# Patient Record
Sex: Male | Born: 1935 | Race: Black or African American | Hispanic: No | Smoking: Never smoker
Health system: Southern US, Community
[De-identification: ages and names within clinical notes are randomized; demographics above are authoritative.]

## PROBLEM LIST (undated history)

## (undated) DIAGNOSIS — N189 Chronic kidney disease, unspecified: Secondary | ICD-10-CM

## (undated) DIAGNOSIS — R809 Proteinuria, unspecified: Secondary | ICD-10-CM

## (undated) DIAGNOSIS — Z973 Presence of spectacles and contact lenses: Secondary | ICD-10-CM

## (undated) DIAGNOSIS — F0280 Dementia in other diseases classified elsewhere without behavioral disturbance: Secondary | ICD-10-CM

## (undated) DIAGNOSIS — Z87448 Personal history of other diseases of urinary system: Secondary | ICD-10-CM

## (undated) DIAGNOSIS — N19 Unspecified kidney failure: Secondary | ICD-10-CM

## (undated) DIAGNOSIS — R319 Hematuria, unspecified: Secondary | ICD-10-CM

## (undated) DIAGNOSIS — E785 Hyperlipidemia, unspecified: Secondary | ICD-10-CM

## (undated) DIAGNOSIS — I4891 Unspecified atrial fibrillation: Secondary | ICD-10-CM

## (undated) DIAGNOSIS — M1990 Unspecified osteoarthritis, unspecified site: Secondary | ICD-10-CM

## (undated) DIAGNOSIS — Z77090 Contact with and (suspected) exposure to asbestos: Secondary | ICD-10-CM

## (undated) DIAGNOSIS — Z8673 Personal history of transient ischemic attack (TIA), and cerebral infarction without residual deficits: Secondary | ICD-10-CM

## (undated) DIAGNOSIS — E7800 Pure hypercholesterolemia, unspecified: Secondary | ICD-10-CM

## (undated) DIAGNOSIS — R300 Dysuria: Secondary | ICD-10-CM

## (undated) DIAGNOSIS — Z86010 Personal history of colonic polyps: Secondary | ICD-10-CM

## (undated) DIAGNOSIS — Z860101 Personal history of adenomatous and serrated colon polyps: Secondary | ICD-10-CM

## (undated) DIAGNOSIS — D631 Anemia in chronic kidney disease: Secondary | ICD-10-CM

## (undated) DIAGNOSIS — Z8719 Personal history of other diseases of the digestive system: Secondary | ICD-10-CM

## (undated) DIAGNOSIS — N182 Chronic kidney disease, stage 2 (mild): Secondary | ICD-10-CM

## (undated) DIAGNOSIS — I10 Essential (primary) hypertension: Secondary | ICD-10-CM

## (undated) DIAGNOSIS — N2581 Secondary hyperparathyroidism of renal origin: Secondary | ICD-10-CM

## (undated) DIAGNOSIS — Z940 Kidney transplant status: Secondary | ICD-10-CM

## (undated) DIAGNOSIS — K5790 Diverticulosis of intestine, part unspecified, without perforation or abscess without bleeding: Secondary | ICD-10-CM

## (undated) DIAGNOSIS — Z85528 Personal history of other malignant neoplasm of kidney: Secondary | ICD-10-CM

## (undated) DIAGNOSIS — Z8619 Personal history of other infectious and parasitic diseases: Secondary | ICD-10-CM

## (undated) DIAGNOSIS — N433 Hydrocele, unspecified: Secondary | ICD-10-CM

## (undated) DIAGNOSIS — G309 Alzheimer's disease, unspecified: Secondary | ICD-10-CM

## (undated) HISTORY — DX: Pure hypercholesterolemia, unspecified: E78.00

## (undated) HISTORY — DX: Alzheimer's disease, unspecified: G30.9

## (undated) HISTORY — DX: Dementia in other diseases classified elsewhere without behavioral disturbance: F02.80

## (undated) HISTORY — PX: KIDNEY TRANSPLANT: SHX239

## (undated) HISTORY — DX: Unspecified kidney failure: N19

## (undated) HISTORY — DX: Unspecified atrial fibrillation: I48.91

---

## 1993-09-11 HISTORY — PX: HEMICOLECTOMY: SHX854

## 1998-01-19 ENCOUNTER — Other Ambulatory Visit: Admission: RE | Admit: 1998-01-19 | Discharge: 1998-01-19 | Payer: Self-pay | Admitting: *Deleted

## 1998-04-05 ENCOUNTER — Other Ambulatory Visit: Admission: RE | Admit: 1998-04-05 | Discharge: 1998-04-05 | Payer: Self-pay | Admitting: *Deleted

## 1998-06-21 ENCOUNTER — Ambulatory Visit (HOSPITAL_COMMUNITY): Admission: RE | Admit: 1998-06-21 | Discharge: 1998-06-21 | Payer: Self-pay | Admitting: Gastroenterology

## 1998-11-12 ENCOUNTER — Emergency Department (HOSPITAL_COMMUNITY): Admission: EM | Admit: 1998-11-12 | Discharge: 1998-11-12 | Payer: Self-pay | Admitting: Internal Medicine

## 1998-11-12 ENCOUNTER — Encounter: Payer: Self-pay | Admitting: Internal Medicine

## 1998-11-18 ENCOUNTER — Ambulatory Visit (HOSPITAL_BASED_OUTPATIENT_CLINIC_OR_DEPARTMENT_OTHER): Admission: RE | Admit: 1998-11-18 | Discharge: 1998-11-18 | Payer: Self-pay | Admitting: Orthopedic Surgery

## 1999-06-21 ENCOUNTER — Encounter: Payer: Self-pay | Admitting: Vascular Surgery

## 1999-06-21 ENCOUNTER — Ambulatory Visit (HOSPITAL_COMMUNITY): Admission: RE | Admit: 1999-06-21 | Discharge: 1999-06-21 | Payer: Self-pay | Admitting: Vascular Surgery

## 1999-07-18 ENCOUNTER — Encounter: Payer: Self-pay | Admitting: Emergency Medicine

## 1999-07-19 ENCOUNTER — Inpatient Hospital Stay (HOSPITAL_COMMUNITY): Admission: EM | Admit: 1999-07-19 | Discharge: 1999-07-20 | Payer: Self-pay | Admitting: Emergency Medicine

## 1999-07-19 ENCOUNTER — Encounter: Payer: Self-pay | Admitting: Emergency Medicine

## 1999-07-22 ENCOUNTER — Encounter (HOSPITAL_COMMUNITY): Admission: RE | Admit: 1999-07-22 | Discharge: 1999-10-20 | Payer: Self-pay | Admitting: Dentistry

## 2000-03-29 ENCOUNTER — Emergency Department (HOSPITAL_COMMUNITY): Admission: EM | Admit: 2000-03-29 | Discharge: 2000-03-29 | Payer: Self-pay | Admitting: *Deleted

## 2001-07-25 ENCOUNTER — Emergency Department (HOSPITAL_COMMUNITY): Admission: EM | Admit: 2001-07-25 | Discharge: 2001-07-25 | Payer: Self-pay | Admitting: Emergency Medicine

## 2001-07-25 ENCOUNTER — Encounter: Payer: Self-pay | Admitting: *Deleted

## 2001-10-13 ENCOUNTER — Encounter: Payer: Self-pay | Admitting: Emergency Medicine

## 2001-10-13 ENCOUNTER — Emergency Department (HOSPITAL_COMMUNITY): Admission: EM | Admit: 2001-10-13 | Discharge: 2001-10-13 | Payer: Self-pay | Admitting: Emergency Medicine

## 2001-10-18 ENCOUNTER — Ambulatory Visit (HOSPITAL_COMMUNITY): Admission: RE | Admit: 2001-10-18 | Discharge: 2001-10-18 | Payer: Self-pay | Admitting: Gastroenterology

## 2001-10-25 ENCOUNTER — Encounter (HOSPITAL_COMMUNITY): Admission: RE | Admit: 2001-10-25 | Discharge: 2002-01-23 | Payer: Self-pay | Admitting: Dentistry

## 2002-01-28 ENCOUNTER — Encounter (HOSPITAL_COMMUNITY): Admission: RE | Admit: 2002-01-28 | Discharge: 2002-04-28 | Payer: Self-pay | Admitting: Dentistry

## 2002-09-09 ENCOUNTER — Emergency Department (HOSPITAL_COMMUNITY): Admission: EM | Admit: 2002-09-09 | Discharge: 2002-09-09 | Payer: Self-pay | Admitting: Emergency Medicine

## 2003-05-23 ENCOUNTER — Encounter: Payer: Self-pay | Admitting: Emergency Medicine

## 2003-05-23 ENCOUNTER — Emergency Department (HOSPITAL_COMMUNITY): Admission: EM | Admit: 2003-05-23 | Discharge: 2003-05-23 | Payer: Self-pay | Admitting: Emergency Medicine

## 2005-08-30 ENCOUNTER — Inpatient Hospital Stay (HOSPITAL_COMMUNITY): Admission: AD | Admit: 2005-08-30 | Discharge: 2005-09-03 | Payer: Self-pay | Admitting: General Surgery

## 2005-08-30 ENCOUNTER — Encounter: Payer: Self-pay | Admitting: *Deleted

## 2006-06-12 ENCOUNTER — Emergency Department: Payer: Self-pay | Admitting: Emergency Medicine

## 2006-06-18 ENCOUNTER — Emergency Department: Payer: Self-pay | Admitting: Emergency Medicine

## 2008-04-26 ENCOUNTER — Emergency Department (HOSPITAL_COMMUNITY): Admission: EM | Admit: 2008-04-26 | Discharge: 2008-04-26 | Payer: Self-pay | Admitting: *Deleted

## 2011-01-27 NOTE — H&P (Signed)
[name] [name], [name] [name]                ACCOUNT NO.:  [id number]   MEDICAL RECORD NO.:  [id number]          PATIENT TYPE:  INP   LOCATION:  5508                         FACILITY:  [facility]   PHYSICIAN:  Edsel Petrin. Dalbert Batman, M.D.DATE OF BIRTH:  09-Jul-1928   DATE OF ADMISSION:  08/30/2005  DATE OF DISCHARGE:                                HISTORY & PHYSICAL   CHIEF COMPLAINT:  Abdominal pain and vomiting.   HISTORY OF PRESENT ILLNESS:  This is a 77 year old African-American man who  presents to the [facility] ED with a 1-day history of vomiting and mid and  lower abdominal pain. The pain started last night at 11 p.m. He was feeling  well before then. He has had a couple of bowel movements over the past 12  hours with no blood in it but he has mild diffuse pain.   He was evaluated by Dr. Mylinda Latina. Plain abdominal films were not  diagnostic but a CT scan suggested a distal small-bowel obstruction with a  little bit of interloop fluid. The radiologist suggested that the  obstruction was just proximal to the anastomosis between the ileum and the  tranverse colon in a patient who has had a right colectomy in the past.  There was no sign of adenopathy or malignancy. There is no sign of any  hernia detected. We were asked to see the patient after the CT scan was  done. I am evaluating the patient along with Dr. Justin Mend from the nephrology  service.   The patient is going to be admitted to [facility] because there are no  beds at [facility]. This has been arranged with Dr. Excell Seltzer who is on call for our surgery group at [facility].   PAST MEDICAL HISTORY:  1.  Status post renal transplant x3 in 1974 and 1980 with a functioning      transplant in the left iliac fossa.  2.  Status post bilateral nephrectomy for renal cell carcinoma.  3.  Hypertension.  4.  Status post right colectomy for villous adenoma in 1995.  5.  Status post colonoscopy in 2003 by Dr. Teena Irani with no  significant      findings.  6.  Hypertension.  7.  Hyperlipidemia.  8.  Chronic renal insufficiency; baseline creatinine 1.2.  9.  History of urinary tract infection with bladder stones, followed by Dr.      Desma Maxim at [facility].  10. Status post posterior circulation CVA followed by Dr. Rosiland Oz.  11. History of condylomata of the genital area with surgery by Dr. Lennie Hummer.   CURRENT MEDICATIONS:  1.  Neoral 125/100 b.i.d.  2.  Prednisone 5 mg daily.  3.  Imuran 150 mg daily.  4.  Aspirin 81 mg daily.  5.  Lasix 40 mg daily.  6.  Cozaar 50 mg daily.  7.  Caduet 5/20 mg daily.   ALLERGIES:  1.  CIPRO.  2.  SEPTRA.  3.  LEVAQUIN.  4.  CLEOCIN.   REVIEW OF SYSTEMS:  Systems reviewed. They are  noncontributory except as  described above.   SOCIAL HISTORY:  He is a widower. No tobacco or alcohol use. Three children.   FAMILY HISTORY:  No history of renal disease in other members of the family.   PHYSICAL EXAMINATION:  GENERAL:  Elderly, thin, pleasant, cooperative black  man in mild distress. He does not appear toxic.  VITAL SIGNS:  Blood pressure 134/86, respirations 20, pulse 70, temperature  98.3.  HEENT:  Eyes:  Sclerae clear, extraocular movements intact. Ear, nose,  mouth, and throat:  Dry mucous membranes, no acute lesions.  HEART:  Regular rate and rhythm, no murmurs.  LUNGS:  Clear to auscultation, no chest wall tenderness.  ABDOMEN:  He has multiple scars including a midline scar and bilateral  oblique scars in the right lower quadrant and left lower quadrant. He does  not appear visibly distended. I do not feel a hernia in the abdomen or the  groin, although he does have a large right hydrocele. Bowel sounds are  present but are diminished. He has mild diffuse tenderness. No peritoneal  signs.  EXTREMITIES:  He does move all four extremities. No edema.  NEUROLOGIC:  No gross motor or sensory deficits.   ADMISSION DATA:  CT scan as described above. White  blood cell count 10,000.  Differential 79 neutrophils, 14 lymphocytes. Hemoglobin 14.9. Liver function  tests normal. BUN 21, creatinine 1.3, sodium 133, carbon dioxide 28.  Urinalysis unremarkable.   ASSESSMENT:  1.  Small-bowel obstruction, presumably secondary to adhesions from his      multiple prior surgeries.  2.  End-stage renal disease, status post bilateral renal transplants with      functioning transplant left iliac fossa and stable renal function.  3.  Status post right colectomy for villous adenoma.  4.  Status post bilateral nephrectomies.  5.  Hypertension.  6.  Status post cerebrovascular accident.   PLAN:  1.  The patient will be admitted to [facility] under the general surgery      service with the renal service following.  2.  Insert NG tube for decompression.  3.  We will repeat labs and x-ray in the morning. If he does not improve, he      may require laparotomy within the next 24 hours.      Edsel Petrin. Dalbert Batman, M.D.  Electronically Signed     HMI/MEDQ  D:  08/30/2005  T:  08/31/2005  Job:  [id number]   cc:   Maudie Flakes. Hassell Done, M.D.  Fax: [phone]   Elyse Jarvis. Amedeo Plenty, M.D.  Fax: [phone]   Sherril Croon, M.D.  Fax: [phone]

## 2011-01-27 NOTE — Discharge Summary (Signed)
[name] [name]                ACCOUNT NO.:  [id number]   MEDICAL RECORD NO.:  [id number]          PATIENT TYPE:  INP   LOCATION:  5508                         FACILITY:  [facility]   PHYSICIAN:  Haywood Lasso, M.D.DATE OF BIRTH:  June 08, 1928   DATE OF ADMISSION:  08/30/2005  DATE OF DISCHARGE:  09/03/2005                                 DISCHARGE SUMMARY   DISCHARGE DIAGNOSES:  1.  Small-bowel obstruction, resolved.  2.  History of villous adenoma.  3.  History of right colectomy in 1995.  4.  History of bilateral native nephrectomies secondary to renal cell      carcinoma.  5.  History of renal transplants 1974, 1980.  6.  Hypertension.  7.  Hyperlipidemia.  8.  Multiple allergies to CIPRO, SEPTRA, [place] and CLEOMYCIN.   HOSPITAL COURSE:  [name] [name] is a 77 year old male who was initially seen  over at [facility] for 24-hour history of abdominal pain and  vomiting.  CT scan showed a distal small-bowel obstruction with intraloop  fluid findings.  The patient had an NG placed and then was transferred to  [facility].  Gradually over the next several days the  small-bowel obstruction improved.  While the patient was here, patient was  followed closely by the renal service who noted his significant history of  nephrectomies and transplants.  After approximately four days in the  hospital patient was feeling better and he was able to eat without problem.  His NG tube had been pulled and he was ready for discharge to home.  The  surgery team and the renal team both agreed the patient was ready for  discharge home and he was discharged in stable condition.   DISCHARGE MEDICATIONS:  1.  Neoral 100 mg two tablets once a day.  2.  Prednisone 5 mg daily.  3.  Imuran 150 mg  a day.  4.  Baby aspirin 81 mg a day.  5.  Cozaar 50 mg a day.  6.  Caduet 5/20 daily.  7.  Lasix 40 mg p.r.n.   He is to follow up with Dr. Hassell Done next month at his regular  appointment.  He  is to limit salt to 4 g a day and he is to advance his diet gradually over  the next several days.      Joesphine Bare, P.A.      Haywood Lasso, M.D.  Electronically Signed    LB/MEDQ  D:  10/25/2005  T:  10/25/2005  Job:  [id number]

## 2011-01-27 NOTE — Consult Note (Signed)
NAMECHANDARA, RORRER NO.:  [id number]   MEDICAL RECORD NO.:  [id number]          PATIENT TYPE:  EMS   LOCATION:  ED                           FACILITY:  [facility]   PHYSICIAN:  Sherril Croon, M.D.   DATE OF BIRTH:  08-23-1928   DATE OF CONSULTATION:  08/30/2005  DATE OF DISCHARGE:                                   CONSULTATION   REFERRING PHYSICIAN:  Dr. Vern Claude. Prudence Davidson.   REASON FOR CONSULTATION:  This 77 year old African American male, status  post renal transplantation x2 in 1974 and 1980, presented with a 1-day  history of vomiting x1 last night and 2 this morning with bilious emesis,  non-blood-stained, and diffuse right crampy lower abdominal pain.  Last  bowel movement was approximately 8 o'clock yesterday evening, constipated,  hard stood, denied any blood per rectum.  He has a history of decreased  appetite with weight loss over the past 6 months, history of villous  adenoma, right-sided hemicolectomy in '95 with screening colonoscopy  performed by Dr. Amedeo Plenty, scheduled for further colonoscopy in 2008, history  of renal cell cancer, status post bilateral native nephrectomies, history of  renal transplantation in 1974 and 1980.   PAST MEDICAL HISTORY:  1.  Status post renal transplant x3 in 1974 and 1980.  2.  History of bilateral nephrectomies for renal cell cancer.  3.  History of hypertension.  4.  History of hyperlipidemia.  5.  History of chronic renal failure, baseline serum creatinine 1.2.  6.  History of UTI and bladder stones, followed by Dr. Desma Maxim.  7.  History of posterior circulation strokes, followed by Dr. Rosiland Oz.  8.  History of anal condylomata, followed by Dr. Lennie Hummer.   MEDICATIONS:  1.  Neoral 125/100 mg twice daily.  2.  Prednisone 5 mg daily.  3.  Imuran 150 mg daily.  4.  Aspirin 81 mg daily.  5.  Lasix 40 mg daily.  6.  Cozaar 50 mg daily.  7.  Caduet 5/20 mg daily.   ALLERGIES:  CIPRO, SEPTRA, LEVAQUIN, BLEOMYCIN.   REVIEW OF SYSTEMS:  GENERAL:  Denies fatigue, fevers, sweats or chills.  EYES:  No visual complaints.  EARS, NOSE, MOUTH AND THROAT:  Denies hearing  loss, sore throat or runny nose.  CARDIOVASCULAR:  No anginal chest pain.  No history of myocardial infarction or congestive heart failure.  No  orthopnea.  No ankle or leg swelling, syncope or spells of palpitations.  RESPIRATORY:  No cough, wheezing or hemoptysis.  ABDOMINAL SYSTEM:  As per  HPI.  UROGENITAL:  No urgency, frequency or dysuria.  History of renal  calculi.  NEUROLOGIC:  History of posterior circulation stroke.  No history  of seizures.  No history of numbness, tingling or paresthesias in the upper  and lower extremities.  No focal complaints.  MUSCULOSKELETAL:  No [place] of  nonsteroidal anti-inflammatory drugs.  Has DJD in the upper extremities.  DERMATOLOGIC:  Denies any skin rashes or itching.   SOCIAL HISTORY:  Widowed.  No tobacco.  No alcohol.  Three children.   FAMILY  HISTORY:  No end-stage renal disease.   PHYSICAL EXAMINATION:  GENERAL:  Alert, nontoxic-appearing.  VITAL SIGNS:  Blood pressure 120/80, pulse 70, temperature 98.3, saturations  were 100%.  HEENT:  Head and eyes pale.  Extraocular movements intact.  Pupils round,  equal, reactive.  Ears, nose, mouth and throat:  General appearance was  normal.  Nasal mucosa clear.  Oropharynx was clear.  NECK:  Supple.  No JVD, no thyromegaly, no adenopathy.  CARDIOVASCULAR:  No heaves, thrills, rubs or gallops, regular rate and  rhythm.  RESPIRATORY:  Clear to auscultation, no wheeze or rales.  ABDOMEN:  Diffuse tenderness.  No rebound or guarding.  Bowel sounds high-  pitched.  GENITAL:  Scrotal evaluation reveals some swelling which was consistent with  hydrocele.  Spermatic cord easily palpable.  RECTAL:  Heme-negative.  EXTREMITIES:  No cyanosis, clubbing or edema.  Peripheral pulses 2+ and  symmetric, right and left dorsalis pedis.   LABORATORY DATA:   Urinalysis negative.  WBC 10,000, hemoglobin 14.9,  platelets 212,000.  Sodium 133, potassium 3.7, chloride 101, CO2 28, BUN 21,  creatinine 1.3, glucose 138, calcium 8.8.   X-RAYS:  COPD on chest x-ray, bibasilar atelectasis, nonspecific bowel  loops, no perforation.   CT scan revealed obstruction.   ASSESSMENT AND PLAN:  1.  Status post renal transplantation __________ allograft function.  2.  History of depression.  Continue prednisone 50 mg and hydrocortisone q.8      h., hold off on Imuran and Neoral.  Continue to be n.p.o.  Use suction.      If still nothing-by-mouth in 24 hours, give dose of Xopenex in the      morning, 1.5 mg/kg, maximum dose of 150 mg intravenously if still      nothing-by-mouth in 1-2 days.  3.  Hypertension/volume:  Continue intravenous fluids of D-5 1/2 normal      saline at 100 mL/hr.  I will follow renal panel and electrolytes.  4.  Small-bowel obstruction:  I appreciate help from Dr. Fanny Skates.      Sherril Croon, M.D.  Electronically Signed     MWW/MEDQ  D:  08/30/2005  T:  09/01/2005  Job:  [id number]

## 2011-11-07 DIAGNOSIS — Z940 Kidney transplant status: Secondary | ICD-10-CM | POA: Diagnosis not present

## 2011-11-07 DIAGNOSIS — Z79899 Other long term (current) drug therapy: Secondary | ICD-10-CM | POA: Diagnosis not present

## 2011-11-07 DIAGNOSIS — E785 Hyperlipidemia, unspecified: Secondary | ICD-10-CM | POA: Diagnosis not present

## 2011-11-16 DIAGNOSIS — Z940 Kidney transplant status: Secondary | ICD-10-CM | POA: Diagnosis not present

## 2011-11-16 DIAGNOSIS — I10 Essential (primary) hypertension: Secondary | ICD-10-CM | POA: Diagnosis not present

## 2012-05-15 DIAGNOSIS — Z79899 Other long term (current) drug therapy: Secondary | ICD-10-CM | POA: Diagnosis not present

## 2012-05-15 DIAGNOSIS — Z940 Kidney transplant status: Secondary | ICD-10-CM | POA: Diagnosis not present

## 2012-05-15 DIAGNOSIS — Z125 Encounter for screening for malignant neoplasm of prostate: Secondary | ICD-10-CM | POA: Diagnosis not present

## 2012-05-15 DIAGNOSIS — E785 Hyperlipidemia, unspecified: Secondary | ICD-10-CM | POA: Diagnosis not present

## 2012-05-23 DIAGNOSIS — I120 Hypertensive chronic kidney disease with stage 5 chronic kidney disease or end stage renal disease: Secondary | ICD-10-CM | POA: Diagnosis not present

## 2012-06-26 DIAGNOSIS — Z940 Kidney transplant status: Secondary | ICD-10-CM | POA: Diagnosis not present

## 2012-06-26 DIAGNOSIS — Z23 Encounter for immunization: Secondary | ICD-10-CM | POA: Diagnosis not present

## 2012-06-26 DIAGNOSIS — I129 Hypertensive chronic kidney disease with stage 1 through stage 4 chronic kidney disease, or unspecified chronic kidney disease: Secondary | ICD-10-CM | POA: Diagnosis not present

## 2012-06-26 DIAGNOSIS — R609 Edema, unspecified: Secondary | ICD-10-CM | POA: Diagnosis not present

## 2012-08-02 DIAGNOSIS — Z940 Kidney transplant status: Secondary | ICD-10-CM | POA: Diagnosis not present

## 2012-08-02 DIAGNOSIS — H3530 Unspecified macular degeneration: Secondary | ICD-10-CM | POA: Diagnosis not present

## 2012-08-02 DIAGNOSIS — H2510 Age-related nuclear cataract, unspecified eye: Secondary | ICD-10-CM | POA: Diagnosis not present

## 2012-08-12 DIAGNOSIS — Z940 Kidney transplant status: Secondary | ICD-10-CM | POA: Diagnosis not present

## 2012-08-12 DIAGNOSIS — Z79899 Other long term (current) drug therapy: Secondary | ICD-10-CM | POA: Diagnosis not present

## 2012-08-12 DIAGNOSIS — I10 Essential (primary) hypertension: Secondary | ICD-10-CM | POA: Diagnosis not present

## 2012-08-12 DIAGNOSIS — D126 Benign neoplasm of colon, unspecified: Secondary | ICD-10-CM | POA: Diagnosis not present

## 2012-08-14 DIAGNOSIS — Z940 Kidney transplant status: Secondary | ICD-10-CM | POA: Diagnosis not present

## 2012-08-14 DIAGNOSIS — Z7901 Long term (current) use of anticoagulants: Secondary | ICD-10-CM | POA: Diagnosis not present

## 2012-08-23 DIAGNOSIS — Z79899 Other long term (current) drug therapy: Secondary | ICD-10-CM | POA: Diagnosis not present

## 2012-08-26 DIAGNOSIS — R809 Proteinuria, unspecified: Secondary | ICD-10-CM | POA: Diagnosis not present

## 2012-08-26 DIAGNOSIS — Z86010 Personal history of colonic polyps: Secondary | ICD-10-CM | POA: Diagnosis not present

## 2012-09-17 DIAGNOSIS — D126 Benign neoplasm of colon, unspecified: Secondary | ICD-10-CM | POA: Diagnosis not present

## 2012-09-17 DIAGNOSIS — K921 Melena: Secondary | ICD-10-CM | POA: Diagnosis not present

## 2012-09-26 DIAGNOSIS — Z79899 Other long term (current) drug therapy: Secondary | ICD-10-CM | POA: Diagnosis not present

## 2012-09-26 DIAGNOSIS — R7989 Other specified abnormal findings of blood chemistry: Secondary | ICD-10-CM | POA: Diagnosis not present

## 2012-09-26 DIAGNOSIS — R791 Abnormal coagulation profile: Secondary | ICD-10-CM | POA: Diagnosis not present

## 2012-09-26 DIAGNOSIS — Z940 Kidney transplant status: Secondary | ICD-10-CM | POA: Diagnosis not present

## 2012-09-26 DIAGNOSIS — N269 Renal sclerosis, unspecified: Secondary | ICD-10-CM | POA: Diagnosis not present

## 2012-09-26 DIAGNOSIS — Z48298 Encounter for aftercare following other organ transplant: Secondary | ICD-10-CM | POA: Diagnosis not present

## 2012-10-16 ENCOUNTER — Other Ambulatory Visit: Payer: Self-pay | Admitting: Gastroenterology

## 2012-10-17 ENCOUNTER — Encounter (HOSPITAL_COMMUNITY): Payer: Self-pay | Admitting: *Deleted

## 2012-10-17 ENCOUNTER — Ambulatory Visit (HOSPITAL_COMMUNITY)
Admission: RE | Admit: 2012-10-17 | Discharge: 2012-10-17 | Disposition: A | Discharge status: Home / Self Care | Payer: Medicare Other | Source: Ambulatory Visit | Attending: Gastroenterology | Admitting: Gastroenterology

## 2012-10-17 ENCOUNTER — Encounter (HOSPITAL_COMMUNITY)
Admission: RE | Disposition: A | Discharge status: Home / Self Care | Payer: Self-pay | Source: Ambulatory Visit | Attending: Gastroenterology

## 2012-10-17 DIAGNOSIS — D126 Benign neoplasm of colon, unspecified: Secondary | ICD-10-CM | POA: Diagnosis not present

## 2012-10-17 DIAGNOSIS — K648 Other hemorrhoids: Secondary | ICD-10-CM | POA: Diagnosis not present

## 2012-10-17 DIAGNOSIS — Z940 Kidney transplant status: Secondary | ICD-10-CM | POA: Diagnosis not present

## 2012-10-17 DIAGNOSIS — Z980 Intestinal bypass and anastomosis status: Secondary | ICD-10-CM | POA: Insufficient documentation

## 2012-10-17 DIAGNOSIS — K5730 Diverticulosis of large intestine without perforation or abscess without bleeding: Secondary | ICD-10-CM | POA: Diagnosis not present

## 2012-10-17 DIAGNOSIS — K644 Residual hemorrhoidal skin tags: Secondary | ICD-10-CM | POA: Diagnosis not present

## 2012-10-17 HISTORY — DX: Unspecified osteoarthritis, unspecified site: M19.90

## 2012-10-17 HISTORY — PX: COLONOSCOPY: SHX5424

## 2012-10-17 HISTORY — DX: Diverticulosis of intestine, part unspecified, without perforation or abscess without bleeding: K57.90

## 2012-10-17 SURGERY — COLONOSCOPY
Anesthesia: Moderate Sedation

## 2012-10-17 MED ORDER — DIPHENHYDRAMINE HCL 50 MG/ML IJ SOLN
INTRAMUSCULAR | Status: AC
  Filled 2012-10-17: qty 1

## 2012-10-17 MED ORDER — SPOT INK MARKER SYRINGE KIT
PACK | SUBMUCOSAL | Status: AC
  Filled 2012-10-17: qty 5

## 2012-10-17 MED ORDER — FENTANYL CITRATE 0.05 MG/ML IJ SOLN
INTRAMUSCULAR | Status: DC | PRN
  Administered 2012-10-17 (×4): 25 ug via INTRAVENOUS

## 2012-10-17 MED ORDER — MIDAZOLAM HCL 5 MG/5ML IJ SOLN
INTRAMUSCULAR | Status: DC | PRN
  Administered 2012-10-17 (×2): 2 mg via INTRAVENOUS
  Administered 2012-10-17: 1 mg via INTRAVENOUS

## 2012-10-17 MED ORDER — SODIUM CHLORIDE 0.9 % IV SOLN
INTRAVENOUS | Status: DC
  Administered 2012-10-17: 10:00:00 via INTRAVENOUS

## 2012-10-17 MED ORDER — MIDAZOLAM HCL 10 MG/2ML IJ SOLN
INTRAMUSCULAR | Status: AC
  Filled 2012-10-17: qty 4

## 2012-10-17 MED ORDER — FENTANYL CITRATE 0.05 MG/ML IJ SOLN
INTRAMUSCULAR | Status: AC
  Filled 2012-10-17: qty 4

## 2012-10-17 NOTE — Progress Notes (Signed)
12 ENDO CLIPS APPLIED TO POLYPECTOMY SITES,   LOT # [id number] C3  X9 applied    LOT # [id number] C3 X2A applied  LOT # [id number] C3  X 1 applied

## 2012-10-17 NOTE — Op Note (Signed)
[facility] [place], [address]   OPERATIVE PROCEDURE REPORT  PATIENT: Vaden, Matlick  MR#: [id number] BIRTHDATE: 06-27-1928  GENDER: Male ENDOSCOPIST: Carol Ada, MD ASSISTANT:   Luanne Bras, RN [place], technician PROCEDURE DATE: 10/17/2012 PROCEDURE:   Colonoscopy with snare polypectomy ASA CLASS:   Class III INDICATIONS:History of polyps. MEDICATIONS: Versed 5 mg IV and Fentanyl 100 mcg IV  DESCRIPTION OF PROCEDURE:   After the risks benefits and alternatives of the procedure were thoroughly explained, informed consent was obtained.  A digital rectal exam revealed no abnormalities of the rectum.    The Pentax Colonoscope [id number] endoscope was introduced through the anus  and advanced to the surgical anastomosis , No adverse events experienced.    The quality of the prep was excellent. .  The instrument was then slowly withdrawn as the colon was fully examined.     COLON FINDINGS: The ileocecal anastamosis was again identified. Around the hepatic flexure region of the transverse colon two large flat polyps were identified.  A couple of other smaller 3 mm sessile polyps were removed with a cold snare.  Both of the large polyps measured approximately 1.5-2 cm.  Submucosal injection with normal saline was used to lift the lesions.  The first polyp was removed in a piecemeal fashion in two passes.  The second polyp, which straddled a fold was removed in one pass, however, the resection was deep.  Both of the sites were closed with 5 and 6 hemoclips, respectively.  In the larger proximal polyp a total of 6 hemoclips were deployed, but one failed to hold.  Both polyps were injected with SPOT during and after the resection.  Scattered left sided diverticula were identified..   Retroflexed views revealed internal/external hemorrhoids.     The scope was then withdrawn from the patient and the procedure terminated.  COMPLICATIONS:  There were no complications.  IMPRESSION: 1) Polyps s/p hemoclipping. 2) Left sided diverticula.  RECOMMENDATIONS:1) Await biopsy results. 2) Repeat colonoscopy in one year.   _______________________________ eSigned:  Carol Ada, MD 10/17/2012 11:07 AM   PATIENT NAME:  [name] [name], [name] [name] MR#: [id number]

## 2012-10-17 NOTE — H&P (Signed)
Reason for Consult: Retained colonic polyp Referring Physician: Salem Senate, M.D.  [name] [name] HPI: This is an 79 year old gentleman who is s/p renal transplant who was recently identified to have colonic polyps.  A larger transverse colon polyp was identified and it was suspicious in its appearance.  Cold biopsies were negative for malignancy, but it was an adenoma.  He is here today to have the polyps removed.  No past medical history on file.  No past surgical history on file.  No family history on file.  Social History:  does not have a smoking history on file. He does not have any smokeless tobacco history on file. His alcohol and drug histories not on file.  Allergies:  Allergies  Allergen Reactions  . Ciprofloxacin Rash  . Clindamycin/Lincomycin   . Levaquin (Levofloxacin)   . Septra (Sulfamethoxazole W-Trimethoprim)     Medications: Scheduled:   Continuous:    No results found for this or any previous visit (from the past 24 hour(s)).   No results found.  ROS:  As stated above in the HPI otherwise negative.  There were no vitals taken for this visit.    PE: Gen: NAD, Alert and Oriented HEENT:  [place]/AT, EOMI Neck: Supple, no LAD Lungs: CTA Bilaterally CV: RRR without M/G/R ABM: Soft, NTND, +BS Ext: No C/C/E  Assessment/Plan: 1) Colonic polyp.  Plan: 1) Colonoscopy for polypectomy.  Mirtie Bastyr D 10/17/2012, 9:27 AM

## 2012-10-18 ENCOUNTER — Encounter (HOSPITAL_COMMUNITY): Payer: Self-pay | Admitting: Gastroenterology

## 2013-01-03 DIAGNOSIS — Z79899 Other long term (current) drug therapy: Secondary | ICD-10-CM | POA: Diagnosis not present

## 2013-01-09 DIAGNOSIS — I10 Essential (primary) hypertension: Secondary | ICD-10-CM | POA: Diagnosis not present

## 2013-01-09 DIAGNOSIS — Z940 Kidney transplant status: Secondary | ICD-10-CM | POA: Diagnosis not present

## 2013-01-09 DIAGNOSIS — N032 Chronic nephritic syndrome with diffuse membranous glomerulonephritis: Secondary | ICD-10-CM | POA: Diagnosis not present

## 2013-01-09 DIAGNOSIS — R809 Proteinuria, unspecified: Secondary | ICD-10-CM | POA: Diagnosis not present

## 2013-02-07 DIAGNOSIS — Z940 Kidney transplant status: Secondary | ICD-10-CM | POA: Diagnosis not present

## 2013-02-07 DIAGNOSIS — Z79899 Other long term (current) drug therapy: Secondary | ICD-10-CM | POA: Diagnosis not present

## 2013-02-13 DIAGNOSIS — Z940 Kidney transplant status: Secondary | ICD-10-CM | POA: Diagnosis not present

## 2013-02-21 DIAGNOSIS — I10 Essential (primary) hypertension: Secondary | ICD-10-CM | POA: Diagnosis not present

## 2013-02-21 DIAGNOSIS — Z940 Kidney transplant status: Secondary | ICD-10-CM | POA: Diagnosis not present

## 2013-02-21 DIAGNOSIS — R809 Proteinuria, unspecified: Secondary | ICD-10-CM | POA: Diagnosis not present

## 2013-03-19 DIAGNOSIS — Z79899 Other long term (current) drug therapy: Secondary | ICD-10-CM | POA: Diagnosis not present

## 2013-03-19 DIAGNOSIS — Z940 Kidney transplant status: Secondary | ICD-10-CM | POA: Diagnosis not present

## 2013-05-07 DIAGNOSIS — H2510 Age-related nuclear cataract, unspecified eye: Secondary | ICD-10-CM | POA: Diagnosis not present

## 2013-05-22 DIAGNOSIS — Z79899 Other long term (current) drug therapy: Secondary | ICD-10-CM | POA: Diagnosis not present

## 2013-05-22 DIAGNOSIS — Z940 Kidney transplant status: Secondary | ICD-10-CM | POA: Diagnosis not present

## 2013-06-06 DIAGNOSIS — R809 Proteinuria, unspecified: Secondary | ICD-10-CM | POA: Diagnosis not present

## 2013-06-06 DIAGNOSIS — N390 Urinary tract infection, site not specified: Secondary | ICD-10-CM | POA: Diagnosis not present

## 2013-06-06 DIAGNOSIS — Z23 Encounter for immunization: Secondary | ICD-10-CM | POA: Diagnosis not present

## 2013-06-06 DIAGNOSIS — I129 Hypertensive chronic kidney disease with stage 1 through stage 4 chronic kidney disease, or unspecified chronic kidney disease: Secondary | ICD-10-CM | POA: Diagnosis not present

## 2013-06-06 DIAGNOSIS — Z940 Kidney transplant status: Secondary | ICD-10-CM | POA: Diagnosis not present

## 2013-07-02 DIAGNOSIS — Z79899 Other long term (current) drug therapy: Secondary | ICD-10-CM | POA: Diagnosis not present

## 2013-07-02 DIAGNOSIS — E785 Hyperlipidemia, unspecified: Secondary | ICD-10-CM | POA: Diagnosis not present

## 2013-07-02 DIAGNOSIS — E213 Hyperparathyroidism, unspecified: Secondary | ICD-10-CM | POA: Diagnosis not present

## 2013-07-02 DIAGNOSIS — M109 Gout, unspecified: Secondary | ICD-10-CM | POA: Diagnosis not present

## 2013-07-02 DIAGNOSIS — Z940 Kidney transplant status: Secondary | ICD-10-CM | POA: Diagnosis not present

## 2013-09-15 DIAGNOSIS — I129 Hypertensive chronic kidney disease with stage 1 through stage 4 chronic kidney disease, or unspecified chronic kidney disease: Secondary | ICD-10-CM | POA: Diagnosis not present

## 2013-09-15 DIAGNOSIS — Z940 Kidney transplant status: Secondary | ICD-10-CM | POA: Diagnosis not present

## 2013-09-15 DIAGNOSIS — N390 Urinary tract infection, site not specified: Secondary | ICD-10-CM | POA: Diagnosis not present

## 2013-09-15 DIAGNOSIS — N182 Chronic kidney disease, stage 2 (mild): Secondary | ICD-10-CM | POA: Diagnosis not present

## 2013-09-15 DIAGNOSIS — R809 Proteinuria, unspecified: Secondary | ICD-10-CM | POA: Diagnosis not present

## 2013-10-10 DIAGNOSIS — R809 Proteinuria, unspecified: Secondary | ICD-10-CM | POA: Diagnosis not present

## 2013-10-10 DIAGNOSIS — Z940 Kidney transplant status: Secondary | ICD-10-CM | POA: Diagnosis not present

## 2013-10-10 DIAGNOSIS — N182 Chronic kidney disease, stage 2 (mild): Secondary | ICD-10-CM | POA: Diagnosis not present

## 2013-11-10 DIAGNOSIS — Z940 Kidney transplant status: Secondary | ICD-10-CM | POA: Diagnosis not present

## 2013-11-20 DIAGNOSIS — Z940 Kidney transplant status: Secondary | ICD-10-CM | POA: Diagnosis not present

## 2014-02-09 DIAGNOSIS — R809 Proteinuria, unspecified: Secondary | ICD-10-CM | POA: Diagnosis not present

## 2014-02-09 DIAGNOSIS — E785 Hyperlipidemia, unspecified: Secondary | ICD-10-CM | POA: Diagnosis not present

## 2014-02-09 DIAGNOSIS — Z940 Kidney transplant status: Secondary | ICD-10-CM | POA: Diagnosis not present

## 2014-02-09 DIAGNOSIS — R7989 Other specified abnormal findings of blood chemistry: Secondary | ICD-10-CM | POA: Diagnosis not present

## 2014-02-09 DIAGNOSIS — N182 Chronic kidney disease, stage 2 (mild): Secondary | ICD-10-CM | POA: Diagnosis not present

## 2014-02-09 DIAGNOSIS — N390 Urinary tract infection, site not specified: Secondary | ICD-10-CM | POA: Diagnosis not present

## 2014-02-09 DIAGNOSIS — I129 Hypertensive chronic kidney disease with stage 1 through stage 4 chronic kidney disease, or unspecified chronic kidney disease: Secondary | ICD-10-CM | POA: Diagnosis not present

## 2014-02-17 DIAGNOSIS — Z940 Kidney transplant status: Secondary | ICD-10-CM | POA: Diagnosis not present

## 2014-05-19 DIAGNOSIS — E785 Hyperlipidemia, unspecified: Secondary | ICD-10-CM | POA: Diagnosis not present

## 2014-05-19 DIAGNOSIS — I129 Hypertensive chronic kidney disease with stage 1 through stage 4 chronic kidney disease, or unspecified chronic kidney disease: Secondary | ICD-10-CM | POA: Diagnosis not present

## 2014-05-19 DIAGNOSIS — N390 Urinary tract infection, site not specified: Secondary | ICD-10-CM | POA: Diagnosis not present

## 2014-05-19 DIAGNOSIS — Z23 Encounter for immunization: Secondary | ICD-10-CM | POA: Diagnosis not present

## 2014-05-19 DIAGNOSIS — Z940 Kidney transplant status: Secondary | ICD-10-CM | POA: Diagnosis not present

## 2014-05-19 DIAGNOSIS — R809 Proteinuria, unspecified: Secondary | ICD-10-CM | POA: Diagnosis not present

## 2014-05-19 DIAGNOSIS — N182 Chronic kidney disease, stage 2 (mild): Secondary | ICD-10-CM | POA: Diagnosis not present

## 2014-08-10 DIAGNOSIS — E782 Mixed hyperlipidemia: Secondary | ICD-10-CM | POA: Diagnosis not present

## 2014-08-10 DIAGNOSIS — Z940 Kidney transplant status: Secondary | ICD-10-CM | POA: Diagnosis not present

## 2014-08-10 DIAGNOSIS — N182 Chronic kidney disease, stage 2 (mild): Secondary | ICD-10-CM | POA: Diagnosis not present

## 2014-08-10 DIAGNOSIS — R809 Proteinuria, unspecified: Secondary | ICD-10-CM | POA: Diagnosis not present

## 2014-08-10 DIAGNOSIS — I129 Hypertensive chronic kidney disease with stage 1 through stage 4 chronic kidney disease, or unspecified chronic kidney disease: Secondary | ICD-10-CM | POA: Diagnosis not present

## 2014-12-15 DIAGNOSIS — N182 Chronic kidney disease, stage 2 (mild): Secondary | ICD-10-CM | POA: Diagnosis not present

## 2014-12-15 DIAGNOSIS — Z940 Kidney transplant status: Secondary | ICD-10-CM | POA: Diagnosis not present

## 2014-12-15 DIAGNOSIS — I129 Hypertensive chronic kidney disease with stage 1 through stage 4 chronic kidney disease, or unspecified chronic kidney disease: Secondary | ICD-10-CM | POA: Diagnosis not present

## 2014-12-15 DIAGNOSIS — R809 Proteinuria, unspecified: Secondary | ICD-10-CM | POA: Diagnosis not present

## 2015-02-17 DIAGNOSIS — N390 Urinary tract infection, site not specified: Secondary | ICD-10-CM | POA: Diagnosis not present

## 2015-02-17 DIAGNOSIS — Z940 Kidney transplant status: Secondary | ICD-10-CM | POA: Diagnosis not present

## 2015-04-05 DIAGNOSIS — I129 Hypertensive chronic kidney disease with stage 1 through stage 4 chronic kidney disease, or unspecified chronic kidney disease: Secondary | ICD-10-CM | POA: Diagnosis not present

## 2015-04-05 DIAGNOSIS — R809 Proteinuria, unspecified: Secondary | ICD-10-CM | POA: Diagnosis not present

## 2015-04-05 DIAGNOSIS — E782 Mixed hyperlipidemia: Secondary | ICD-10-CM | POA: Diagnosis not present

## 2015-04-05 DIAGNOSIS — Z940 Kidney transplant status: Secondary | ICD-10-CM | POA: Diagnosis not present

## 2015-04-05 DIAGNOSIS — N182 Chronic kidney disease, stage 2 (mild): Secondary | ICD-10-CM | POA: Diagnosis not present

## 2015-04-26 DIAGNOSIS — R319 Hematuria, unspecified: Secondary | ICD-10-CM | POA: Diagnosis not present

## 2015-04-26 DIAGNOSIS — N182 Chronic kidney disease, stage 2 (mild): Secondary | ICD-10-CM | POA: Diagnosis not present

## 2015-06-02 DIAGNOSIS — R809 Proteinuria, unspecified: Secondary | ICD-10-CM | POA: Diagnosis not present

## 2015-06-02 DIAGNOSIS — N390 Urinary tract infection, site not specified: Secondary | ICD-10-CM | POA: Diagnosis not present

## 2015-06-02 DIAGNOSIS — Z23 Encounter for immunization: Secondary | ICD-10-CM | POA: Diagnosis not present

## 2015-06-02 DIAGNOSIS — I129 Hypertensive chronic kidney disease with stage 1 through stage 4 chronic kidney disease, or unspecified chronic kidney disease: Secondary | ICD-10-CM | POA: Diagnosis not present

## 2015-06-02 DIAGNOSIS — Z940 Kidney transplant status: Secondary | ICD-10-CM | POA: Diagnosis not present

## 2015-06-02 DIAGNOSIS — N182 Chronic kidney disease, stage 2 (mild): Secondary | ICD-10-CM | POA: Diagnosis not present

## 2015-06-29 DIAGNOSIS — Z940 Kidney transplant status: Secondary | ICD-10-CM | POA: Diagnosis not present

## 2015-06-29 DIAGNOSIS — N182 Chronic kidney disease, stage 2 (mild): Secondary | ICD-10-CM | POA: Diagnosis not present

## 2015-06-29 DIAGNOSIS — R809 Proteinuria, unspecified: Secondary | ICD-10-CM | POA: Diagnosis not present

## 2015-06-29 DIAGNOSIS — I129 Hypertensive chronic kidney disease with stage 1 through stage 4 chronic kidney disease, or unspecified chronic kidney disease: Secondary | ICD-10-CM | POA: Diagnosis not present

## 2015-06-30 DIAGNOSIS — N433 Hydrocele, unspecified: Secondary | ICD-10-CM | POA: Diagnosis not present

## 2015-06-30 DIAGNOSIS — R351 Nocturia: Secondary | ICD-10-CM | POA: Diagnosis not present

## 2015-06-30 DIAGNOSIS — R3121 Asymptomatic microscopic hematuria: Secondary | ICD-10-CM | POA: Diagnosis not present

## 2015-06-30 DIAGNOSIS — N3941 Urge incontinence: Secondary | ICD-10-CM | POA: Diagnosis not present

## 2015-07-05 ENCOUNTER — Other Ambulatory Visit: Payer: Self-pay | Admitting: Urology

## 2015-07-06 DIAGNOSIS — Z940 Kidney transplant status: Secondary | ICD-10-CM | POA: Diagnosis not present

## 2015-07-08 ENCOUNTER — Encounter (HOSPITAL_BASED_OUTPATIENT_CLINIC_OR_DEPARTMENT_OTHER): Payer: Self-pay | Admitting: *Deleted

## 2015-07-08 ENCOUNTER — Other Ambulatory Visit: Payer: Self-pay | Admitting: Urology

## 2015-07-09 ENCOUNTER — Encounter (HOSPITAL_BASED_OUTPATIENT_CLINIC_OR_DEPARTMENT_OTHER): Payer: Self-pay | Admitting: *Deleted

## 2015-07-09 NOTE — Progress Notes (Signed)
SPOKE W/ DAUGHTER, SARITA. PT POOR HISTORIAN.  NPO AFTER MN.  WILL TAKE PREDNISONE AND CYCLOSPORIN AT 0800 W/ SIPS OF WATER AM DOS AS PER INSTRUCTIONS FROM DR DETERDING.  ARRIVE AT 0830.  CURRENT LAB RESULTS TO BE FAXED FROM [facility] AND LOV NOTE AND LAST EKG.

## 2015-07-12 ENCOUNTER — Encounter (HOSPITAL_BASED_OUTPATIENT_CLINIC_OR_DEPARTMENT_OTHER): Payer: Self-pay | Admitting: *Deleted

## 2015-07-12 NOTE — H&P (Signed)
Active Problems Problems  1. Asymptomatic microscopic hematuria (R31.21) 2. 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 Nocturia (R35.1) 4. Urge incontinence of urine (N39.41)  History of Present Illness [name] is an 81 yo BM who is sent in consultation by Dr. Jimmy Footman for bilateral hydroceles that has been present for several months.  He has nocturia x 8-9. He has urgency with some UUI. He has a variable stream and he doesn't feel like he empties his bladder.  He has had no gross hematuria or dysuria but has 10-20 RBC's on UA today. He has a history of ESRD with 2 prior renal transplants with the last in 1994 in the left iliac. He has had renal cancer with bilateral native nephrectomies. It is still functional. He has UTI's in the past.   Past Medical History Problems  1. History of Anxiety (F41.9) 2. History of arthritis (Z87.39) 3. History of chronic kidney disease (Z87.448) 4. History of depression (Z86.59) 5. History of hypercholesterolemia (Z86.39) 6. History of hypertension (Z86.79) 7. History of malignant neoplasm (Z85.9) 8. History of renal failure (Z87.448) 9. History of transient cerebral ischemia (Z86.73) 10. History of Renal cell carcinoma (C64.9)  Surgical History Problems  1. History of Bilateral Nephrectomy 2. History of Partial Colectomy 3. History of Renal Transplant  Current Meds 1. Aspirin 81 MG TABS;  Therapy: (Recorded:19Oct2016) to Recorded 2. Atorvastatin Calcium 40 MG Oral Tablet;  Therapy: (Recorded:19Oct2016) to Recorded 3. Furosemide 40 MG Oral Tablet;  Therapy: (Recorded:19Oct2016) to Recorded 4. Losartan Potassium 100 MG Oral Tablet;  Therapy: (Recorded:19Oct2016) to Recorded 5. Myfortic 180 MG Oral Tablet Delayed Release;  Therapy: (Recorded:19Oct2016) to Recorded 6. Neoral 100 MG Oral Capsule;  Therapy: (Recorded:19Oct2016) to Recorded 7. PredniSONE 5 MG Oral Tablet;  Therapy: (Recorded:19Oct2016) to Recorded  Allergies Medication  1. Cipro  TABS 2. clindamycin 3. Levaquin TABS 4. Sulfa Drugs 5. Zestril TABS  Family History Problems  1. Family history of hypertension (Z82.49) : Mother  Social History Problems    Father deceased   Mother deceased   No alcohol use   No caffeine use   Retired   Two children   Widowed  Review of Systems Genitourinary, constitutional, skin, eye, otolaryngeal, hematologic/lymphatic, cardiovascular, pulmonary, endocrine, musculoskeletal, gastrointestinal, neurological and psychiatric system(s) were reviewed and pertinent findings if present are noted and are otherwise negative.  Genitourinary: urinary frequency, feelings of urinary urgency, dysuria, nocturia and incontinence.  Constitutional: night sweats, feeling tired (fatigue) and recent weight loss.  Integumentary: pruritus.  Eyes: blurred vision and diplopia.  ENT: sore throat.  Cardiovascular: leg swelling.  Respiratory: shortness of breath and cough.  Endocrine: polydipsia.  Musculoskeletal: back pain and joint pain.  Neurological: headache and dizziness.  Psychiatric: anxiety and depression.    Vitals Vital Signs [Data Includes: Last 1 Day]  Recorded: 19Oct2016 02:32PM  Height: 6 ft 2 in Blood Pressure: 121 / 85 Temperature: 96 F Heart Rate: 78  Physical Exam Constitutional: well developed . No acute distress. Very thin.  ENT:. The ears and nose are normal in appearance.  Neck: The appearance of the neck is normal and no neck mass is present.  Pulmonary: No respiratory distress and normal respiratory rhythm and effort.  Cardiovascular: Heart rate and rhythm are normal . No obvious murmurs are appreciated. mild ankle edema  Abdomen: Incision site(s) well healed. The abdomen is flat. The abdomen is soft and nontender. No masses are palpated. No CVA tenderness. No hernias are palpable. No hepatosplenomegaly noted.  Rectal: Rectal exam demonstrates normal sphincter  tone, no tenderness and no masses. The prostate is  smooth and flat. The prostate has no nodularity and is not tender. The left seminal vesicle is nonpalpable. The right seminal vesicle is nonpalpable. The perineum is normal on inspection.  Genitourinary: He has an uncircumcised phallus with a massive right hydrocele that is the size of a large cantelope. The right testicle is non-palpable. He may have a small left hydrocele but the testicle is readily palpable and normal with no epididymal lesions.  Lymphatics: The supraclavicular, femoral and inguinal nodes are not enlarged or tender.  Skin: Normal skin turgor, no visible rash and no visible skin lesions.  Neuro/Psych:. Mood and affect are appropriate. Normal sensation of the perineum/perianal region (S3,4,5).    Results/Data Urine [Data Includes: Last 1 Day]   [id number]  COLOR YELLOW   APPEARANCE CLEAR   SPECIFIC GRAVITY 1.020   pH 6.0   GLUCOSE NEGATIVE   BILIRUBIN NEGATIVE   KETONE NEGATIVE   BLOOD 2+   PROTEIN 2+   NITRITE NEGATIVE   LEUKOCYTE ESTERASE NEGATIVE   SQUAMOUS EPITHELIAL/HPF 0-5 HPF  WBC 0-5 WBC/HPF  RBC 10-20 RBC/HPF  BACTERIA NONE SEEN HPF  CRYSTALS NONE SEEN HPF  CASTS NONE SEEN LPF  Yeast NONE SEEN HPF   Old records or history reviewed: REcords from Dr. Jimmy Footman reviewed.  The following images/tracing/specimen were independently visualized:  Scrotal US was done but was complicated by the massive size of the right cystic structure. The right testicle is 4.7 x 2.4 x 4.5cm with a 19mm cyst in the lower pole. There is a massive complex hydrocele with internal septation. The left testicle is difficult to visualize but appears to be 4.8 x 2.1 x 4.3cm. Internal blood flow is not clearly identified. There is fluid around the testicle but it is elongated and difficult to measure. The elongation appears to be from compression from the right side.  The following clinical lab reports were reviewed:  UA reviewed.    Assessment Assessed  1. Asymptomatic microscopic hematuria  (R31.21) 2. 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 Nocturia (R35.1) 4. Urge incontinence of urine (N39.41)  He has a massive complex right hydrocele and a smaller left hydrocele.  He has microhematuria and urgency with nocturia.   Plan Health Maintenance  1. UA With REFLEX; [Do Not Release]; Status:Resulted - Requires Verification;   [id number] 02:11PM Hydrocele of testis  2. Follow-up Schedule Surgery Office  Follow-up  Status: Complete  Done: [id number]. SCROTAL U/S; Status:Resulted - Requires Verification;   [id number] 03:57PM  I am going to get him set up for bilateral hydrocelectomy and cystoscopy.  I reviewed the risks of bleeding, infection, recurrence, testicular injury or loss, persistent swelling, thrombotic events and anesthetic risks.   Discussion/Summary CC: Dr. Clair Gulling Deterding.

## 2015-07-13 ENCOUNTER — Ambulatory Visit (HOSPITAL_BASED_OUTPATIENT_CLINIC_OR_DEPARTMENT_OTHER): Payer: Medicare Other | Admitting: Anesthesiology

## 2015-07-13 ENCOUNTER — Encounter (HOSPITAL_BASED_OUTPATIENT_CLINIC_OR_DEPARTMENT_OTHER)
Admission: RE | Disposition: A | Discharge status: Home / Self Care | Payer: Self-pay | Source: Ambulatory Visit | Attending: Urology

## 2015-07-13 ENCOUNTER — Ambulatory Visit (HOSPITAL_BASED_OUTPATIENT_CLINIC_OR_DEPARTMENT_OTHER)
Admission: RE | Admit: 2015-07-13 | Discharge: 2015-07-13 | Disposition: A | Discharge status: Home / Self Care | Payer: Medicare Other | Source: Ambulatory Visit | Attending: Urology | Admitting: Urology

## 2015-07-13 ENCOUNTER — Encounter (HOSPITAL_BASED_OUTPATIENT_CLINIC_OR_DEPARTMENT_OTHER): Payer: Self-pay | Admitting: *Deleted

## 2015-07-13 DIAGNOSIS — R351 Nocturia: Secondary | ICD-10-CM | POA: Diagnosis not present

## 2015-07-13 DIAGNOSIS — Z881 Allergy status to other antibiotic agents status: Secondary | ICD-10-CM | POA: Diagnosis not present

## 2015-07-13 DIAGNOSIS — Z7982 Long term (current) use of aspirin: Secondary | ICD-10-CM | POA: Diagnosis not present

## 2015-07-13 DIAGNOSIS — N210 Calculus in bladder: Secondary | ICD-10-CM | POA: Diagnosis not present

## 2015-07-13 DIAGNOSIS — E7800 Pure hypercholesterolemia, unspecified: Secondary | ICD-10-CM | POA: Diagnosis not present

## 2015-07-13 DIAGNOSIS — N323 Diverticulum of bladder: Secondary | ICD-10-CM | POA: Diagnosis not present

## 2015-07-13 DIAGNOSIS — R3129 Other microscopic hematuria: Secondary | ICD-10-CM | POA: Insufficient documentation

## 2015-07-13 DIAGNOSIS — M1990 Unspecified osteoarthritis, unspecified site: Secondary | ICD-10-CM | POA: Insufficient documentation

## 2015-07-13 DIAGNOSIS — N3941 Urge incontinence: Secondary | ICD-10-CM | POA: Diagnosis not present

## 2015-07-13 DIAGNOSIS — F329 Major depressive disorder, single episode, unspecified: Secondary | ICD-10-CM | POA: Insufficient documentation

## 2015-07-13 DIAGNOSIS — Z79899 Other long term (current) drug therapy: Secondary | ICD-10-CM | POA: Insufficient documentation

## 2015-07-13 DIAGNOSIS — F419 Anxiety disorder, unspecified: Secondary | ICD-10-CM | POA: Insufficient documentation

## 2015-07-13 DIAGNOSIS — Z940 Kidney transplant status: Secondary | ICD-10-CM | POA: Diagnosis not present

## 2015-07-13 DIAGNOSIS — I129 Hypertensive chronic kidney disease with stage 1 through stage 4 chronic kidney disease, or unspecified chronic kidney disease: Secondary | ICD-10-CM | POA: Diagnosis not present

## 2015-07-13 DIAGNOSIS — N186 End stage renal disease: Secondary | ICD-10-CM | POA: Insufficient documentation

## 2015-07-13 DIAGNOSIS — N3289 Other specified disorders of bladder: Secondary | ICD-10-CM | POA: Diagnosis not present

## 2015-07-13 DIAGNOSIS — Z888 Allergy status to other drugs, medicaments and biological substances status: Secondary | ICD-10-CM | POA: Diagnosis not present

## 2015-07-13 DIAGNOSIS — I120 Hypertensive chronic kidney disease with stage 5 chronic kidney disease or end stage renal disease: Secondary | ICD-10-CM | POA: Insufficient documentation

## 2015-07-13 DIAGNOSIS — Z8553 Personal history of malignant neoplasm of renal pelvis: Secondary | ICD-10-CM | POA: Insufficient documentation

## 2015-07-13 DIAGNOSIS — N433 Hydrocele, unspecified: Secondary | ICD-10-CM | POA: Diagnosis not present

## 2015-07-13 DIAGNOSIS — Z8673 Personal history of transient ischemic attack (TIA), and cerebral infarction without residual deficits: Secondary | ICD-10-CM | POA: Diagnosis not present

## 2015-07-13 DIAGNOSIS — D2921 Benign neoplasm of right testis: Secondary | ICD-10-CM | POA: Diagnosis not present

## 2015-07-13 DIAGNOSIS — N182 Chronic kidney disease, stage 2 (mild): Secondary | ICD-10-CM | POA: Diagnosis not present

## 2015-07-13 DIAGNOSIS — R319 Hematuria, unspecified: Secondary | ICD-10-CM | POA: Diagnosis not present

## 2015-07-13 HISTORY — DX: Proteinuria, unspecified: R80.9

## 2015-07-13 HISTORY — DX: Personal history of transient ischemic attack (TIA), and cerebral infarction without residual deficits: Z86.73

## 2015-07-13 HISTORY — DX: Chronic kidney disease, unspecified: N18.9

## 2015-07-13 HISTORY — DX: Personal history of colonic polyps: Z86.010

## 2015-07-13 HISTORY — PX: CYSTOSCOPY: SHX5120

## 2015-07-13 HISTORY — DX: Personal history of other diseases of urinary system: Z87.448

## 2015-07-13 HISTORY — DX: Kidney transplant status: Z94.0

## 2015-07-13 HISTORY — DX: Secondary hyperparathyroidism of renal origin: N25.81

## 2015-07-13 HISTORY — DX: Presence of spectacles and contact lenses: Z97.3

## 2015-07-13 HISTORY — DX: Contact with and (suspected) exposure to asbestos: Z77.090

## 2015-07-13 HISTORY — DX: Personal history of other infectious and parasitic diseases: Z86.19

## 2015-07-13 HISTORY — DX: Hematuria, unspecified: R31.9

## 2015-07-13 HISTORY — DX: Personal history of other malignant neoplasm of kidney: Z85.528

## 2015-07-13 HISTORY — DX: Hyperlipidemia, unspecified: E78.5

## 2015-07-13 HISTORY — DX: Personal history of other diseases of the digestive system: Z87.19

## 2015-07-13 HISTORY — DX: Hydrocele, unspecified: N43.3

## 2015-07-13 HISTORY — DX: Essential (primary) hypertension: I10

## 2015-07-13 HISTORY — DX: Anemia in chronic kidney disease: D63.1

## 2015-07-13 HISTORY — PX: HYDROCELE EXCISION: SHX482

## 2015-07-13 HISTORY — DX: Personal history of adenomatous and serrated colon polyps: Z86.0101

## 2015-07-13 HISTORY — DX: Dysuria: R30.0

## 2015-07-13 HISTORY — DX: Chronic kidney disease, stage 2 (mild): N18.2

## 2015-07-13 SURGERY — HYDROCELECTOMY
Anesthesia: Monitor Anesthesia Care | Site: Scrotum

## 2015-07-13 MED ORDER — CEFAZOLIN SODIUM-DEXTROSE 2-3 GM-% IV SOLR
INTRAVENOUS | Status: AC
  Filled 2015-07-13: qty 50

## 2015-07-13 MED ORDER — FENTANYL CITRATE (PF) 100 MCG/2ML IJ SOLN
INTRAMUSCULAR | Status: DC | PRN
  Administered 2015-07-13 (×4): 25 ug via INTRAVENOUS

## 2015-07-13 MED ORDER — CEPHALEXIN 500 MG PO CAPS: 500.0000 mg | ORAL_CAPSULE | Freq: Four times a day (QID) | ORAL | Status: DC

## 2015-07-13 MED ORDER — FENTANYL CITRATE (PF) 100 MCG/2ML IJ SOLN
25.0000 ug | INTRAMUSCULAR | Status: DC | PRN
  Filled 2015-07-13: qty 1

## 2015-07-13 MED ORDER — ONDANSETRON HCL 4 MG/2ML IJ SOLN
INTRAMUSCULAR | Status: DC | PRN
  Administered 2015-07-13: 4 mg via INTRAVENOUS

## 2015-07-13 MED ORDER — PHENYLEPHRINE HCL 10 MG/ML IJ SOLN
INTRAMUSCULAR | Status: DC | PRN
  Administered 2015-07-13: 80 ug via INTRAVENOUS
  Administered 2015-07-13: 120 ug via INTRAVENOUS
  Administered 2015-07-13: 80 ug via INTRAVENOUS
  Administered 2015-07-13: 120 ug via INTRAVENOUS

## 2015-07-13 MED ORDER — SODIUM CHLORIDE 0.9 % IR SOLN
Status: DC | PRN
  Administered 2015-07-13: 3000 mL

## 2015-07-13 MED ORDER — BUPIVACAINE HCL (PF) 0.25 % IJ SOLN
INTRAMUSCULAR | Status: DC | PRN
  Administered 2015-07-13: 15 mL

## 2015-07-13 MED ORDER — ACETAMINOPHEN 325 MG PO TABS
650.0000 mg | ORAL_TABLET | ORAL | Status: DC | PRN
  Filled 2015-07-13: qty 2

## 2015-07-13 MED ORDER — LACTATED RINGERS IV SOLN
INTRAVENOUS | Status: DC
  Filled 2015-07-13: qty 1000

## 2015-07-13 MED ORDER — ACETAMINOPHEN 650 MG RE SUPP
650.0000 mg | RECTAL | Status: DC | PRN
  Filled 2015-07-13: qty 1

## 2015-07-13 MED ORDER — OXYCODONE HCL 5 MG PO TABS
5.0000 mg | ORAL_TABLET | ORAL | Status: DC | PRN
  Filled 2015-07-13: qty 2

## 2015-07-13 MED ORDER — LIDOCAINE HCL (CARDIAC) 20 MG/ML IV SOLN
INTRAVENOUS | Status: DC | PRN
  Administered 2015-07-13: 70 mg via INTRAVENOUS

## 2015-07-13 MED ORDER — SODIUM CHLORIDE 0.9 % IV SOLN
250.0000 mL | INTRAVENOUS | Status: DC | PRN
  Filled 2015-07-13: qty 250

## 2015-07-13 MED ORDER — HYDROCODONE-ACETAMINOPHEN 5-325 MG PO TABS: 1.0000 | ORAL_TABLET | Freq: Four times a day (QID) | ORAL | Status: DC | PRN

## 2015-07-13 MED ORDER — 0.9 % SODIUM CHLORIDE (POUR BTL) OPTIME
TOPICAL | Status: DC | PRN
  Administered 2015-07-13: 1000 mL

## 2015-07-13 MED ORDER — ACETAMINOPHEN 10 MG/ML IV SOLN
INTRAVENOUS | Status: DC | PRN
Start: 2015-07-13 — End: 2015-07-13
  Administered 2015-07-13: 1000 mg via INTRAVENOUS

## 2015-07-13 MED ORDER — CEFAZOLIN SODIUM-DEXTROSE 2-3 GM-% IV SOLR
2.0000 g | INTRAVENOUS | Status: AC
  Administered 2015-07-13: 2 g via INTRAVENOUS
  Filled 2015-07-13: qty 50

## 2015-07-13 MED ORDER — PROPOFOL 10 MG/ML IV BOLUS
INTRAVENOUS | Status: DC | PRN
  Administered 2015-07-13: 100 mg via INTRAVENOUS

## 2015-07-13 MED ORDER — EPHEDRINE SULFATE 50 MG/ML IJ SOLN
INTRAMUSCULAR | Status: DC | PRN
  Administered 2015-07-13 (×2): 15 mg via INTRAVENOUS
  Administered 2015-07-13 (×2): 10 mg via INTRAVENOUS

## 2015-07-13 MED ORDER — FENTANYL CITRATE (PF) 100 MCG/2ML IJ SOLN
INTRAMUSCULAR | Status: AC
  Filled 2015-07-13: qty 4

## 2015-07-13 MED ORDER — SODIUM CHLORIDE 0.9 % IJ SOLN
3.0000 mL | INTRAMUSCULAR | Status: DC | PRN
  Filled 2015-07-13: qty 3

## 2015-07-13 MED ORDER — SODIUM CHLORIDE 0.9 % IJ SOLN
3.0000 mL | Freq: Two times a day (BID) | INTRAMUSCULAR | Status: DC
  Filled 2015-07-13: qty 3

## 2015-07-13 MED ORDER — PHENYLEPHRINE HCL 10 MG/ML IJ SOLN
10.0000 mg | INTRAVENOUS | Status: DC | PRN
  Administered 2015-07-13: 50 ug/min via INTRAVENOUS

## 2015-07-13 MED ORDER — SODIUM CHLORIDE 0.9 % IV SOLN
INTRAVENOUS | Status: DC
Start: 2015-07-13 — End: 2015-07-13
  Administered 2015-07-13 (×2): via INTRAVENOUS
  Filled 2015-07-13: qty 1000

## 2015-07-13 SURGICAL SUPPLY — 54 items
BAG DRAIN CYSTO-URO STER (DRAIN) ×2 IMPLANT
BAG DRAIN URO-CYSTO SKYTR STRL (DRAIN) ×4 IMPLANT
BAG DRN UROCATH (DRAIN) ×2
BLADE CLIPPER SURG (BLADE) ×4 IMPLANT
BLADE SURG 15 STRL LF DISP TIS (BLADE) ×2 IMPLANT
BLADE SURG 15 STRL SS (BLADE) ×4
BNDG GAUZE ELAST 4 BULKY (GAUZE/BANDAGES/DRESSINGS) ×4 IMPLANT
CANISTER SUCT LVC 12 LTR MEDI- (MISCELLANEOUS) ×4 IMPLANT
CANISTER SUCTION 2500CC (MISCELLANEOUS) ×4 IMPLANT
CATH FOLEY 2WAY SLVR  5CC 16FR (CATHETERS)
CATH FOLEY 2WAY SLVR 5CC 16FR (CATHETERS) IMPLANT
CATH URET 5FR 28IN OPEN ENDED (CATHETERS) IMPLANT
CLEANER CAUTERY TIP 5X5 PAD (MISCELLANEOUS) IMPLANT
CLOTH BEACON ORANGE TIMEOUT ST (SAFETY) ×4 IMPLANT
COVER BACK TABLE 60X90IN (DRAPES) ×2 IMPLANT
COVER MAYO STAND STRL (DRAPES) ×4 IMPLANT
DISSECTOR ROUND CHERRY 3/8 STR (MISCELLANEOUS) IMPLANT
DRAIN PENROSE 18X1/2 LTX STRL (DRAIN) ×2 IMPLANT
DRAPE PED LAPAROTOMY (DRAPES) ×4 IMPLANT
ELECT REM PT RETURN 9FT ADLT (ELECTROSURGICAL) ×4
ELECTRODE REM PT RTRN 9FT ADLT (ELECTROSURGICAL) ×2 IMPLANT
GAUZE SPONGE 4X4 16PLY XRAY LF (GAUZE/BANDAGES/DRESSINGS) ×4 IMPLANT
GLOVE SURG SS PI 8.0 STRL IVOR (GLOVE) ×6 IMPLANT
GOWN STRL REUS W/ TWL LRG LVL3 (GOWN DISPOSABLE) ×2 IMPLANT
GOWN STRL REUS W/ TWL XL LVL3 (GOWN DISPOSABLE) ×2 IMPLANT
GOWN STRL REUS W/TWL LRG LVL3 (GOWN DISPOSABLE) ×4
GOWN STRL REUS W/TWL XL LVL3 (GOWN DISPOSABLE) ×4
GUIDEWIRE STR DUAL SENSOR (WIRE) IMPLANT
KIT ROOM TURNOVER WOR (KITS) ×4 IMPLANT
LIQUID BAND (GAUZE/BANDAGES/DRESSINGS) ×2 IMPLANT
NDL HYPO 25X1 1.5 SAFETY (NEEDLE) ×2 IMPLANT
NDL SAFETY ECLIPSE 18X1.5 (NEEDLE) IMPLANT
NEEDLE HYPO 18GX1.5 SHARP (NEEDLE) ×4
NEEDLE HYPO 22GX1.5 SAFETY (NEEDLE) ×2 IMPLANT
NEEDLE HYPO 25X1 1.5 SAFETY (NEEDLE) ×4 IMPLANT
NS IRRIG 500ML POUR BTL (IV SOLUTION) ×6 IMPLANT
PACK BASIN DAY SURGERY FS (CUSTOM PROCEDURE TRAY) ×4 IMPLANT
PACK CYSTO (CUSTOM PROCEDURE TRAY) ×4 IMPLANT
PAD CLEANER CAUTERY TIP 5X5 (MISCELLANEOUS) ×2
PENCIL BUTTON HOLSTER BLD 10FT (ELECTRODE) ×4 IMPLANT
SUPPORT SCROTAL MED ADLT STRP (MISCELLANEOUS) ×1 IMPLANT
SUPPORTER ATHLETIC MED (MISCELLANEOUS) ×1
SUT CHROMIC 3 0 SH 27 (SUTURE) ×10 IMPLANT
SUT VICRYL 0 TIES 12 18 (SUTURE) ×2 IMPLANT
SYR 20CC LL (SYRINGE) IMPLANT
SYR BULB IRRIGATION 50ML (SYRINGE) ×4 IMPLANT
SYRINGE CONTROL L 12CC (SYRINGE) ×4 IMPLANT
SYRINGE CONTROL LL 12CC (SYRINGE) ×2 IMPLANT
TOWEL OR 17X24 6PK STRL BLUE (TOWEL DISPOSABLE) ×10 IMPLANT
TRAY DSU PREP LF (CUSTOM PROCEDURE TRAY) ×4 IMPLANT
TUBE CONNECTING 12'X1/4 (SUCTIONS) ×3
TUBE CONNECTING 12X1/4 (SUCTIONS) ×5 IMPLANT
WATER STERILE IRR 3000ML UROMA (IV SOLUTION) ×2 IMPLANT
YANKAUER SUCT BULB TIP NO VENT (SUCTIONS) ×4 IMPLANT

## 2015-07-13 NOTE — Interval H&P Note (Signed)
History and Physical Interval Note:  07/13/2015 9:57 AM  [name] [name]  has presented today for surgery, with the diagnosis of MASSIVE RIGHT HYDROCELE, POSSIBLE LEFT HYDROCELE, HEMATURIA  The various methods of treatment have been discussed with the patient and family. After consideration of risks, benefits and other options for treatment, the patient has consented to  Procedure(s) with comments: HYDROCELECTOMY ADULT (Bilateral) - **POSSIBLE LEFT HYDROCELECTOMY**     CYSTOSCOPY (N/A) as a surgical intervention .  The patient's history has been reviewed, patient examined, no change in status, stable for surgery.  I have reviewed the patient's chart and labs.  Questions were answered to the patient's satisfaction.     Dermot Gremillion J

## 2015-07-13 NOTE — Discharge Instructions (Signed)
Hydrocelectomy, Care After Refer to this sheet in the next few weeks. These instructions provide you with information about caring for yourself after your procedure. Your health care provider may also give you more specific instructions. Your treatment has been planned according to current medical practices, but problems sometimes occur. Call your health care provider if you have any problems or questions after your procedure. WHAT TO EXPECT AFTER THE PROCEDURE After your procedure, it is common for the pouch that holds your testicles (scrotum) to be painful, swollen, and bruised. HOME CARE INSTRUCTIONS Bathing  Ask your health care provider when you can shower, take baths, or go swimming.  If you were told to wear an athletic support strap, take it off when you shower or take a bath. Incision Care  Follow instructions from your health care provider about how to take care of your incision. Make sure you:  Wash your hands with soap and water before you change your bandage (dressing). If soap and water are not available, use hand sanitizer.  Change your dressing as told by your health care provider.  Leave stitches (sutures) in place.  Check your incision and scrotum every day for signs of infection. Check for:  More redness, swelling, or pain.  Blood or fluid.  Warmth.  Pus or a bad smell. Managing Pain, Stiffness, and Swelling  If directed, apply ice to the injured area:  Put ice in a plastic bag.  Place a towel between your skin and the bag.  Leave the ice on for 20 minutes, 2-3 times per day. Driving  Do not drive for 24 hours if you received a sedative.  Do not drive or operate heavy machinery while taking prescription pain medicine.  Ask your health care provider when it is safe to drive. Activity  Do not do any activities that require great strength and energy (are vigorous) for as long as told by your health care provider.  Return to your normal activities as  told by your health care provider. Ask your health care provider what activities are safe for you.  Do not lift anything that is heavier than 10 lb (4.5 kg) until your health care provider says that it is safe. General Instructions  Take over-the-counter and prescription medicines only as told by your health care provider.  Keep all follow-up visits as told by your health care provider. This is important.  If you were given an athletic support strap, wear it as told by your health care provider.  If you had a drain put in during the procedure, you will need to return to have it removed. SEEK MEDICAL CARE IF:  Your pain gets worse.  You have more redness, swelling, or pain around your scrotum.  You have blood or fluid coming from your scrotum.  Your incision feels warm to the touch.  You have pus or a bad smell coming from your scrotum.  You have a fever.   This information is not intended to replace advice given to you by your health care provider. Make sure you discuss any questions you have with your health care provider.   Document Released: 05/19/2015 Document Reviewed: 02/24/2015 Elsevier Interactive Patient Education 2016 [place] PROCEDURES  Wound Care & Hygiene: You may apply an ice bag to the scrotum for the first 24 hours.  This may help decrease swelling and soreness.  You may have a dressing held in place by an athletic supporter.  You may remove the dressing in 24 hours and shower in 48 hours.  Continue to use the athletic supporter or tight briefs for at least a week. Activity: Rest today - not necessarily flat bed rest.  Just take it easy.  You should not do strenuous activities until your follow-up visit with your doctor.  You may resume light activity in 48 hours.  Return to Work:  Your doctor will advise you of this depending on the type of work you do  Diet: Drink liquids or eat a light diet this evening.   You may resume a regular diet tomorrow.  General Expectations: You may have a small amount of bleeding.  The scrotum may be swollen or bruised for about a week.  Call your Doctor if these occur:  -persistent or heavy bleeding  -temperature of 101 degrees or more  -severe pain, not relieved by your pain medication  Return to [employer]:  Call to set up and appointment.     Post Anesthesia Home Care Instructions  Activity: Get plenty of rest for the remainder of the day. A responsible adult should stay with you for 24 hours following the procedure.  For the next 24 hours, DO NOT: -Drive a car -Paediatric nurse -Drink alcoholic beverages -Take any medication unless instructed by your physician -Make any legal decisions or sign important papers.  Meals: Start with liquid foods such as gelatin or soup. Progress to regular foods as tolerated. Avoid greasy, spicy, heavy foods. If nausea and/or vomiting occur, drink only clear liquids until the nausea and/or vomiting subsides. Call your physician if vomiting continues.  Special Instructions/Symptoms: Your throat may feel dry or sore from the anesthesia or the breathing tube placed in your throat during surgery. If this causes discomfort, gargle with warm salt water. The discomfort should disappear within 24 hours.  If you had a scopolamine patch placed behind your ear for the management of post- operative nausea and/or vomiting:  1. The medication in the patch is effective for 72 hours, after which it should be removed.  Wrap patch in a tissue and discard in the trash. Wash hands thoroughly with soap and water. 2. You may remove the patch earlier than 72 hours if you experience unpleasant side effects which may include dry mouth, dizziness or visual disturbances. 3. Avoid touching the patch. Wash your hands with soap and water after contact with the patch.

## 2015-07-13 NOTE — Anesthesia Procedure Notes (Signed)
Procedure Name: LMA Insertion Date/Time: 07/13/2015 10:14 AM Performed by: Wanita Chamberlain Pre-anesthesia Checklist: Patient identified, Timeout performed, Emergency Drugs available, Suction available and Patient being monitored Patient Re-evaluated:Patient Re-evaluated prior to inductionOxygen Delivery Method: Circle system utilized Preoxygenation: Pre-oxygenation with 100% oxygen Intubation Type: IV induction Ventilation: Mask ventilation without difficulty LMA: LMA inserted LMA Size: 5.0 Number of attempts: 1 Placement Confirmation: breath sounds checked- equal and bilateral and positive ETCO2 Tube secured with: Tape Dental Injury: Teeth and Oropharynx as per pre-operative assessment

## 2015-07-13 NOTE — Transfer of Care (Signed)
Immediate Anesthesia Transfer of Care Note  Patient: [name] [name]  Procedure(s) Performed: Procedure(s): RIGHT HYDROCELECTOMY, ADULT DRAINAGE OF LEFT HYDROCELE (Bilateral) CYSTOSCOPY (N/A)  Patient Location: PACU  Anesthesia Type:General  Level of Consciousness: sedated and patient cooperative  Airway & Oxygen Therapy: Patient Spontanous Breathing and Patient connected to nasal cannula oxygen  Post-op Assessment: Report given to RN and Post -op Vital signs reviewed and stable  Post vital signs: Reviewed and stable  Last Vitals:  Filed Vitals:   07/13/15 0850  BP: 148/92  Pulse: 39  Temp: 36.6 C  Resp: 16    Complications: No apparent anesthesia complications

## 2015-07-13 NOTE — Anesthesia Preprocedure Evaluation (Addendum)
Anesthesia Evaluation  Patient identified by MRN, date of birth, ID band Patient awake    Reviewed: Allergy & Precautions, NPO status   History of Anesthesia Complications Negative for: history of anesthetic complications  Airway Mallampati: I  TM Distance: >3 FB Neck ROM: Full    Dental  (+) Missing, Dental Advisory Given, Edentulous Upper   Pulmonary neg pulmonary ROS,    breath sounds clear to auscultation       Cardiovascular hypertension,  Rhythm:Regular Rate:Normal     Neuro/Psych TIA   GI/Hepatic negative GI ROS, Neg liver ROS,   Endo/Other    Renal/GU Renal diseaseRenal transplant, chronic  Renal disease     Musculoskeletal  (+) Arthritis ,   Abdominal   Peds  Hematology  (+) anemia ,   Anesthesia Other Findings   Reproductive/Obstetrics                         Anesthesia Physical Anesthesia Plan  ASA: III  Anesthesia Plan: General and MAC   Post-op Pain Management:    Induction: Intravenous  Airway Management Planned: LMA  Additional Equipment:   Intra-op Plan:   Post-operative Plan: Extubation in OR  Informed Consent: I have reviewed the patients History and Physical, chart, labs and discussed the procedure including the risks, benefits and alternatives for the proposed anesthesia with the patient or authorized representative who has indicated his/her understanding and acceptance.   Dental advisory given  Plan Discussed with:   Anesthesia Plan Comments:        Anesthesia Quick Evaluation

## 2015-07-13 NOTE — Brief Op Note (Signed)
07/13/2015  11:52 AM  PATIENT:  [name] [name]  81 y.o. male  PRE-OPERATIVE DIAGNOSIS:  MASSIVE RIGHT HYDROCELE, POSSIBLE LEFT HYDROCELE, HEMATURIA  POST-OPERATIVE DIAGNOSIS:  BILATERAL HYDROCELE, HEMATURIA, BLADDER STONE  PROCEDURE:  Procedure(s): RIGHT HYDROCELECTOMY with RIGHT ORCHIECTOMY, ADULT DRAINAGE OF LEFT HYDROCELE (Bilateral) CYSTOSCOPY WITH REMOVAL OF BLADDER STONE(N/A)   SURGEON:  Surgeon(s) and Role:    * Irine Seal, MD - Primary  PHYSICIAN ASSISTANT:   ASSISTANTS: none   ANESTHESIA:   local and general  EBL:  Total I/O In: 400 [I.V.:400] Out: 1635 [Other:1585; Blood:50]  BLOOD ADMINISTERED:none  DRAINS: Penrose drain in the right scrotum   LOCAL MEDICATIONS USED:  MARCAINE    and Amount: 15 ml  SPECIMEN:  Source of Specimen:  right hydrocele sac and testicle  DISPOSITION OF SPECIMEN:  PATHOLOGY  COUNTS:  YES  TOURNIQUET:  * No tourniquets in log *  DICTATION: .Other Dictation: Dictation Number [id number]  PLAN OF CARE: Discharge to home after PACU  PATIENT DISPOSITION:  PACU - hemodynamically stable.   Delay start of Pharmacological VTE agent (>24hrs) due to surgical blood loss or risk of bleeding: not applicable

## 2015-07-13 NOTE — Anesthesia Postprocedure Evaluation (Signed)
  Anesthesia Post-op Note  Patient: [name] [name]  Procedure(s) Performed: Procedure(s): RIGHT HYDROCELECTOMY, ADULT DRAINAGE OF LEFT HYDROCELE (Bilateral) CYSTOSCOPY (N/A)  Patient Location: PACU  Anesthesia Type:General  Level of Consciousness: awake, sedated and responds to stimulation  Airway and Oxygen Therapy: Patient Spontanous Breathing  Post-op Pain: mild  Post-op Assessment: Post-op Vital signs reviewed and Patient's Cardiovascular Status Stable              Post-op Vital Signs: stable  Last Vitals:  Filed Vitals:   07/13/15 1245  BP: 128/92  Pulse: 74  Temp:   Resp: 11    Complications: No apparent anesthesia complications

## 2015-07-14 ENCOUNTER — Encounter (HOSPITAL_BASED_OUTPATIENT_CLINIC_OR_DEPARTMENT_OTHER): Payer: Self-pay | Admitting: Urology

## 2015-07-14 NOTE — Op Note (Signed)
NAMEJARL, SAGAL NO.:  [id number]  MEDICAL RECORD NO.:  [id number]  LOCATION:                                FACILITY:  [facility]  PHYSICIAN:  Marshall Cork. Jeffie Pollock, M.D.    DATE OF BIRTH:  11/14/1927  DATE OF PROCEDURE:  07/13/2015 DATE OF DISCHARGE:  07/13/2015                              OPERATIVE REPORT   PROCEDURE: 1. Complicated right hydrocelectomy with right orchectomy. 2. Aspiration of left hydrocele. 3. Cystoscopy with removal of bladder stone.  PREOPERATIVE DIAGNOSIS:  Microhematuria with massive right hydrocele and possible left hydrocele.  POSTOPERATIVE DIAGNOSES: 1. Small bladder stone on the anastomotic stitch. 2. Massive right hydrocele with markedly inflamed right testicle. 3. Small left hydrocele.  SURGEON:  Marshall Cork. Jeffie Pollock, M.D.  ANESTHESIA:  General with local using 15 mL of 0.25% Marcaine.  BLOOD LOSS:  50 mL.  DRAINS:  Half-inch Penrose drain.  COMPLICATIONS:  None.  INDICATIONS:  Mr. Whorton is an 81 year old African American male, who was referred by Dr. Jeneen Rinks Deterding for a massive right hydrocele on ultrasound.  It was a complex hydrocele with septations.  The testicle was difficult to see and there was a question of a small left hydrocele. He also had microhematuria and it was felt that cystoscopy was indicated.  FINDINGS AND PROCEDURE:  He was given 2 g of Ancef.  He was taken to the operating room where general anesthetic was induced.  He had received his Rapamune and prednisone for his history of renal transplant prior to the procedure.  He was initially placed in the lithotomy position and fitted with PAS hose.  His perineum and genitalia were prepped with Betadine solution.  He was draped in usual sterile fashion.  Cystoscopy was performed using a 23-French scope and the 30 and 70 degree lenses.  Examination revealed a normal urethra.  The external sphincter was intact.  The prostatic urethra was approximately 4 cm  in length with patent TUR defect without obstruction.  Examination of the bladder revealed the native ureteral orifices in the normal anatomic position.  There was moderate trabeculation with some cellules and small diverticula on the dome of the bladder.  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  I did not see the other neomeatus. No papillary tumors were identified.  Once thorough inspection was performed, the 70-degree lens with an Varney Baas was used to place a flexible biopsy forceps in position to remove the stone, this was grasped at its base and pulled away and on further inspection of the stone, there was a small stitch coming from the stone confirming that this was a stone that had formed on exposed anastomotic suture.  The site of removal had slight a bit of bleeding that was managed using a Bugbee electrode.  Once the cystoscopic procedure had been completed, the bladder was drained and the scope was removed.  The patient was then taken down from lithotomy position and moved into the supine position.  His anterior scrotum was clipped.  He was prepped again with Betadine solution and draped in usual sterile fashion.  A midline anterior scrotal incision was made with a knife.  The underlying hydrocele sac was densely adherent to the overlying dartos so I could not deliver the sac intact.  The sac was opened and drained. 1500 mL of dark brownish hydrocele fluid was aspirated.  Once the sac was drained, I was able to gradually peel it away from the wall.  It was most adherent in the dependent portion of the scrotum and I had to cut it away from the dartos.  Great care was taken not to excise additional skin.  Once the sac had been encircled, I opened the sac further and inspected the testicle, it was markedly  inflamed with significant surrounding induration and inflammation, and it was felt to allow most complete healing, the patient will be best served by orchiectomy.  We had discussed this preoperatively.  The sac was then dissected out to the spermatic cord and the cord was divided into 2 packets and divided.  Each packet was doubly ligated with 2-0 Vicryl ties.  Once this had been completed, the specimen was removed to the back table and the scrotum was inspected for bleeding.  Some small bleeders were fulgurated.  The scrotum was irrigated and then a half-inch Penrose drain was placed through a separate stab wound in the dependent position of the scrotum.  During this portion of the procedure, I palpated the left hemiscrotum. There did appear to be some fluid around the testicle, but it was not significant and the testicle was fairly soft.  I did end up aspirating the fluid from the left hydrocele using an 18-gauge needle and 10 mL syringe, there was only 35 mL of light yellow fluid, and I did not feel it was worth the additional trauma in an effort to perform a formal hydrocelectomy.  At this point, the right scrotum was inspected once again and there was no active bleeding.  The drain was positioned into the cavity remaining from the hydrocelectomy.  The dartos was then closed using a running 3-0 chromic suture with great care being taken to avoid entrapping the drain.  The skin was closed with a running vertical mattress 3-0 chromic and then reinforced with the liquid plastic dressing.  The drain was secured to the scrotum with a 3-0 chromic and trimmed to an appropriate length.  The scrotal area was cleansed.  Dressing of 4 x 4s, fluff, Kerlix, and a scrotal support was placed.  During the procedure, he received a local block with 5 mL of 0.25% Marcaine in the spermatic cord and 10 mL along the incision.  After the dressing was placed, his anesthetic was reversed.  He was moved  to recovery room in stable condition.  There were no complications.     Marshall Cork. Jeffie Pollock, M.D.     JJW/MEDQ  D:  07/13/2015  T:  07/14/2015  Job:  [id number]

## 2015-10-05 DIAGNOSIS — Z940 Kidney transplant status: Secondary | ICD-10-CM | POA: Diagnosis not present

## 2015-10-05 DIAGNOSIS — R319 Hematuria, unspecified: Secondary | ICD-10-CM | POA: Diagnosis not present

## 2015-10-05 DIAGNOSIS — I129 Hypertensive chronic kidney disease with stage 1 through stage 4 chronic kidney disease, or unspecified chronic kidney disease: Secondary | ICD-10-CM | POA: Diagnosis not present

## 2015-10-05 DIAGNOSIS — E790 Hyperuricemia without signs of inflammatory arthritis and tophaceous disease: Secondary | ICD-10-CM | POA: Diagnosis not present

## 2015-10-05 DIAGNOSIS — E46 Unspecified protein-calorie malnutrition: Secondary | ICD-10-CM | POA: Diagnosis not present

## 2015-10-05 DIAGNOSIS — N433 Hydrocele, unspecified: Secondary | ICD-10-CM | POA: Diagnosis not present

## 2015-10-05 DIAGNOSIS — R809 Proteinuria, unspecified: Secondary | ICD-10-CM | POA: Diagnosis not present

## 2015-10-05 DIAGNOSIS — N182 Chronic kidney disease, stage 2 (mild): Secondary | ICD-10-CM | POA: Diagnosis not present

## 2015-10-05 DIAGNOSIS — Z8673 Personal history of transient ischemic attack (TIA), and cerebral infarction without residual deficits: Secondary | ICD-10-CM | POA: Diagnosis not present

## 2015-10-05 DIAGNOSIS — F329 Major depressive disorder, single episode, unspecified: Secondary | ICD-10-CM | POA: Diagnosis not present

## 2015-10-05 DIAGNOSIS — E782 Mixed hyperlipidemia: Secondary | ICD-10-CM | POA: Diagnosis not present

## 2015-10-05 DIAGNOSIS — Z85528 Personal history of other malignant neoplasm of kidney: Secondary | ICD-10-CM | POA: Diagnosis not present

## 2015-12-15 DIAGNOSIS — H3563 Retinal hemorrhage, bilateral: Secondary | ICD-10-CM | POA: Diagnosis not present

## 2015-12-16 DIAGNOSIS — H25813 Combined forms of age-related cataract, bilateral: Secondary | ICD-10-CM | POA: Diagnosis not present

## 2015-12-16 DIAGNOSIS — H11001 Unspecified pterygium of right eye: Secondary | ICD-10-CM | POA: Diagnosis not present

## 2015-12-16 DIAGNOSIS — H4312 Vitreous hemorrhage, left eye: Secondary | ICD-10-CM | POA: Diagnosis not present

## 2015-12-16 DIAGNOSIS — H34812 Central retinal vein occlusion, left eye, with macular edema: Secondary | ICD-10-CM | POA: Diagnosis not present

## 2016-01-03 DIAGNOSIS — I129 Hypertensive chronic kidney disease with stage 1 through stage 4 chronic kidney disease, or unspecified chronic kidney disease: Secondary | ICD-10-CM | POA: Diagnosis not present

## 2016-01-03 DIAGNOSIS — E790 Hyperuricemia without signs of inflammatory arthritis and tophaceous disease: Secondary | ICD-10-CM | POA: Diagnosis not present

## 2016-01-03 DIAGNOSIS — Z940 Kidney transplant status: Secondary | ICD-10-CM | POA: Diagnosis not present

## 2016-01-03 DIAGNOSIS — Z8673 Personal history of transient ischemic attack (TIA), and cerebral infarction without residual deficits: Secondary | ICD-10-CM | POA: Diagnosis not present

## 2016-01-03 DIAGNOSIS — R319 Hematuria, unspecified: Secondary | ICD-10-CM | POA: Diagnosis not present

## 2016-01-03 DIAGNOSIS — E46 Unspecified protein-calorie malnutrition: Secondary | ICD-10-CM | POA: Diagnosis not present

## 2016-01-03 DIAGNOSIS — N433 Hydrocele, unspecified: Secondary | ICD-10-CM | POA: Diagnosis not present

## 2016-01-03 DIAGNOSIS — Z85528 Personal history of other malignant neoplasm of kidney: Secondary | ICD-10-CM | POA: Diagnosis not present

## 2016-01-03 DIAGNOSIS — F329 Major depressive disorder, single episode, unspecified: Secondary | ICD-10-CM | POA: Diagnosis not present

## 2016-01-03 DIAGNOSIS — R809 Proteinuria, unspecified: Secondary | ICD-10-CM | POA: Diagnosis not present

## 2016-01-03 DIAGNOSIS — E782 Mixed hyperlipidemia: Secondary | ICD-10-CM | POA: Diagnosis not present

## 2016-01-03 DIAGNOSIS — N182 Chronic kidney disease, stage 2 (mild): Secondary | ICD-10-CM | POA: Diagnosis not present

## 2016-01-13 DIAGNOSIS — Z940 Kidney transplant status: Secondary | ICD-10-CM | POA: Diagnosis not present

## 2016-01-21 DIAGNOSIS — H34812 Central retinal vein occlusion, left eye, with macular edema: Secondary | ICD-10-CM | POA: Diagnosis not present

## 2016-02-22 DIAGNOSIS — H34812 Central retinal vein occlusion, left eye, with macular edema: Secondary | ICD-10-CM | POA: Diagnosis not present

## 2016-03-17 DIAGNOSIS — N182 Chronic kidney disease, stage 2 (mild): Secondary | ICD-10-CM | POA: Diagnosis not present

## 2016-03-24 DIAGNOSIS — H34812 Central retinal vein occlusion, left eye, with macular edema: Secondary | ICD-10-CM | POA: Diagnosis not present

## 2016-03-28 DIAGNOSIS — N182 Chronic kidney disease, stage 2 (mild): Secondary | ICD-10-CM | POA: Diagnosis not present

## 2016-03-28 DIAGNOSIS — I129 Hypertensive chronic kidney disease with stage 1 through stage 4 chronic kidney disease, or unspecified chronic kidney disease: Secondary | ICD-10-CM | POA: Diagnosis not present

## 2016-03-31 DIAGNOSIS — R809 Proteinuria, unspecified: Secondary | ICD-10-CM | POA: Diagnosis not present

## 2016-03-31 DIAGNOSIS — Z85528 Personal history of other malignant neoplasm of kidney: Secondary | ICD-10-CM | POA: Diagnosis not present

## 2016-03-31 DIAGNOSIS — R319 Hematuria, unspecified: Secondary | ICD-10-CM | POA: Diagnosis not present

## 2016-03-31 DIAGNOSIS — F329 Major depressive disorder, single episode, unspecified: Secondary | ICD-10-CM | POA: Diagnosis not present

## 2016-03-31 DIAGNOSIS — Z86010 Personal history of colonic polyps: Secondary | ICD-10-CM | POA: Diagnosis not present

## 2016-03-31 DIAGNOSIS — Z940 Kidney transplant status: Secondary | ICD-10-CM | POA: Diagnosis not present

## 2016-03-31 DIAGNOSIS — N182 Chronic kidney disease, stage 2 (mild): Secondary | ICD-10-CM | POA: Diagnosis not present

## 2016-03-31 DIAGNOSIS — E782 Mixed hyperlipidemia: Secondary | ICD-10-CM | POA: Diagnosis not present

## 2016-03-31 DIAGNOSIS — Z8673 Personal history of transient ischemic attack (TIA), and cerebral infarction without residual deficits: Secondary | ICD-10-CM | POA: Diagnosis not present

## 2016-03-31 DIAGNOSIS — I129 Hypertensive chronic kidney disease with stage 1 through stage 4 chronic kidney disease, or unspecified chronic kidney disease: Secondary | ICD-10-CM | POA: Diagnosis not present

## 2016-03-31 DIAGNOSIS — E790 Hyperuricemia without signs of inflammatory arthritis and tophaceous disease: Secondary | ICD-10-CM | POA: Diagnosis not present

## 2016-03-31 DIAGNOSIS — E46 Unspecified protein-calorie malnutrition: Secondary | ICD-10-CM | POA: Diagnosis not present

## 2016-04-26 DIAGNOSIS — H4312 Vitreous hemorrhage, left eye: Secondary | ICD-10-CM | POA: Diagnosis not present

## 2016-04-26 DIAGNOSIS — H35372 Puckering of macula, left eye: Secondary | ICD-10-CM | POA: Diagnosis not present

## 2016-04-26 DIAGNOSIS — H34812 Central retinal vein occlusion, left eye, with macular edema: Secondary | ICD-10-CM | POA: Diagnosis not present

## 2016-04-26 DIAGNOSIS — H25813 Combined forms of age-related cataract, bilateral: Secondary | ICD-10-CM | POA: Diagnosis not present

## 2016-04-26 DIAGNOSIS — H11001 Unspecified pterygium of right eye: Secondary | ICD-10-CM | POA: Diagnosis not present

## 2016-05-22 DIAGNOSIS — H11011 Amyloid pterygium of right eye: Secondary | ICD-10-CM | POA: Diagnosis not present

## 2016-05-22 DIAGNOSIS — H25812 Combined forms of age-related cataract, left eye: Secondary | ICD-10-CM | POA: Diagnosis not present

## 2016-05-22 DIAGNOSIS — H35372 Puckering of macula, left eye: Secondary | ICD-10-CM | POA: Diagnosis not present

## 2016-05-26 DIAGNOSIS — Z940 Kidney transplant status: Secondary | ICD-10-CM | POA: Diagnosis not present

## 2016-05-26 DIAGNOSIS — N390 Urinary tract infection, site not specified: Secondary | ICD-10-CM | POA: Diagnosis not present

## 2016-05-26 DIAGNOSIS — N182 Chronic kidney disease, stage 2 (mild): Secondary | ICD-10-CM | POA: Diagnosis not present

## 2016-05-30 DIAGNOSIS — E790 Hyperuricemia without signs of inflammatory arthritis and tophaceous disease: Secondary | ICD-10-CM | POA: Diagnosis not present

## 2016-05-30 DIAGNOSIS — E46 Unspecified protein-calorie malnutrition: Secondary | ICD-10-CM | POA: Diagnosis not present

## 2016-05-30 DIAGNOSIS — R809 Proteinuria, unspecified: Secondary | ICD-10-CM | POA: Diagnosis not present

## 2016-05-30 DIAGNOSIS — Z940 Kidney transplant status: Secondary | ICD-10-CM | POA: Diagnosis not present

## 2016-05-30 DIAGNOSIS — R319 Hematuria, unspecified: Secondary | ICD-10-CM | POA: Diagnosis not present

## 2016-05-30 DIAGNOSIS — Z85528 Personal history of other malignant neoplasm of kidney: Secondary | ICD-10-CM | POA: Diagnosis not present

## 2016-05-30 DIAGNOSIS — Z8673 Personal history of transient ischemic attack (TIA), and cerebral infarction without residual deficits: Secondary | ICD-10-CM | POA: Diagnosis not present

## 2016-05-30 DIAGNOSIS — N182 Chronic kidney disease, stage 2 (mild): Secondary | ICD-10-CM | POA: Diagnosis not present

## 2016-05-30 DIAGNOSIS — E782 Mixed hyperlipidemia: Secondary | ICD-10-CM | POA: Diagnosis not present

## 2016-05-30 DIAGNOSIS — I129 Hypertensive chronic kidney disease with stage 1 through stage 4 chronic kidney disease, or unspecified chronic kidney disease: Secondary | ICD-10-CM | POA: Diagnosis not present

## 2016-05-30 DIAGNOSIS — Z86010 Personal history of colonic polyps: Secondary | ICD-10-CM | POA: Diagnosis not present

## 2016-06-07 DIAGNOSIS — Z23 Encounter for immunization: Secondary | ICD-10-CM | POA: Diagnosis not present

## 2016-06-14 DIAGNOSIS — H25812 Combined forms of age-related cataract, left eye: Secondary | ICD-10-CM | POA: Diagnosis not present

## 2016-06-14 DIAGNOSIS — H2512 Age-related nuclear cataract, left eye: Secondary | ICD-10-CM | POA: Diagnosis not present

## 2016-06-22 DIAGNOSIS — H35372 Puckering of macula, left eye: Secondary | ICD-10-CM | POA: Diagnosis not present

## 2016-06-22 DIAGNOSIS — H3581 Retinal edema: Secondary | ICD-10-CM | POA: Diagnosis not present

## 2016-07-13 DIAGNOSIS — H3581 Retinal edema: Secondary | ICD-10-CM | POA: Diagnosis not present

## 2016-09-05 DIAGNOSIS — E782 Mixed hyperlipidemia: Secondary | ICD-10-CM | POA: Diagnosis not present

## 2016-09-05 DIAGNOSIS — N182 Chronic kidney disease, stage 2 (mild): Secondary | ICD-10-CM | POA: Diagnosis not present

## 2016-09-05 DIAGNOSIS — Z85528 Personal history of other malignant neoplasm of kidney: Secondary | ICD-10-CM | POA: Diagnosis not present

## 2016-09-05 DIAGNOSIS — I129 Hypertensive chronic kidney disease with stage 1 through stage 4 chronic kidney disease, or unspecified chronic kidney disease: Secondary | ICD-10-CM | POA: Diagnosis not present

## 2016-09-05 DIAGNOSIS — R809 Proteinuria, unspecified: Secondary | ICD-10-CM | POA: Diagnosis not present

## 2016-09-05 DIAGNOSIS — R319 Hematuria, unspecified: Secondary | ICD-10-CM | POA: Diagnosis not present

## 2016-09-05 DIAGNOSIS — Z8673 Personal history of transient ischemic attack (TIA), and cerebral infarction without residual deficits: Secondary | ICD-10-CM | POA: Diagnosis not present

## 2016-09-05 DIAGNOSIS — E46 Unspecified protein-calorie malnutrition: Secondary | ICD-10-CM | POA: Diagnosis not present

## 2016-09-05 DIAGNOSIS — E790 Hyperuricemia without signs of inflammatory arthritis and tophaceous disease: Secondary | ICD-10-CM | POA: Diagnosis not present

## 2016-09-05 DIAGNOSIS — Z940 Kidney transplant status: Secondary | ICD-10-CM | POA: Diagnosis not present

## 2016-09-05 DIAGNOSIS — Z86010 Personal history of colonic polyps: Secondary | ICD-10-CM | POA: Diagnosis not present

## 2016-12-04 DIAGNOSIS — R809 Proteinuria, unspecified: Secondary | ICD-10-CM | POA: Diagnosis not present

## 2016-12-04 DIAGNOSIS — E782 Mixed hyperlipidemia: Secondary | ICD-10-CM | POA: Diagnosis not present

## 2016-12-04 DIAGNOSIS — E790 Hyperuricemia without signs of inflammatory arthritis and tophaceous disease: Secondary | ICD-10-CM | POA: Diagnosis not present

## 2016-12-04 DIAGNOSIS — N182 Chronic kidney disease, stage 2 (mild): Secondary | ICD-10-CM | POA: Diagnosis not present

## 2016-12-04 DIAGNOSIS — Z940 Kidney transplant status: Secondary | ICD-10-CM | POA: Diagnosis not present

## 2016-12-04 DIAGNOSIS — I129 Hypertensive chronic kidney disease with stage 1 through stage 4 chronic kidney disease, or unspecified chronic kidney disease: Secondary | ICD-10-CM | POA: Diagnosis not present

## 2016-12-04 DIAGNOSIS — E46 Unspecified protein-calorie malnutrition: Secondary | ICD-10-CM | POA: Diagnosis not present

## 2016-12-04 DIAGNOSIS — Z85528 Personal history of other malignant neoplasm of kidney: Secondary | ICD-10-CM | POA: Diagnosis not present

## 2016-12-04 DIAGNOSIS — R319 Hematuria, unspecified: Secondary | ICD-10-CM | POA: Diagnosis not present

## 2016-12-04 DIAGNOSIS — Z8673 Personal history of transient ischemic attack (TIA), and cerebral infarction without residual deficits: Secondary | ICD-10-CM | POA: Diagnosis not present

## 2016-12-04 DIAGNOSIS — Z86010 Personal history of colonic polyps: Secondary | ICD-10-CM | POA: Diagnosis not present

## 2017-01-05 DIAGNOSIS — Z940 Kidney transplant status: Secondary | ICD-10-CM | POA: Diagnosis not present

## 2017-03-06 DIAGNOSIS — Z8673 Personal history of transient ischemic attack (TIA), and cerebral infarction without residual deficits: Secondary | ICD-10-CM | POA: Diagnosis not present

## 2017-03-06 DIAGNOSIS — Z86010 Personal history of colonic polyps: Secondary | ICD-10-CM | POA: Diagnosis not present

## 2017-03-06 DIAGNOSIS — Z85528 Personal history of other malignant neoplasm of kidney: Secondary | ICD-10-CM | POA: Diagnosis not present

## 2017-03-06 DIAGNOSIS — E790 Hyperuricemia without signs of inflammatory arthritis and tophaceous disease: Secondary | ICD-10-CM | POA: Diagnosis not present

## 2017-03-06 DIAGNOSIS — Z940 Kidney transplant status: Secondary | ICD-10-CM | POA: Diagnosis not present

## 2017-03-06 DIAGNOSIS — N182 Chronic kidney disease, stage 2 (mild): Secondary | ICD-10-CM | POA: Diagnosis not present

## 2017-03-06 DIAGNOSIS — R809 Proteinuria, unspecified: Secondary | ICD-10-CM | POA: Diagnosis not present

## 2017-03-06 DIAGNOSIS — I129 Hypertensive chronic kidney disease with stage 1 through stage 4 chronic kidney disease, or unspecified chronic kidney disease: Secondary | ICD-10-CM | POA: Diagnosis not present

## 2017-03-06 DIAGNOSIS — E782 Mixed hyperlipidemia: Secondary | ICD-10-CM | POA: Diagnosis not present

## 2017-03-06 DIAGNOSIS — E46 Unspecified protein-calorie malnutrition: Secondary | ICD-10-CM | POA: Diagnosis not present

## 2017-03-06 DIAGNOSIS — R319 Hematuria, unspecified: Secondary | ICD-10-CM | POA: Diagnosis not present

## 2017-05-17 ENCOUNTER — Ambulatory Visit (INDEPENDENT_AMBULATORY_CARE_PROVIDER_SITE_OTHER): Payer: Medicare Other | Admitting: Physician Assistant

## 2017-06-08 DIAGNOSIS — E790 Hyperuricemia without signs of inflammatory arthritis and tophaceous disease: Secondary | ICD-10-CM | POA: Diagnosis not present

## 2017-06-08 DIAGNOSIS — R809 Proteinuria, unspecified: Secondary | ICD-10-CM | POA: Diagnosis not present

## 2017-06-08 DIAGNOSIS — Z85528 Personal history of other malignant neoplasm of kidney: Secondary | ICD-10-CM | POA: Diagnosis not present

## 2017-06-08 DIAGNOSIS — I129 Hypertensive chronic kidney disease with stage 1 through stage 4 chronic kidney disease, or unspecified chronic kidney disease: Secondary | ICD-10-CM | POA: Diagnosis not present

## 2017-06-08 DIAGNOSIS — E46 Unspecified protein-calorie malnutrition: Secondary | ICD-10-CM | POA: Diagnosis not present

## 2017-06-08 DIAGNOSIS — Z940 Kidney transplant status: Secondary | ICD-10-CM | POA: Diagnosis not present

## 2017-06-08 DIAGNOSIS — E782 Mixed hyperlipidemia: Secondary | ICD-10-CM | POA: Diagnosis not present

## 2017-06-08 DIAGNOSIS — Z23 Encounter for immunization: Secondary | ICD-10-CM | POA: Diagnosis not present

## 2017-06-08 DIAGNOSIS — Z86010 Personal history of colonic polyps: Secondary | ICD-10-CM | POA: Diagnosis not present

## 2017-06-08 DIAGNOSIS — N182 Chronic kidney disease, stage 2 (mild): Secondary | ICD-10-CM | POA: Diagnosis not present

## 2017-06-08 DIAGNOSIS — R319 Hematuria, unspecified: Secondary | ICD-10-CM | POA: Diagnosis not present

## 2017-06-08 DIAGNOSIS — Z8673 Personal history of transient ischemic attack (TIA), and cerebral infarction without residual deficits: Secondary | ICD-10-CM | POA: Diagnosis not present

## 2018-03-26 ENCOUNTER — Other Ambulatory Visit: Payer: Self-pay

## 2018-03-26 ENCOUNTER — Encounter (HOSPITAL_COMMUNITY): Payer: Self-pay

## 2018-03-26 ENCOUNTER — Inpatient Hospital Stay (HOSPITAL_COMMUNITY)
Admission: EM | Admit: 2018-03-26 | Discharge: 2018-03-29 | DRG: 308 | Disposition: A | Discharge status: Home / Self Care | Payer: Medicare Other | Attending: Internal Medicine | Admitting: Internal Medicine

## 2018-03-26 ENCOUNTER — Ambulatory Visit (INDEPENDENT_AMBULATORY_CARE_PROVIDER_SITE_OTHER)
Admission: EM | Admit: 2018-03-26 | Discharge: 2018-03-26 | Disposition: A | Discharge status: Home / Self Care | Payer: Medicare Other | Source: Home / Self Care | Attending: Family Medicine | Admitting: Family Medicine

## 2018-03-26 ENCOUNTER — Emergency Department (HOSPITAL_COMMUNITY): Payer: Medicare Other

## 2018-03-26 ENCOUNTER — Inpatient Hospital Stay (HOSPITAL_COMMUNITY): Payer: Medicare Other

## 2018-03-26 DIAGNOSIS — Z7982 Long term (current) use of aspirin: Secondary | ICD-10-CM

## 2018-03-26 DIAGNOSIS — N183 Chronic kidney disease, stage 3 (moderate): Secondary | ICD-10-CM | POA: Diagnosis present

## 2018-03-26 DIAGNOSIS — N179 Acute kidney failure, unspecified: Secondary | ICD-10-CM | POA: Diagnosis present

## 2018-03-26 DIAGNOSIS — I714 Abdominal aortic aneurysm, without rupture, unspecified: Secondary | ICD-10-CM | POA: Diagnosis present

## 2018-03-26 DIAGNOSIS — R000 Tachycardia, unspecified: Secondary | ICD-10-CM

## 2018-03-26 DIAGNOSIS — A419 Sepsis, unspecified organism: Secondary | ICD-10-CM

## 2018-03-26 DIAGNOSIS — R700 Elevated erythrocyte sedimentation rate: Secondary | ICD-10-CM | POA: Diagnosis present

## 2018-03-26 DIAGNOSIS — N400 Enlarged prostate without lower urinary tract symptoms: Secondary | ICD-10-CM | POA: Diagnosis present

## 2018-03-26 DIAGNOSIS — M899 Disorder of bone, unspecified: Secondary | ICD-10-CM | POA: Diagnosis not present

## 2018-03-26 DIAGNOSIS — I361 Nonrheumatic tricuspid (valve) insufficiency: Secondary | ICD-10-CM | POA: Diagnosis not present

## 2018-03-26 DIAGNOSIS — I4891 Unspecified atrial fibrillation: Secondary | ICD-10-CM | POA: Diagnosis not present

## 2018-03-26 DIAGNOSIS — Z881 Allergy status to other antibiotic agents status: Secondary | ICD-10-CM | POA: Diagnosis not present

## 2018-03-26 DIAGNOSIS — R0602 Shortness of breath: Secondary | ICD-10-CM | POA: Diagnosis not present

## 2018-03-26 DIAGNOSIS — Z85528 Personal history of other malignant neoplasm of kidney: Secondary | ICD-10-CM | POA: Diagnosis not present

## 2018-03-26 DIAGNOSIS — Z8673 Personal history of transient ischemic attack (TIA), and cerebral infarction without residual deficits: Secondary | ICD-10-CM | POA: Diagnosis not present

## 2018-03-26 DIAGNOSIS — R7982 Elevated C-reactive protein (CRP): Secondary | ICD-10-CM | POA: Diagnosis present

## 2018-03-26 DIAGNOSIS — Z681 Body mass index (BMI) 19 or less, adult: Secondary | ICD-10-CM | POA: Diagnosis not present

## 2018-03-26 DIAGNOSIS — R634 Abnormal weight loss: Secondary | ICD-10-CM | POA: Diagnosis present

## 2018-03-26 DIAGNOSIS — E43 Unspecified severe protein-calorie malnutrition: Secondary | ICD-10-CM | POA: Diagnosis present

## 2018-03-26 DIAGNOSIS — I129 Hypertensive chronic kidney disease with stage 1 through stage 4 chronic kidney disease, or unspecified chronic kidney disease: Secondary | ICD-10-CM | POA: Diagnosis present

## 2018-03-26 DIAGNOSIS — Z77090 Contact with and (suspected) exposure to asbestos: Secondary | ICD-10-CM | POA: Diagnosis present

## 2018-03-26 DIAGNOSIS — R6510 Systemic inflammatory response syndrome (SIRS) of non-infectious origin without acute organ dysfunction: Secondary | ICD-10-CM | POA: Diagnosis present

## 2018-03-26 DIAGNOSIS — I351 Nonrheumatic aortic (valve) insufficiency: Secondary | ICD-10-CM | POA: Diagnosis not present

## 2018-03-26 DIAGNOSIS — I959 Hypotension, unspecified: Secondary | ICD-10-CM

## 2018-03-26 DIAGNOSIS — K449 Diaphragmatic hernia without obstruction or gangrene: Secondary | ICD-10-CM | POA: Diagnosis not present

## 2018-03-26 DIAGNOSIS — Z7952 Long term (current) use of systemic steroids: Secondary | ICD-10-CM

## 2018-03-26 DIAGNOSIS — M898X9 Other specified disorders of bone, unspecified site: Secondary | ICD-10-CM | POA: Diagnosis present

## 2018-03-26 DIAGNOSIS — K224 Dyskinesia of esophagus: Secondary | ICD-10-CM | POA: Diagnosis present

## 2018-03-26 DIAGNOSIS — Z940 Kidney transplant status: Secondary | ICD-10-CM

## 2018-03-26 DIAGNOSIS — I481 Persistent atrial fibrillation: Secondary | ICD-10-CM | POA: Diagnosis not present

## 2018-03-26 DIAGNOSIS — R680 Hypothermia, not associated with low environmental temperature: Secondary | ICD-10-CM | POA: Diagnosis present

## 2018-03-26 DIAGNOSIS — R350 Frequency of micturition: Secondary | ICD-10-CM | POA: Diagnosis not present

## 2018-03-26 DIAGNOSIS — N401 Enlarged prostate with lower urinary tract symptoms: Secondary | ICD-10-CM | POA: Diagnosis present

## 2018-03-26 DIAGNOSIS — I10 Essential (primary) hypertension: Secondary | ICD-10-CM | POA: Diagnosis not present

## 2018-03-26 DIAGNOSIS — R627 Adult failure to thrive: Secondary | ICD-10-CM | POA: Diagnosis not present

## 2018-03-26 DIAGNOSIS — R1310 Dysphagia, unspecified: Secondary | ICD-10-CM

## 2018-03-26 LAB — CBC WITH DIFFERENTIAL/PLATELET
ABS IMMATURE GRANULOCYTES: 0 10*3/uL (ref 0.0–0.1)
BASOS ABS: 0 10*3/uL (ref 0.0–0.1)
Basophils Relative: 1 %
Eosinophils Absolute: 0 10*3/uL (ref 0.0–0.7)
Eosinophils Relative: 1 %
HCT: 42.4 % (ref 39.0–52.0)
Hemoglobin: 13.3 g/dL (ref 13.0–17.0)
Immature Granulocytes: 0 %
Lymphocytes Relative: 32 %
Lymphs Abs: 1.4 10*3/uL (ref 0.7–4.0)
MCH: 31.4 pg (ref 26.0–34.0)
MCHC: 31.4 g/dL (ref 30.0–36.0)
MCV: 100.2 fL — AB (ref 78.0–100.0)
MONO ABS: 0.3 10*3/uL (ref 0.1–1.0)
MONOS PCT: 7 %
NEUTROS ABS: 2.6 10*3/uL (ref 1.7–7.7)
Neutrophils Relative %: 59 %
Platelets: 195 10*3/uL (ref 150–400)
RBC: 4.23 MIL/uL (ref 4.22–5.81)
RDW: 12.5 % (ref 11.5–15.5)
WBC: 4.3 10*3/uL (ref 4.0–10.5)

## 2018-03-26 LAB — COMPREHENSIVE METABOLIC PANEL
ALT: 11 U/L (ref 0–44)
AST: 24 U/L (ref 15–41)
Albumin: 2.9 g/dL — ABNORMAL LOW (ref 3.5–5.0)
Alkaline Phosphatase: 71 U/L (ref 38–126)
Anion gap: 9 (ref 5–15)
BUN: 19 mg/dL (ref 8–23)
CHLORIDE: 96 mmol/L — AB (ref 98–111)
CO2: 27 mmol/L (ref 22–32)
CREATININE: 1.52 mg/dL — AB (ref 0.61–1.24)
Calcium: 8.8 mg/dL — ABNORMAL LOW (ref 8.9–10.3)
GFR calc Af Amer: 46 mL/min — ABNORMAL LOW (ref >60)
GFR calc non Af Amer: 39 mL/min — ABNORMAL LOW (ref >60)
Glucose, Bld: 90 mg/dL (ref 70–99)
Potassium: 4.1 mmol/L (ref 3.5–5.1)
SODIUM: 132 mmol/L — AB (ref 135–145)
Total Bilirubin: 0.7 mg/dL (ref 0.3–1.2)
Total Protein: 7.6 g/dL (ref 6.5–8.1)

## 2018-03-26 LAB — URINALYSIS, ROUTINE W REFLEX MICROSCOPIC
BACTERIA UA: NONE SEEN
BILIRUBIN URINE: NEGATIVE
GLUCOSE, UA: NEGATIVE mg/dL
Ketones, ur: NEGATIVE mg/dL
LEUKOCYTES UA: NEGATIVE
NITRITE: NEGATIVE
Protein, ur: 100 mg/dL — AB
SPECIFIC GRAVITY, URINE: 1.01 (ref 1.005–1.030)
pH: 5 (ref 5.0–8.0)

## 2018-03-26 LAB — PROTIME-INR
INR: 1.16
PROTHROMBIN TIME: 14.7 s (ref 11.4–15.2)

## 2018-03-26 LAB — I-STAT TROPONIN, ED
Troponin i, poc: 0.08 ng/mL (ref 0.00–0.08)
Troponin i, poc: 0.06 ng/mL (ref 0.00–0.08)

## 2018-03-26 LAB — I-STAT CG4 LACTIC ACID, ED
Lactic Acid, Venous: 2.49 mmol/L (ref 0.5–1.9)
Lactic Acid, Venous: 2.72 mmol/L (ref 0.5–1.9)

## 2018-03-26 LAB — LIPASE, BLOOD: Lipase: 35 U/L (ref 11–51)

## 2018-03-26 MED ORDER — HYDROCODONE-ACETAMINOPHEN 5-325 MG PO TABS
1.0000 | ORAL_TABLET | Freq: Four times a day (QID) | ORAL | Status: DC | PRN
  Administered 2018-03-27: 1 via ORAL
  Filled 2018-03-26: qty 1

## 2018-03-26 MED ORDER — DILTIAZEM HCL-DEXTROSE 100-5 MG/100ML-% IV SOLN (PREMIX)
5.0000 mg/h | Freq: Once | INTRAVENOUS | Status: AC
Start: 2018-03-26 — End: 2018-03-26
  Administered 2018-03-26: 5 mg/h via INTRAVENOUS
  Filled 2018-03-26: qty 100

## 2018-03-26 MED ORDER — ONDANSETRON HCL 4 MG/2ML IJ SOLN: 4.0000 mg | Freq: Four times a day (QID) | INTRAMUSCULAR | Status: DC | PRN

## 2018-03-26 MED ORDER — PIPERACILLIN-TAZOBACTAM 3.375 G IVPB 30 MIN
3.3750 g | Freq: Once | INTRAVENOUS | Status: AC
  Administered 2018-03-27: 3.375 g via INTRAVENOUS
  Filled 2018-03-26: qty 50

## 2018-03-26 MED ORDER — IOHEXOL 300 MG/ML  SOLN
100.0000 mL | Freq: Once | INTRAMUSCULAR | Status: AC | PRN
  Administered 2018-03-26: 100 mL via INTRAVENOUS

## 2018-03-26 MED ORDER — ACETAMINOPHEN 650 MG RE SUPP: 650.0000 mg | Freq: Four times a day (QID) | RECTAL | Status: DC | PRN

## 2018-03-26 MED ORDER — DILTIAZEM HCL 25 MG/5ML IV SOLN
15.0000 mg | Freq: Once | INTRAVENOUS | Status: AC
  Administered 2018-03-26: 15 mg via INTRAVENOUS
  Filled 2018-03-26: qty 5

## 2018-03-26 MED ORDER — SODIUM CHLORIDE 0.9 % IV BOLUS
500.0000 mL | Freq: Once | INTRAVENOUS | Status: AC
  Administered 2018-03-26: 500 mL via INTRAVENOUS

## 2018-03-26 MED ORDER — SODIUM CHLORIDE 0.9% FLUSH
3.0000 mL | Freq: Two times a day (BID) | INTRAVENOUS | Status: DC
Start: 2018-03-26 — End: 2018-03-29
  Administered 2018-03-27 – 2018-03-28 (×4): 3 mL via INTRAVENOUS

## 2018-03-26 MED ORDER — ENOXAPARIN SODIUM 40 MG/0.4ML ~~LOC~~ SOLN
40.0000 mg | SUBCUTANEOUS | Status: DC
  Administered 2018-03-27: 40 mg via SUBCUTANEOUS
  Filled 2018-03-26: qty 0.4

## 2018-03-26 MED ORDER — MYCOPHENOLATE SODIUM 180 MG PO TBEC
180.0000 mg | DELAYED_RELEASE_TABLET | Freq: Two times a day (BID) | ORAL | Status: DC
  Administered 2018-03-27 – 2018-03-29 (×4): 180 mg via ORAL
  Filled 2018-03-26 (×4): qty 1

## 2018-03-26 MED ORDER — ACETAMINOPHEN 325 MG PO TABS: 650.0000 mg | ORAL_TABLET | Freq: Four times a day (QID) | ORAL | Status: DC | PRN

## 2018-03-26 MED ORDER — ONDANSETRON HCL 4 MG PO TABS: 4.0000 mg | ORAL_TABLET | Freq: Four times a day (QID) | ORAL | Status: DC | PRN

## 2018-03-26 MED ORDER — PREDNISONE 5 MG PO TABS
5.0000 mg | ORAL_TABLET | Freq: Every day | ORAL | Status: DC
  Administered 2018-03-27 – 2018-03-29 (×3): 5 mg via ORAL
  Filled 2018-03-26 (×3): qty 1

## 2018-03-26 MED ORDER — VANCOMYCIN HCL IN DEXTROSE 1-5 GM/200ML-% IV SOLN
1000.0000 mg | Freq: Once | INTRAVENOUS | Status: AC
Start: 2018-03-26 — End: 2018-03-27
  Administered 2018-03-27: 1000 mg via INTRAVENOUS
  Filled 2018-03-26: qty 200

## 2018-03-26 MED ORDER — SODIUM CHLORIDE 0.9 % IV BOLUS
1000.0000 mL | Freq: Once | INTRAVENOUS | Status: AC
  Administered 2018-03-26: 1000 mL via INTRAVENOUS

## 2018-03-26 MED ORDER — ASPIRIN 81 MG PO CHEW
81.0000 mg | CHEWABLE_TABLET | Freq: Every day | ORAL | Status: DC
  Administered 2018-03-27 – 2018-03-29 (×3): 81 mg via ORAL
  Filled 2018-03-26 (×3): qty 1

## 2018-03-26 MED ORDER — CYCLOSPORINE MODIFIED (NEORAL) 100 MG PO CAPS: 125.0000 mg | ORAL_CAPSULE | Freq: Two times a day (BID) | ORAL | Status: DC

## 2018-03-26 NOTE — ED Notes (Signed)
Patient heart dropped below 60. Medication stopped per protocol paprameters. Hospitalist paged.

## 2018-03-26 NOTE — ED Provider Notes (Signed)
[facility] EMERGENCY DEPARTMENT Provider Note   CSN: [id number] Arrival date & time: 03/26/18  1508     History   Chief Complaint No chief complaint on file.   HPI [name] [name] is a 84 y.o. male with history of anemia of chronic disease, CKD status post renal transplant, diverticulosis, hypertension, hyperlipidemia presents accompanied by son with complaint of acute onset, progressively worsening generalized malaise and decreased appetite for 1 week.  Patient notes that he has had significantly decreased energy levels.  He does feel short of breath and has had intermittent left-sided chest pains but it is difficult for him to describe the circumstances surrounding the chest pain.  He is unsure if it is exertional or pleuritic.  He is unsure if he has orthopnea or PND.  Denies leg swelling.  He endorses subjective fevers and chills.  Also endorses intermittent left-sided abdominal pain and states he feels constipated.  Went to urgent care where he was found to be tachycardic and hypotensive with a blood pressure of 80/59 and sent to the ED for further evaluation.  Of note, patient's son mentions that he has had a 40 pound weight loss in the last 6 months.  He does note that he has had decreased oral intake.  The history is provided by the patient and a relative.    Past Medical History:  Diagnosis Date  . Anemia in CKD (chronic kidney disease)   . Arthritis   . Bilateral hydrocele   . CKD (chronic kidney disease), stage II    nephrologist-  deterding  . Diverticulosis   . Dysuria   . H/O kidney transplant    right 1980/  left 1994 with right transplanted kidney removal  . Hematuria   . History of adenomatous polyp of colon    VILLOUS ADENOMA POLYP  . History of asbestos exposure   . History of condyloma acuminatum    genital  . 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 History of renal cell carcinoma    S/P  BILATERAL NEPHRECTOMY AND  RENAL TRANSPLANTS  . History of small bowel obstruction    12/ 2006  due to adhesions -- resolved without surgerical intervention  . History of TIA (transient ischemic attack)   . Hyperlipidemia   . Hypertension   . Proteinuria   . Secondary hyperparathyroidism, renal ([place])   . Wears glasses     There are no active problems to display for this patient.   Past Surgical History:  Procedure Laterality Date  . COLONOSCOPY  10/17/2012   Procedure: COLONOSCOPY;  Surgeon: Beryle Beams, MD;  Location: [facility];  Service: Endoscopy;  Laterality: N/A;  . CYSTOSCOPY N/A 07/13/2015   Procedure: CYSTOSCOPY;  Surgeon: Irine Seal, MD;  Location: [facility];  Service: Urology;  Laterality: N/A;  . HEMICOLECTOMY Right 1995   adenoma polyp  . HYDROCELE EXCISION Bilateral 07/13/2015   Procedure: RIGHT HYDROCELECTOMY, ADULT DRAINAGE OF LEFT HYDROCELE;  Surgeon: Irine Seal, MD;  Location: [facility];  Service: Urology;  Laterality: Bilateral;  . KIDNEY TRANSPLANT  right 1980; left 1994---  [facility]   right kidney removed 8768/  left kidney removed 1994        Home Medications    Prior to Admission medications   Medication Sig Start Date End Date Taking? Authorizing Provider  aspirin 81 MG tablet Take 81 mg by mouth daily.    [provider]  atorvastatin (LIPITOR) 40 MG tablet Take 40 mg by mouth at bedtime.    [provider]  cephALEXin (KEFLEX) 500 MG capsule Take 1 capsule (500 mg total) by mouth 4 (four) times daily. 07/13/15   Irine Seal, MD  cycloSPORINE modified (NEORAL) 100 MG capsule Take 100 mg by mouth 2 (two) times daily. 100 mg in am/  25 mg in pm    [provider]  furosemide (LASIX) 20 MG tablet Take 40 mg by mouth as needed. As needed    [provider]  HYDROcodone-acetaminophen (NORCO) 5-325 MG tablet Take 1 tablet by mouth every 6 (six) hours as needed for moderate pain. 07/13/15   Irine Seal, MD  losartan  (COZAAR) 100 MG tablet Take by mouth 2 (two) times daily. Take one tab in am and 1/2 tab at bedtime    [provider]  mycophenolate (MYFORTIC) 180 MG EC tablet Take 180 mg by mouth 2 (two) times daily.    [provider]  predniSONE (DELTASONE) 5 MG tablet Take 5 mg by mouth daily with breakfast.    [provider]    Family History History reviewed. No pertinent family history.  Social History Social History   Tobacco Use  . Smoking status: Never Smoker  . Smokeless tobacco: Never Used  Substance Use Topics  . Alcohol use: No  . Drug use: No     Allergies   Ciprofloxacin; Clindamycin/lincomycin; Levaquin [levofloxacin]; and Septra [sulfamethoxazole-trimethoprim]   Review of Systems Review of Systems  Constitutional: Positive for chills, fatigue and fever.  Respiratory: Positive for shortness of breath.   Cardiovascular: Positive for chest pain.  Gastrointestinal: Positive for abdominal pain and constipation. Negative for diarrhea, nausea and vomiting.  Neurological: Negative for syncope.  All other systems reviewed and are negative.    Physical Exam Updated Vital Signs BP (!) 141/91   Pulse 70   Temp 98.7 F (37.1 C) (Oral)   Resp 19   SpO2 96%   Physical Exam  Constitutional: He appears well-developed and well-nourished. No distress.  Cachectic in appearance, resting comfortably in bed  HENT:  Head: Normocephalic and atraumatic.  Eyes: Conjunctivae are normal. Right eye exhibits no discharge. Left eye exhibits no discharge.  Neck: No JVD present. No tracheal deviation present.  Cardiovascular:  Irregularly irregular rhythm, 1+ radial and DP/PT pulses bilaterally, no lower extremity edema, no palpable cords, Homans sign absent bilaterally.  Pulmonary/Chest: [place]ort normal. He has rales.  Equal rise and fall of chest, speaking in full sentences.  Crackles noted in the right posterior lung base.  Abdominal: Soft. Bowel sounds are  normal. He exhibits no distension. There is tenderness.  Mild left upper quadrant left lower quadrant tenderness.  Abdomen is soft although there is some firmness noted suggestive of constipation.  No peritoneal signs  Musculoskeletal: He exhibits no edema.  Neurological: He is alert.  Skin: Skin is warm and dry. No erythema.  Psychiatric: He has a normal mood and affect. His behavior is normal.  Nursing note and vitals reviewed.    ED Treatments / Results  Labs (all labs ordered are listed, but only abnormal results are displayed) Labs Reviewed  COMPREHENSIVE METABOLIC PANEL - Abnormal; Notable for the following components:      Result Value   Sodium 132 (*)    Chloride 96 (*)    Creatinine, Ser 1.52 (*)    Calcium 8.8 (*)    Albumin 2.9 (*)    GFR calc non Af Wyvonnia Lora  39 (*)    GFR calc Af Amer 46 (*)    All other components within normal limits  CBC WITH DIFFERENTIAL/PLATELET - Abnormal; Notable for the following components:   MCV 100.2 (*)    All other components within normal limits  URINALYSIS, ROUTINE W REFLEX MICROSCOPIC - Abnormal; Notable for the following components:   Hgb urine dipstick MODERATE (*)    Protein, ur 100 (*)    All other components within normal limits  I-STAT CG4 LACTIC ACID, ED - Abnormal; Notable for the following components:   Lactic Acid, Venous 2.49 (*)    All other components within normal limits  I-STAT CG4 LACTIC ACID, ED - Abnormal; Notable for the following components:   Lactic Acid, Venous 2.72 (*)    All other components within normal limits  CULTURE, BLOOD (ROUTINE X 2)  CULTURE, BLOOD (ROUTINE X 2)  PROTIME-INR  LIPASE, BLOOD  I-STAT TROPONIN, ED  I-STAT TROPONIN, ED    EKG EKG Interpretation  Date/Time:  Tuesday March 26 2018 17:41:54 EDT Ventricular Rate:  122 PR Interval:    QRS Duration: 80 QT Interval:  333 QTC Calculation: 475 R Axis:   -25 Text Interpretation:  Atrial fibrillation Left ventricular hypertrophy Anterior Q  waves, possibly due to LVH Baseline wander in lead(s) V3 afib new Confirmed by Merrily Pew [phone]) on 03/26/2018 7:33:07 PM   Radiology Dg Chest 2 View  Result Date: 03/26/2018 CLINICAL DATA:  Hypotension. EXAM: CHEST - 2 VIEW COMPARISON:  CT abdomen and pelvis dated August 30, 2005. FINDINGS: The heart size and mediastinal contours are within normal limits. Normal pulmonary vascularity. Stable cystic changes at the left greater than right lung base. No focal consolidation, pleural effusion, or pneumothorax. Scattered calcified pleural plaques. No acute osseous abnormality. IMPRESSION: 1.  No active cardiopulmonary disease. 2. Scattered calcified pleural plaques, consistent with prior space cyst exposure. Electronically Signed   By: Titus Dubin M.D.   On: 03/26/2018 17:00   Ct Chest W Contrast  Result Date: 03/26/2018 CLINICAL DATA:  Failure to thrive, decreased appetite, weight loss, fatigue. EXAM: CT CHEST, ABDOMEN, AND PELVIS WITH CONTRAST TECHNIQUE: Multidetector CT imaging of the chest, abdomen and pelvis was performed following the standard protocol during bolus administration of intravenous contrast. CONTRAST:  180mL OMNIPAQUE IOHEXOL 300 MG/ML  SOLN COMPARISON:  CT of the abdomen and pelvis without contrast on 09/01/2005 FINDINGS: CT CHEST FINDINGS Cardiovascular: The aortic root measures approximately 4.4 cm. There is mild dilatation of the ascending thoracic aorta measuring 4.3 cm. The aortic arch is also mildly dilated at 3.8 cm and the descending thoracic aorta measures 3 cm. No evidence of aortic dissection. Proximal great vessels are normally patent. Right-sided heart chambers are enlarged. There is prominent reflux of contrast into the intrahepatic IVC and diffusely throughout the hepatic veins. Findings are consistent with right heart failure and elevated right heart pressures. No pericardial effusion. Scattered calcified plaque is noted in the coronary arteries. Central pulmonary  arteries are normal in caliber. Mediastinum/Nodes: No enlarged mediastinal, hilar, or axillary lymph nodes. Thyroid gland, trachea, and esophagus demonstrate no significant findings. Lungs/Pleura: Multiple calcified pleural plaques are present bilaterally consistent with asbestos exposure. There is significant emphysematous lung disease and cystic disease at the bases. Upper lung zones bilaterally as well as the superior segment of the left lower lobe demonstrate mild patchy areas of ground-glass opacity. This is felt to most likely be on the basis of mild pulmonary edema. No focal pulmonary masses. No pneumothorax. No  pleural fluid identified. Musculoskeletal: There are some focal dense sclerotic areas in the ribs including the right anterior third, right fourth, left first, left third and left fifth ribs. No other bony lesions identified in the chest. Vertebral bodies show no evidence of fracture. CT ABDOMEN PELVIS FINDINGS Hepatobiliary: No focal liver abnormality is seen. No gallstones, gallbladder wall thickening, or biliary dilatation. Pancreas: There is diffuse dilatation of the pancreatic duct up to approximately 5 mm. The duct appears smooth and regular with no side branch dilatation. No evidence of visible mass, significant pancreatic atrophy or ductal calculi. No evidence of overt pancreatitis or surrounding fluid collections. Spleen: Normal in size without focal abnormality. Adrenals/Urinary Tract: Status post prior bilateral nephrectomy. A left pelvic transplant kidney is present. This demonstrates no hydronephrosis. Delayed imaging shows excretion of contrast by the transplant kidney and flow of contrast into the transplant ureter and bladder. No adrenal masses. Stomach/Bowel: Bowel shows no evidence of obstruction. There may be some mild wall thickening involving the distal stomach and/or proximal duodenum. No evidence of free air. Diffuse diverticulosis present of the sigmoid colon without evidence  of diverticulitis. Vascular/Lymphatic: Atherosclerosis of the distal aorta and iliac arteries without evidence of aneurysmal disease. Reproductive: Mild prostate enlargement. Other: No abdominal wall hernia or abnormality. No abdominopelvic ascites. Musculoskeletal: Area of sclerosis in the right superior pubic bone. Mild loss of height of the L3 and L4 vertebral bodies with superior endplate compression. No other bony lesions identified. IMPRESSION: 1. Mild aneurysmal disease of the ascending thoracic aorta measuring 4.3 cm in greatest diameter. No dissection. Recommend annual imaging followup by CTA or MRA. This recommendation follows 2010 ACCF/AHA/AATS/ACR/ASA/SCA/SCAI/SIR/STS/SVM Guidelines for the Diagnosis and Management of Patients with Thoracic Aortic Disease. Circulation. 2010; 121: E315-Q008 2. Evidence of significant right heart failure. Probable early alveolar edema in the upper lung zones bilaterally. 3. Sclerotic foci in several bilateral ribs as well as the right superior pubic bone. These are fairly well-circumscribed and very dense. These may all represent benign bone islands. Sclerotic bony metastatic disease cannot be entirely excluded and correlation with PSA is recommended. 4. Evidence of prior asbestos exposure with multiple bilateral calcified pleural plaques. Associated significant chronic lung disease with advanced cystic changes at the lung bases bilaterally. 5. Unexplained diffuse dilatation of the pancreatic duct without obvious focal mass. This may be on the basis of prior pancreatitis. 6. 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 7. Possible mild wall thickening involving the distal stomach and/or proximal duodenum. This may be on the basis of peptic disease. Electronically Signed   By: Aletta Edouard M.D.   On: 03/26/2018 21:02   Ct Abdomen Pelvis W Contrast  Result Date: 03/26/2018 CLINICAL DATA:  Failure to thrive, decreased appetite,  weight loss, fatigue. EXAM: CT CHEST, ABDOMEN, AND PELVIS WITH CONTRAST TECHNIQUE: Multidetector CT imaging of the chest, abdomen and pelvis was performed following the standard protocol during bolus administration of intravenous contrast. CONTRAST:  127mL OMNIPAQUE IOHEXOL 300 MG/ML  SOLN COMPARISON:  CT of the abdomen and pelvis without contrast on 09/01/2005 FINDINGS: CT CHEST FINDINGS Cardiovascular: The aortic root measures approximately 4.4 cm. There is mild dilatation of the ascending thoracic aorta measuring 4.3 cm. The aortic arch is also mildly dilated at 3.8 cm and the descending thoracic aorta measures 3 cm. No evidence of aortic dissection. Proximal great vessels are normally patent. Right-sided heart chambers are enlarged. There is prominent reflux of contrast into the intrahepatic IVC and diffusely throughout the hepatic  veins. Findings are consistent with right heart failure and elevated right heart pressures. No pericardial effusion. Scattered calcified plaque is noted in the coronary arteries. Central pulmonary arteries are normal in caliber. Mediastinum/Nodes: No enlarged mediastinal, hilar, or axillary lymph nodes. Thyroid gland, trachea, and esophagus demonstrate no significant findings. Lungs/Pleura: Multiple calcified pleural plaques are present bilaterally consistent with asbestos exposure. There is significant emphysematous lung disease and cystic disease at the bases. Upper lung zones bilaterally as well as the superior segment of the left lower lobe demonstrate mild patchy areas of ground-glass opacity. This is felt to most likely be on the basis of mild pulmonary edema. No focal pulmonary masses. No pneumothorax. No pleural fluid identified. Musculoskeletal: There are some focal dense sclerotic areas in the ribs including the right anterior third, right fourth, left first, left third and left fifth ribs. No other bony lesions identified in the chest. Vertebral bodies show no evidence of  fracture. CT ABDOMEN PELVIS FINDINGS Hepatobiliary: No focal liver abnormality is seen. No gallstones, gallbladder wall thickening, or biliary dilatation. Pancreas: There is diffuse dilatation of the pancreatic duct up to approximately 5 mm. The duct appears smooth and regular with no side branch dilatation. No evidence of visible mass, significant pancreatic atrophy or ductal calculi. No evidence of overt pancreatitis or surrounding fluid collections. Spleen: Normal in size without focal abnormality. Adrenals/Urinary Tract: Status post prior bilateral nephrectomy. A left pelvic transplant kidney is present. This demonstrates no hydronephrosis. Delayed imaging shows excretion of contrast by the transplant kidney and flow of contrast into the transplant ureter and bladder. No adrenal masses. Stomach/Bowel: Bowel shows no evidence of obstruction. There may be some mild wall thickening involving the distal stomach and/or proximal duodenum. No evidence of free air. Diffuse diverticulosis present of the sigmoid colon without evidence of diverticulitis. Vascular/Lymphatic: Atherosclerosis of the distal aorta and iliac arteries without evidence of aneurysmal disease. Reproductive: Mild prostate enlargement. Other: No abdominal wall hernia or abnormality. No abdominopelvic ascites. Musculoskeletal: Area of sclerosis in the right superior pubic bone. Mild loss of height of the L3 and L4 vertebral bodies with superior endplate compression. No other bony lesions identified. IMPRESSION: 1. Mild aneurysmal disease of the ascending thoracic aorta measuring 4.3 cm in greatest diameter. No dissection. Recommend annual imaging followup by CTA or MRA. This recommendation follows 2010 ACCF/AHA/AATS/ACR/ASA/SCA/SCAI/SIR/STS/SVM Guidelines for the Diagnosis and Management of Patients with Thoracic Aortic Disease. Circulation. 2010; 121: U272-Z366 2. Evidence of significant right heart failure. Probable early alveolar edema in the upper  lung zones bilaterally. 3. Sclerotic foci in several bilateral ribs as well as the right superior pubic bone. These are fairly well-circumscribed and very dense. These may all represent benign bone islands. Sclerotic bony metastatic disease cannot be entirely excluded and correlation with PSA is recommended. 4. Evidence of prior asbestos exposure with multiple bilateral calcified pleural plaques. Associated significant chronic lung disease with advanced cystic changes at the lung bases bilaterally. 5. Unexplained diffuse dilatation of the pancreatic duct without obvious focal mass. This may be on the basis of prior pancreatitis. 6. 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 7. Possible mild wall thickening involving the distal stomach and/or proximal duodenum. This may be on the basis of peptic disease. Electronically Signed   By: Aletta Edouard M.D.   On: 03/26/2018 21:02    Procedures .Critical Care Performed by: Renita Papa, PA-C Authorized by: Renita Papa, PA-C   Critical care provider statement:    Critical care  time (minutes):  40   Critical care was necessary to treat or prevent imminent or life-threatening deterioration of the following conditions:  Dehydration and cardiac failure   Critical care was time spent personally by me on the following activities:  Development of treatment plan with patient or surrogate, discussions with consultants, evaluation of patient's response to treatment, re-evaluation of patient's condition, review of old charts, ordering and performing treatments and interventions, ordering and review of laboratory studies and ordering and review of radiographic studies   I assumed direction of critical care for this patient from another provider in my specialty: no     (including critical care time)  Medications Ordered in ED Medications  sodium chloride 0.9 % bolus 500 mL ( Intravenous Stopped 03/26/18 1902)  sodium  chloride 0.9 % bolus 1,000 mL (0 mLs Intravenous Stopped 03/26/18 2045)  iohexol (OMNIPAQUE) 300 MG/ML solution 100 mL (100 mLs Intravenous Contrast Given 03/26/18 2008)  diltiazem (CARDIZEM) injection 15 mg (15 mg Intravenous Given 03/26/18 2106)  diltiazem (CARDIZEM) 100 mg in dextrose 5% 151mL (1 mg/mL) infusion (5 mg/hr Intravenous New Bag/Given 03/26/18 2120)     Initial Impression / Assessment and Plan / ED Course  I have reviewed the triage vital signs and the nursing notes.  Pertinent labs & imaging results that were available during my care of the patient were reviewed by me and considered in my medical decision making (see chart for details).     Patient presents from urgent care with hypotension and 1 week of progressively worsening malaise.  He has had generalized fatigue and left-sided chest pains and abdominal pains.  He is cachectic in appearance, has had 40 pound weight loss in the past 6 months or so.  He is in A. fib with RVR initially.  He has no history of A. fib.  CHA2DS2-VASc score of 3.  No improvement with IV fluids.  Rate did improve with diltiazem bolus and drip, but he did not spontaneously convert into normal sinus rhythm.  Concern for possible underlying malignancy from possible prostate cancer.  He has a markedly enlarged prostate on CT scan with possible metastatic bone lesions in the ribs.  Lab work shows no leukocytosis, no anemia no significant electrolyte abnormalities.  Creatinine elevated at 1.52, possibly dehydration related.  UA not concerning for nephrolithiasis or UTI. Serial troponins negative, no evidence for ACS/MI.  Elevated lactate and patient hypothermic with rectal temp, though patient does not appear to be septic at this time.  No infectious source identified.  Improved with [employer]. 10:02 PM Spoke with Dr. Tamala Julian with [facility] hospitalist service who agrees to assume care of patient and bring him to the hospital for further evaluation and management.   Patient seen and evaluated by Dr. Dayna Barker who agrees with assessment and plan at this time.  Final Clinical Impressions(s) / ED Diagnoses   Final diagnoses:  Atrial fibrillation with rapid ventricular response [facility])    ED Discharge Orders    None       Debroah Baller 03/26/18 2310    Mesner, Corene Cornea, MD 03/26/18 2312

## 2018-03-26 NOTE — ED Triage Notes (Addendum)
Pt presents with 1 week h/o generalized malaise, no appetite.  Son reports pt has had no appetite, pt reports pain to L side abdomen and L neck, pt denies any recent fall.  Pt reports chills at home.

## 2018-03-26 NOTE — ED Notes (Signed)
ED Provider at bedside. [facility]

## 2018-03-26 NOTE — ED Notes (Signed)
EDP at bedside. [employer]

## 2018-03-26 NOTE — ED Notes (Signed)
EDP made aware of patients difficulty to urinate. Bladder scan confirmed 412 ml. Verbal orders received to insert foley.

## 2018-03-26 NOTE — ED Notes (Addendum)
Hospitalist at bedside.  Tamala Julian

## 2018-03-26 NOTE — ED Provider Notes (Signed)
[place]   [id number] 03/26/18 Arrival Time: 6761  ASSESSMENT & PLAN:  1. Hypotension, unspecified hypotension type   2. Tachycardia   Along with subjective fever.  Concern for sepsis. RN wheeling patient to ED. Stable upon discharge.  Reviewed expectations re: course of current medical issues. Questions answered. Outlined signs and symptoms indicating need for more acute intervention. Patient verbalized understanding. After Visit Summary given.   SUBJECTIVE: History from: patient and his son. (Mostly from son.) [name] [name] is a 84 y.o. male who presents with complaint of overall fatigue for the past week. "Just not feeling well." His son, who does not live with him, reports noticing a decrease in his father's energy level. No specific aggravating or alleviating factors reported. Subjective fever and chills daily. Decreased appetite and PO intake. Mild L-sided neck discomfort. Occasional loose stools without blood. Mild nausea without emesis. No urinary symptoms. No abdominal discomfort. Son reports no confusion or mental status changes. His father usually is very active. No recent URI/cold symptoms.  ROS: As per HPI. All other systems negative.   OBJECTIVE:  Vitals:   03/26/18 1442  BP: (!) 80/59  Pulse: (!) 112  Temp: 97.7 F (36.5 C)  TempSrc: Axillary    General appearance: alert; no distress but appears fatigued Eyes: PERRLA; EOMI; conjunctiva normal Lungs: able to speak full sentences; unlabored respirations Heart: tachycardia noted; regular Abdomen: soft, non-tender; bowel sounds normal Back: no CVA tenderness Extremities: no edema; symmetrical with no gross deformities Skin: warm and dry; nails thick Neurologic: normal gait; normal symmetric reflexes Psychological: alert and cooperative; normal mood and affect  Allergies  Allergen Reactions  . Ciprofloxacin Rash  . Clindamycin/Lincomycin Rash  . Levaquin [Levofloxacin] Rash  . Septra  [Sulfamethoxazole-Trimethoprim] Rash    Past Medical History:  Diagnosis Date  . Anemia in CKD (chronic kidney disease)   . Arthritis   . Bilateral hydrocele   . CKD (chronic kidney disease), stage II    nephrologist-  deterding  . Diverticulosis   . Dysuria   . H/O kidney transplant    right 1980/  left 1994 with right transplanted kidney removal  . Hematuria   . History of adenomatous polyp of colon    VILLOUS ADENOMA POLYP  . History of asbestos exposure   . History of condyloma acuminatum    genital  . 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 History of renal cell carcinoma    S/P  BILATERAL NEPHRECTOMY AND RENAL TRANSPLANTS  . History of small bowel obstruction    12/ 2006  due to adhesions -- resolved without surgerical intervention  . History of TIA (transient ischemic attack)   . Hyperlipidemia   . Hypertension   . Proteinuria   . Secondary hyperparathyroidism, renal ([place])   . Wears glasses    Social History   Socioeconomic History  . Marital status: Widowed    Spouse name: Not on file  . Number of children: Not on file  . Years of education: Not on file  . Highest education level: Not on file  Occupational History  . Not on file  Social Needs  . Financial resource strain: Not on file  . Food insecurity:    Worry: Not on file    Inability: Not on file  . Transportation needs:    Medical: Not on file    Non-medical: Not on file  Tobacco Use  . Smoking status: Never Smoker  .  Smokeless tobacco: Never Used  Substance and Sexual Activity  . Alcohol use: No  . Drug use: No  . Sexual activity: Not on file  Lifestyle  . Physical activity:    Days per week: Not on file    Minutes per session: Not on file  . Stress: Not on file  Relationships  . Social connections:    Talks on phone: Not on file    Gets together: Not on file    Attends religious service: Not on file    Active member of club or organization: Not on file     Attends meetings of clubs or organizations: Not on file    Relationship status: Not on file  . Intimate partner violence:    Fear of current or ex partner: Not on file    Emotionally abused: Not on file    Physically abused: Not on file    Forced sexual activity: Not on file  Other Topics Concern  . Not on file  Social History Narrative  . Not on file   FH: HTN  Past Surgical History:  Procedure Laterality Date  . COLONOSCOPY  10/17/2012   Procedure: COLONOSCOPY;  Surgeon: Beryle Beams, MD;  Location: [facility];  Service: Endoscopy;  Laterality: N/A;  . CYSTOSCOPY N/A 07/13/2015   Procedure: CYSTOSCOPY;  Surgeon: Irine Seal, MD;  Location: [facility];  Service: Urology;  Laterality: N/A;  . HEMICOLECTOMY Right 1995   adenoma polyp  . HYDROCELE EXCISION Bilateral 07/13/2015   Procedure: RIGHT HYDROCELECTOMY, ADULT DRAINAGE OF LEFT HYDROCELE;  Surgeon: Irine Seal, MD;  Location: [facility];  Service: Urology;  Laterality: Bilateral;  . KIDNEY TRANSPLANT  right 1980; left 1994---  [facility]   right kidney removed 1610/  left kidney removed 1994     Vanessa Kick, MD 03/26/18 1517

## 2018-03-26 NOTE — ED Triage Notes (Signed)
Pt is here with his son who does not live with him.  Pt lives with his daughter.  Pt states he has had a fever for about a week.  The fever has not been measured or treated.  Pt complains of left shoulder and neck pain as well.

## 2018-03-26 NOTE — ED Notes (Signed)
Spoke with [employer]. Notified of stopping Cardizem. Provider verified discontinuation of medication.

## 2018-03-26 NOTE — ED Notes (Signed)
ED Provider at bedside. 

## 2018-03-26 NOTE — ED Notes (Signed)
ED Provider at bedside. [employer]

## 2018-03-26 NOTE — ED Notes (Signed)
Patient back in room on [facility]

## 2018-03-26 NOTE — ED Notes (Signed)
Hospitalist paged regarding heart rate

## 2018-03-26 NOTE — ED Provider Notes (Signed)
Medical screening examination/treatment/procedure(s) were conducted as a shared visit with non-physician practitioner(s) and myself.  I personally evaluated the patient during the encounter.  He has subjective fevers over the last couple weeks, low blood pressure, malaise and fatigue.  Also on examination is found to be cachectic with a 30 to 40 pound weight loss over the last 6 months per the son's report.  Also A. fib with RVR.  Abdomen is tight but otherwise benign. Concern for possible malignancy secondary to weight loss fatigue and loss of appetite.  No obvious infection on exam or work-up appropriately will likely need admission.  None     Rose Hippler, Corene Cornea, MD 03/26/18 2312

## 2018-03-27 ENCOUNTER — Other Ambulatory Visit (HOSPITAL_COMMUNITY): Payer: Medicare Other

## 2018-03-27 ENCOUNTER — Encounter (HOSPITAL_COMMUNITY): Payer: Self-pay | Admitting: *Deleted

## 2018-03-27 ENCOUNTER — Inpatient Hospital Stay (HOSPITAL_COMMUNITY): Payer: Medicare Other

## 2018-03-27 DIAGNOSIS — M899 Disorder of bone, unspecified: Secondary | ICD-10-CM

## 2018-03-27 DIAGNOSIS — I351 Nonrheumatic aortic (valve) insufficiency: Secondary | ICD-10-CM

## 2018-03-27 DIAGNOSIS — I361 Nonrheumatic tricuspid (valve) insufficiency: Secondary | ICD-10-CM

## 2018-03-27 DIAGNOSIS — E43 Unspecified severe protein-calorie malnutrition: Secondary | ICD-10-CM

## 2018-03-27 DIAGNOSIS — I714 Abdominal aortic aneurysm, without rupture, unspecified: Secondary | ICD-10-CM | POA: Diagnosis present

## 2018-03-27 DIAGNOSIS — R634 Abnormal weight loss: Secondary | ICD-10-CM

## 2018-03-27 DIAGNOSIS — I959 Hypotension, unspecified: Secondary | ICD-10-CM

## 2018-03-27 DIAGNOSIS — R350 Frequency of micturition: Secondary | ICD-10-CM

## 2018-03-27 DIAGNOSIS — R6510 Systemic inflammatory response syndrome (SIRS) of non-infectious origin without acute organ dysfunction: Secondary | ICD-10-CM

## 2018-03-27 DIAGNOSIS — N400 Enlarged prostate without lower urinary tract symptoms: Secondary | ICD-10-CM | POA: Diagnosis present

## 2018-03-27 DIAGNOSIS — I4891 Unspecified atrial fibrillation: Principal | ICD-10-CM

## 2018-03-27 DIAGNOSIS — N401 Enlarged prostate with lower urinary tract symptoms: Secondary | ICD-10-CM

## 2018-03-27 LAB — BASIC METABOLIC PANEL
ANION GAP: 8 (ref 5–15)
BUN: 19 mg/dL (ref 8–23)
CALCIUM: 8.5 mg/dL — AB (ref 8.9–10.3)
CO2: 25 mmol/L (ref 22–32)
CREATININE: 1.41 mg/dL — AB (ref 0.61–1.24)
Chloride: 101 mmol/L (ref 98–111)
GFR calc Af Amer: 50 mL/min — ABNORMAL LOW (ref >60)
GFR, EST NON AFRICAN AMERICAN: 43 mL/min — AB (ref >60)
Glucose, Bld: 72 mg/dL (ref 70–99)
Potassium: 4 mmol/L (ref 3.5–5.1)
SODIUM: 134 mmol/L — AB (ref 135–145)

## 2018-03-27 LAB — CBC
HEMATOCRIT: 36.2 % — AB (ref 39.0–52.0)
Hemoglobin: 11.7 g/dL — ABNORMAL LOW (ref 13.0–17.0)
MCH: 31.4 pg (ref 26.0–34.0)
MCHC: 32.3 g/dL (ref 30.0–36.0)
MCV: 97.1 fL (ref 78.0–100.0)
PLATELETS: 178 10*3/uL (ref 150–400)
RBC: 3.73 MIL/uL — ABNORMAL LOW (ref 4.22–5.81)
RDW: 12.3 % (ref 11.5–15.5)
WBC: 5.2 10*3/uL (ref 4.0–10.5)

## 2018-03-27 LAB — MAGNESIUM
MAGNESIUM: 2.1 mg/dL (ref 1.7–2.4)
Magnesium: 1.6 mg/dL — ABNORMAL LOW (ref 1.7–2.4)

## 2018-03-27 LAB — TROPONIN I
TROPONIN I: 0.08 ng/mL — AB (ref <0.03)
TROPONIN I: 0.09 ng/mL — AB (ref <0.03)
Troponin I: 0.07 ng/mL (ref <0.03)

## 2018-03-27 LAB — LACTIC ACID, PLASMA: Lactic Acid, Venous: 1.4 mmol/L (ref 0.5–1.9)

## 2018-03-27 LAB — ECHOCARDIOGRAM COMPLETE
Height: 75 in
Weight: 2130.53 oz

## 2018-03-27 LAB — PROCALCITONIN: Procalcitonin: <0.1 ng/mL

## 2018-03-27 LAB — MRSA PCR SCREENING: MRSA BY PCR: NEGATIVE

## 2018-03-27 LAB — TSH: TSH: 1.972 u[IU]/mL (ref 0.350–4.500)

## 2018-03-27 LAB — SEDIMENTATION RATE: SED RATE: 46 mm/h — AB (ref 0–16)

## 2018-03-27 LAB — C-REACTIVE PROTEIN: CRP: 2.9 mg/dL — ABNORMAL HIGH (ref <1.0)

## 2018-03-27 MED ORDER — CYCLOSPORINE MODIFIED (NEORAL) 100 MG PO CAPS
100.0000 mg | ORAL_CAPSULE | Freq: Two times a day (BID) | ORAL | Status: DC
  Administered 2018-03-27 – 2018-03-28 (×2): 100 mg via ORAL
  Filled 2018-03-27 (×2): qty 1

## 2018-03-27 MED ORDER — CYCLOSPORINE MODIFIED (NEORAL) 25 MG PO CAPS
25.0000 mg | ORAL_CAPSULE | Freq: Two times a day (BID) | ORAL | Status: DC
  Administered 2018-03-27 – 2018-03-28 (×2): 25 mg via ORAL
  Filled 2018-03-27 (×2): qty 1

## 2018-03-27 MED ORDER — SODIUM CHLORIDE 0.9 % IV BOLUS
500.0000 mL | Freq: Once | INTRAVENOUS | Status: AC
  Administered 2018-03-27: 500 mL via INTRAVENOUS

## 2018-03-27 MED ORDER — SODIUM CHLORIDE 0.9 % IV SOLN
INTRAVENOUS | Status: DC | PRN
  Administered 2018-03-27: via INTRAVENOUS

## 2018-03-27 MED ORDER — FAMOTIDINE IN NACL 20-0.9 MG/50ML-% IV SOLN
20.0000 mg | Freq: Two times a day (BID) | INTRAVENOUS | Status: DC
  Administered 2018-03-27 (×2): 20 mg via INTRAVENOUS
  Filled 2018-03-27 (×2): qty 50

## 2018-03-27 MED ORDER — APIXABAN 2.5 MG PO TABS
2.5000 mg | ORAL_TABLET | Freq: Two times a day (BID) | ORAL | Status: DC
  Administered 2018-03-27 – 2018-03-29 (×4): 2.5 mg via ORAL
  Filled 2018-03-27 (×4): qty 1

## 2018-03-27 MED ORDER — PIPERACILLIN-TAZOBACTAM 3.375 G IVPB
3.3750 g | Freq: Three times a day (TID) | INTRAVENOUS | Status: DC
  Administered 2018-03-27 – 2018-03-29 (×7): 3.375 g via INTRAVENOUS
  Filled 2018-03-27 (×9): qty 50

## 2018-03-27 MED ORDER — DILTIAZEM HCL-DEXTROSE 100-5 MG/100ML-% IV SOLN (PREMIX)
5.0000 mg/h | INTRAVENOUS | Status: DC
  Administered 2018-03-27: 5 mg/h via INTRAVENOUS
  Filled 2018-03-27: qty 100

## 2018-03-27 MED ORDER — MAGNESIUM SULFATE 2 GM/50ML IV SOLN
2.0000 g | Freq: Once | INTRAVENOUS | Status: AC
  Administered 2018-03-27: 2 g via INTRAVENOUS
  Filled 2018-03-27: qty 50

## 2018-03-27 MED ORDER — DILTIAZEM HCL 30 MG PO TABS
30.0000 mg | ORAL_TABLET | Freq: Four times a day (QID) | ORAL | Status: DC
  Administered 2018-03-27 – 2018-03-28 (×3): 30 mg via ORAL
  Filled 2018-03-27 (×4): qty 1

## 2018-03-27 MED ORDER — SODIUM CHLORIDE 0.9 % IV SOLN
INTRAVENOUS | Status: DC | PRN
  Administered 2018-03-27 (×2): via INTRAVENOUS

## 2018-03-27 MED ORDER — VANCOMYCIN HCL IN DEXTROSE 750-5 MG/150ML-% IV SOLN
750.0000 mg | INTRAVENOUS | Status: DC
  Administered 2018-03-27: 750 mg via INTRAVENOUS
  Filled 2018-03-27: qty 150

## 2018-03-27 MED ORDER — CYCLOSPORINE MODIFIED (NEORAL) 25 MG PO CAPS: 125.0000 mg | ORAL_CAPSULE | Freq: Two times a day (BID) | ORAL | Status: DC

## 2018-03-27 MED ORDER — ENSURE ENLIVE PO LIQD
237.0000 mL | Freq: Three times a day (TID) | ORAL | Status: DC
  Administered 2018-03-27 – 2018-03-29 (×4): 237 mL via ORAL

## 2018-03-27 NOTE — Progress Notes (Signed)
Initial Nutrition Assessment  DOCUMENTATION CODES:   Severe malnutrition in context of chronic illness, Underweight  INTERVENTION:    Ensure Enlive po TID, each supplement provides 350 kcal and 20 grams of protein  NUTRITION DIAGNOSIS:   Severe Malnutrition related to catabolic illness(concern for malignancy) as evidenced by percent weight loss, severe fat depletion, severe muscle depletion  GOAL:   Patient will meet greater than or equal to 90% of their needs  MONITOR:   PO intake, Supplement acceptance, Labs, Skin, Weight trends, I & O's  REASON FOR ASSESSMENT:   Malnutrition Screening Tool  ASSESSMENT:   84 y.o. Male with PMH significant of HTN, anemia of chronic disease, CKD s/p renal transplant; admitted with SIRS/sepsis; concern for underlying infection; started on IV ABX.  RD spoke with pt's daughter at bedside. She is assisting him eat lunch (spaghetti). Daughter reports PTA pt wasn't eating well. She states "he would make attempts to eat but wouldn't finish". Daughter also believe pt's lost weight, however, she is unable to quantify amount or time frame.  Pt H&P other family members reported pt has had up to 40 lbs of weight loss x 6 months (23%). Severe for time frame. Pt typically does not drink any nutrition supplements at home. Amenable to receive here. Labs and medications reviewed. Na 134 (L). Mg and K WNL.  NUTRITION - FOCUSED PHYSICAL EXAM:    Most Recent Value  Orbital Region  Mild depletion  Upper Arm Region  Severe depletion  Thoracic and Lumbar Region  Unable to assess  Buccal Region  Mild depletion  Temple Region  Mild depletion  Clavicle Bone Region  Severe depletion  Clavicle and Acromion Bone Region  Severe depletion  Scapular Bone Region  Unable to assess  Dorsal Hand  Severe depletion  Patellar Region  Severe depletion  Anterior Thigh Region  Severe depletion  Posterior Calf Region  Severe depletion  Edema (RD Assessment)  None      Diet Order:   Diet Order           DIET SOFT Room service appropriate? Yes; Fluid consistency: Thin  Diet effective now         EDUCATION NEEDS:   No education needs have been identified at this time  Skin:  Skin Assessment: Reviewed RN Assessment  Last BM:  7/16  Height:   Ht Readings from Last 1 Encounters:  03/27/18 6\' 3"  (1.905 m)   Weight:   Wt Readings from Last 1 Encounters:  03/27/18 133 lb 2.5 oz (60.4 kg)   Ideal Body Weight:  89 kg  BMI:  Body mass index is 16.64 kg/m.  Estimated Nutritional Needs:   Kcal:  1500-1700  Protein:  70-85 gm  Fluid:  1.5-1.7 L  [name] [name], RD, LDN Pager #: [phone] After-Hours Pager #: [phone]

## 2018-03-27 NOTE — Consult Note (Signed)
Cardiology Consultation:   Patient ID: [name] [name]; [id number]; 1927/10/24   Admit date: 03/26/2018 Date of Consult: 03/27/2018  Primary Care Provider: Mauricia Area, MD Primary Cardiologist: New to Dr. Debara Pickett  Patient Profile:   [name] [name] is a 84 y.o. male with a hx of HTN, CKD s/p renal transplant (followed by Dr. Jimmy Footman), HLD and TIA  who is being seen today for the evaluation of Afib at the request of Dr. Tamala Julian.   No prior cardiac hx. Grandmother, mother and uncle has hx of MI.   History of Present Illness:   [name] [name] was in [place] up until few weeks ago when family has noted slowing down, fatigue and weakness. Worsen in past 3-4 days. No chest pain, SOB, palpitations, LE edema, orthopnea, PND, dizziness of syncope. Likes with daughter. Does yard work and walking without any difficulty until recent weakness. Denies tobacco smoking or alcohol drinking. Worked in Agricultural engineer. Noted chills without fever.had Left sided flank pain and urinary frequency.  40lb weight loss in 6 months.   IN ER, noted hypotensive at 94.6>>> improved after bear hugger placement. Elevated lactic acid. Found afib RVR with hypotension. Give bolus of fluid with improved blood pressure. HR improved on IV Cardizem. UA negative for acute abnormality. Patient on chronic immunosuppressive agents.   Chest x-ray showed no clear infiltrate, and scattered calcified pleural plaques consistent with prior space cysts exposure.  Elevated CRP and sed rate.  CT of abdomen and pelvic IMPRESSION: 1. Mild aneurysmal disease of the ascending thoracic aorta measuring 4.3 cm in greatest diameter. No dissection. Recommend annual imaging followup by CTA or MRA. This recommendation follows 2010 ACCF/AHA/AATS/ACR/ASA/SCA/SCAI/SIR/STS/SVM Guidelines for the Diagnosis and Management of Patients with Thoracic Aortic Disease. Circulation. 2010; 121: O130-Q657 2. Evidence of significant right heart failure. Probable  early alveolar edema in the upper lung zones bilaterally. 3. Sclerotic foci in several bilateral ribs as well as the right superior pubic bone. These are fairly well-circumscribed and very dense. These may all represent benign bone islands. Sclerotic bony metastatic disease cannot be entirely excluded and correlation with PSA is recommended. 4. Evidence of prior asbestos exposure with multiple bilateral calcified pleural plaques. Associated significant chronic lung disease with advanced cystic changes at the lung bases bilaterally. 5. Unexplained diffuse dilatation of the pancreatic duct without obvious focal mass. This may be on the basis of prior pancreatitis. 6. 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 7. Possible mild wall thickening involving the distal stomach and/or proximal duodenum. This may be on the basis of peptic disease.     Past Medical History:  Diagnosis Date  . Anemia in CKD (chronic kidney disease)   . Arthritis   . Bilateral hydrocele   . CKD (chronic kidney disease), stage II    nephrologist-  deterding  . Diverticulosis   . Dysuria   . H/O kidney transplant    right 1980/  left 1994 with right transplanted kidney removal  . Hematuria   . History of adenomatous polyp of colon    VILLOUS ADENOMA POLYP  . History of asbestos exposure   . History of condyloma acuminatum    genital  . 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 History of renal cell carcinoma    S/P  BILATERAL NEPHRECTOMY AND RENAL TRANSPLANTS  . History of small bowel obstruction    12/ 2006  due to adhesions -- resolved without surgerical intervention  . History  of TIA (transient ischemic attack)   . Hyperlipidemia   . Hypertension   . Proteinuria   . Secondary hyperparathyroidism, renal ([place])   . Wears glasses     Past Surgical History:  Procedure Laterality Date  . COLONOSCOPY  10/17/2012   Procedure:  COLONOSCOPY;  Surgeon: Beryle Beams, MD;  Location: [facility];  Service: Endoscopy;  Laterality: N/A;  . CYSTOSCOPY N/A 07/13/2015   Procedure: CYSTOSCOPY;  Surgeon: Irine Seal, MD;  Location: [facility];  Service: Urology;  Laterality: N/A;  . HEMICOLECTOMY Right 1995   adenoma polyp  . HYDROCELE EXCISION Bilateral 07/13/2015   Procedure: RIGHT HYDROCELECTOMY, ADULT DRAINAGE OF LEFT HYDROCELE;  Surgeon: Irine Seal, MD;  Location: [facility];  Service: Urology;  Laterality: Bilateral;  . KIDNEY TRANSPLANT  right 1980; left 1994---  [facility]   right kidney removed 3704/  left kidney removed 1994    Inpatient Medications: Scheduled Meds: . aspirin  81 mg Oral Daily  . cycloSPORINE modified  100 mg Oral BID  . enoxaparin (LOVENOX) injection  40 mg Subcutaneous Q24H  . feeding supplement (ENSURE ENLIVE)  237 mL Oral TID BM  . mycophenolate  180 mg Oral BID  . predniSONE  5 mg Oral Q breakfast  . sodium chloride flush  3 mL Intravenous Q12H   Continuous Infusions: . sodium chloride 10 mL/hr at 03/27/18 0004  . sodium chloride 10 mL/hr at 03/27/18 0004  . diltiazem (CARDIZEM) infusion 7.5 mg/hr (03/27/18 1147)  . famotidine (PEPCID) IV 20 mg (03/27/18 1144)  . piperacillin-tazobactam (ZOSYN)  IV 3.375 g (03/27/18 1411)  . vancomycin     PRN Meds: sodium chloride, sodium chloride, acetaminophen **OR** acetaminophen, HYDROcodone-acetaminophen, ondansetron **OR** ondansetron (ZOFRAN) IV  Allergies:    Allergies  Allergen Reactions  . Ciprofloxacin Rash  . Clindamycin/Lincomycin Rash  . Levaquin [Levofloxacin] Rash  . Septra [Sulfamethoxazole-Trimethoprim] Rash    Social History:   Social History   Socioeconomic History  . Marital status: Widowed    Spouse name: Not on file  . Number of children: Not on file  . Years of education: Not on file  . Highest education level: Not on file  Occupational History  . Not on file  Social Needs  .  Financial resource strain: Not on file  . Food insecurity:    Worry: Not on file    Inability: Not on file  . Transportation needs:    Medical: Not on file    Non-medical: Not on file  Tobacco Use  . Smoking status: Never Smoker  . Smokeless tobacco: Never Used  Substance and Sexual Activity  . Alcohol use: No  . Drug use: No  . Sexual activity: Not Currently    Birth control/protection: None  Lifestyle  . Physical activity:    Days per week: Not on file    Minutes per session: Not on file  . Stress: Not on file  Relationships  . Social connections:    Talks on phone: Not on file    Gets together: Not on file    Attends religious service: Not on file    Active member of club or organization: Not on file    Attends meetings of clubs or organizations: Not on file    Relationship status: Not on file  . Intimate partner violence:    Fear of current or ex partner: Not on file    Emotionally abused: Not on file    Physically abused: Not on  file    Forced sexual activity: Not on file  Other Topics Concern  . Not on file  Social History Narrative  . Not on file    Family History:   As above  ROS:  Please see the history of present illness.  All other ROS reviewed and negative.     Physical Exam/Data:   Vitals:   03/27/18 0741 03/27/18 0900 03/27/18 1112 03/27/18 1521  BP: (!) 128/91 (!) 127/114 (!) 121/96 99/78  Pulse: 98 (!) 143 (!) 126 74  Resp: 14  (!) 22 20  Temp: 97.7 F (36.5 C)  98.3 F (36.8 C) 97.7 F (36.5 C)  TempSrc: Oral  Oral Oral  SpO2: 100% 100% 100% 99%  Weight:      Height:        Intake/Output Summary (Last 24 hours) at 03/27/2018 1534 Last data filed at 03/27/2018 0651 Gross per 24 hour  Intake 2574.93 ml  Output 1070 ml  Net 1504.93 ml   Filed Weights   03/27/18 0100  Weight: 133 lb 2.5 oz (60.4 kg)   Body mass index is 16.64 kg/m.  General:  Thin frail ill appearing male in NAD HEENT: normal Lymph: no adenopathy Neck: no  JVD Endocrine:  No thryomegaly Vascular: No carotid bruits; FA pulses 2+ bilaterally without bruits  Cardiac:  normal S1, S2; RRR; no murmur  Lungs:  clear to auscultation bilaterally, no wheezing, rhonchi or rales  Abd: soft, nontender, no hepatomegaly  Ext: no edema Musculoskeletal:  No deformities, BUE and BLE strength normal and equal Skin: warm and dry  Neuro:  CNs 2-12 intact, no focal abnormalities noted Psych:  Normal affect   EKG:  The EKG was personally reviewed and demonstrates:  afib at rate of 122 bpm, LVH and TWI in lateral leads  Telemetry:  Telemetry was personally reviewed and demonstrates:  afib at rate of 60s  Relevant CV Studies: Pending   Laboratory Data:  Chemistry Recent Labs  Lab 03/26/18 1616 03/27/18 0525  NA 132* 134*  K 4.1 4.0  CL 96* 101  CO2 27 25  GLUCOSE 90 72  BUN 19 19  CREATININE 1.52* 1.41*  CALCIUM 8.8* 8.5*  GFRNONAA 39* 43*  GFRAA 46* 50*  ANIONGAP 9 8    Recent Labs  Lab 03/26/18 1616  PROT 7.6  ALBUMIN 2.9*  AST 24  ALT 11  ALKPHOS 71  BILITOT 0.7   Hematology Recent Labs  Lab 03/26/18 1616 03/27/18 0525  WBC 4.3 5.2  RBC 4.23 3.73*  HGB 13.3 11.7*  HCT 42.4 36.2*  MCV 100.2* 97.1  MCH 31.4 31.4  MCHC 31.4 32.3  RDW 12.5 12.3  PLT 195 178   Cardiac Enzymes Recent Labs  Lab 03/26/18 2344 03/27/18 0525 03/27/18 0928  TROPONINI 0.08* 0.09* 0.07*    Recent Labs  Lab 03/26/18 1807 03/26/18 2200  TROPIPOC 0.08 0.06    Radiology/Studies:  Dg Chest 2 View  Result Date: 03/26/2018 CLINICAL DATA:  Hypotension. EXAM: CHEST - 2 VIEW COMPARISON:  CT abdomen and pelvis dated August 30, 2005. FINDINGS: The heart size and mediastinal contours are within normal limits. Normal pulmonary vascularity. Stable cystic changes at the left greater than right lung base. No focal consolidation, pleural effusion, or pneumothorax. Scattered calcified pleural plaques. No acute osseous abnormality. IMPRESSION: 1.  No active  cardiopulmonary disease. 2. Scattered calcified pleural plaques, consistent with prior space cyst exposure. Electronically Signed   By: Titus Dubin M.D.   On: 03/26/2018  17:00   Ct Chest W Contrast  Result Date: 03/26/2018 CLINICAL DATA:  Failure to thrive, decreased appetite, weight loss, fatigue. EXAM: CT CHEST, ABDOMEN, AND PELVIS WITH CONTRAST TECHNIQUE: Multidetector CT imaging of the chest, abdomen and pelvis was performed following the standard protocol during bolus administration of intravenous contrast. CONTRAST:  171mL OMNIPAQUE IOHEXOL 300 MG/ML  SOLN COMPARISON:  CT of the abdomen and pelvis without contrast on 09/01/2005 FINDINGS: CT CHEST FINDINGS Cardiovascular: The aortic root measures approximately 4.4 cm. There is mild dilatation of the ascending thoracic aorta measuring 4.3 cm. The aortic arch is also mildly dilated at 3.8 cm and the descending thoracic aorta measures 3 cm. No evidence of aortic dissection. Proximal great vessels are normally patent. Right-sided heart chambers are enlarged. There is prominent reflux of contrast into the intrahepatic IVC and diffusely throughout the hepatic veins. Findings are consistent with right heart failure and elevated right heart pressures. No pericardial effusion. Scattered calcified plaque is noted in the coronary arteries. Central pulmonary arteries are normal in caliber. Mediastinum/Nodes: No enlarged mediastinal, hilar, or axillary lymph nodes. Thyroid gland, trachea, and esophagus demonstrate no significant findings. Lungs/Pleura: Multiple calcified pleural plaques are present bilaterally consistent with asbestos exposure. There is significant emphysematous lung disease and cystic disease at the bases. Upper lung zones bilaterally as well as the superior segment of the left lower lobe demonstrate mild patchy areas of ground-glass opacity. This is felt to most likely be on the basis of mild pulmonary edema. No focal pulmonary masses. No  pneumothorax. No pleural fluid identified. Musculoskeletal: There are some focal dense sclerotic areas in the ribs including the right anterior third, right fourth, left first, left third and left fifth ribs. No other bony lesions identified in the chest. Vertebral bodies show no evidence of fracture. CT ABDOMEN PELVIS FINDINGS Hepatobiliary: No focal liver abnormality is seen. No gallstones, gallbladder wall thickening, or biliary dilatation. Pancreas: There is diffuse dilatation of the pancreatic duct up to approximately 5 mm. The duct appears smooth and regular with no side branch dilatation. No evidence of visible mass, significant pancreatic atrophy or ductal calculi. No evidence of overt pancreatitis or surrounding fluid collections. Spleen: Normal in size without focal abnormality. Adrenals/Urinary Tract: Status post prior bilateral nephrectomy. A left pelvic transplant kidney is present. This demonstrates no hydronephrosis. Delayed imaging shows excretion of contrast by the transplant kidney and flow of contrast into the transplant ureter and bladder. No adrenal masses. Stomach/Bowel: Bowel shows no evidence of obstruction. There may be some mild wall thickening involving the distal stomach and/or proximal duodenum. No evidence of free air. Diffuse diverticulosis present of the sigmoid colon without evidence of diverticulitis. Vascular/Lymphatic: Atherosclerosis of the distal aorta and iliac arteries without evidence of aneurysmal disease. Reproductive: Mild prostate enlargement. Other: No abdominal wall hernia or abnormality. No abdominopelvic ascites. Musculoskeletal: Area of sclerosis in the right superior pubic bone. Mild loss of height of the L3 and L4 vertebral bodies with superior endplate compression. No other bony lesions identified. IMPRESSION: 1. Mild aneurysmal disease of the ascending thoracic aorta measuring 4.3 cm in greatest diameter. No dissection. Recommend annual imaging followup by CTA or  MRA. This recommendation follows 2010 ACCF/AHA/AATS/ACR/ASA/SCA/SCAI/SIR/STS/SVM Guidelines for the Diagnosis and Management of Patients with Thoracic Aortic Disease. Circulation. 2010; 121: J478-G956 2. Evidence of significant right heart failure. Probable early alveolar edema in the upper lung zones bilaterally. 3. Sclerotic foci in several bilateral ribs as well as the right superior pubic bone. These are fairly well-circumscribed and  very dense. These may all represent benign bone islands. Sclerotic bony metastatic disease cannot be entirely excluded and correlation with PSA is recommended. 4. Evidence of prior asbestos exposure with multiple bilateral calcified pleural plaques. Associated significant chronic lung disease with advanced cystic changes at the lung bases bilaterally. 5. Unexplained diffuse dilatation of the pancreatic duct without obvious focal mass. This may be on the basis of prior pancreatitis. 6. 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 7. Possible mild wall thickening involving the distal stomach and/or proximal duodenum. This may be on the basis of peptic disease. Electronically Signed   By: Aletta Edouard M.D.   On: 03/26/2018 21:02   Ct Abdomen Pelvis W Contrast  Result Date: 03/26/2018 CLINICAL DATA:  Failure to thrive, decreased appetite, weight loss, fatigue. EXAM: CT CHEST, ABDOMEN, AND PELVIS WITH CONTRAST TECHNIQUE: Multidetector CT imaging of the chest, abdomen and pelvis was performed following the standard protocol during bolus administration of intravenous contrast. CONTRAST:  175mL OMNIPAQUE IOHEXOL 300 MG/ML  SOLN COMPARISON:  CT of the abdomen and pelvis without contrast on 09/01/2005 FINDINGS: CT CHEST FINDINGS Cardiovascular: The aortic root measures approximately 4.4 cm. There is mild dilatation of the ascending thoracic aorta measuring 4.3 cm. The aortic arch is also mildly dilated at 3.8 cm and the descending thoracic  aorta measures 3 cm. No evidence of aortic dissection. Proximal great vessels are normally patent. Right-sided heart chambers are enlarged. There is prominent reflux of contrast into the intrahepatic IVC and diffusely throughout the hepatic veins. Findings are consistent with right heart failure and elevated right heart pressures. No pericardial effusion. Scattered calcified plaque is noted in the coronary arteries. Central pulmonary arteries are normal in caliber. Mediastinum/Nodes: No enlarged mediastinal, hilar, or axillary lymph nodes. Thyroid gland, trachea, and esophagus demonstrate no significant findings. Lungs/Pleura: Multiple calcified pleural plaques are present bilaterally consistent with asbestos exposure. There is significant emphysematous lung disease and cystic disease at the bases. Upper lung zones bilaterally as well as the superior segment of the left lower lobe demonstrate mild patchy areas of ground-glass opacity. This is felt to most likely be on the basis of mild pulmonary edema. No focal pulmonary masses. No pneumothorax. No pleural fluid identified. Musculoskeletal: There are some focal dense sclerotic areas in the ribs including the right anterior third, right fourth, left first, left third and left fifth ribs. No other bony lesions identified in the chest. Vertebral bodies show no evidence of fracture. CT ABDOMEN PELVIS FINDINGS Hepatobiliary: No focal liver abnormality is seen. No gallstones, gallbladder wall thickening, or biliary dilatation. Pancreas: There is diffuse dilatation of the pancreatic duct up to approximately 5 mm. The duct appears smooth and regular with no side branch dilatation. No evidence of visible mass, significant pancreatic atrophy or ductal calculi. No evidence of overt pancreatitis or surrounding fluid collections. Spleen: Normal in size without focal abnormality. Adrenals/Urinary Tract: Status post prior bilateral nephrectomy. A left pelvic transplant kidney is  present. This demonstrates no hydronephrosis. Delayed imaging shows excretion of contrast by the transplant kidney and flow of contrast into the transplant ureter and bladder. No adrenal masses. Stomach/Bowel: Bowel shows no evidence of obstruction. There may be some mild wall thickening involving the distal stomach and/or proximal duodenum. No evidence of free air. Diffuse diverticulosis present of the sigmoid colon without evidence of diverticulitis. Vascular/Lymphatic: Atherosclerosis of the distal aorta and iliac arteries without evidence of aneurysmal disease. Reproductive: Mild prostate enlargement. Other: No abdominal wall hernia or abnormality.  No abdominopelvic ascites. Musculoskeletal: Area of sclerosis in the right superior pubic bone. Mild loss of height of the L3 and L4 vertebral bodies with superior endplate compression. No other bony lesions identified. IMPRESSION: 1. Mild aneurysmal disease of the ascending thoracic aorta measuring 4.3 cm in greatest diameter. No dissection. Recommend annual imaging followup by CTA or MRA. This recommendation follows 2010 ACCF/AHA/AATS/ACR/ASA/SCA/SCAI/SIR/STS/SVM Guidelines for the Diagnosis and Management of Patients with Thoracic Aortic Disease. Circulation. 2010; 121: B510-C585 2. Evidence of significant right heart failure. Probable early alveolar edema in the upper lung zones bilaterally. 3. Sclerotic foci in several bilateral ribs as well as the right superior pubic bone. These are fairly well-circumscribed and very dense. These may all represent benign bone islands. Sclerotic bony metastatic disease cannot be entirely excluded and correlation with PSA is recommended. 4. Evidence of prior asbestos exposure with multiple bilateral calcified pleural plaques. Associated significant chronic lung disease with advanced cystic changes at the lung bases bilaterally. 5. Unexplained diffuse dilatation of the pancreatic duct without obvious focal mass. This may be on the  basis of prior pancreatitis. 6. 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 7. Possible mild wall thickening involving the distal stomach and/or proximal duodenum. This may be on the basis of peptic disease. Electronically Signed   By: Aletta Edouard M.D.   On: 03/26/2018 21:02   Dg Esophagus  Result Date: 03/27/2018 CLINICAL DATA:  Difficulty swallowing, dysphagia EXAM: ESOPHOGRAM/BARIUM SWALLOW TECHNIQUE: Single contrast examination was performed using  thin barium. FLUOROSCOPY TIME:  Fluoroscopy Time:  3 minutes 24 seconds Radiation Exposure Index (if provided by the fluoroscopic device): Number of Acquired Spot Images: 0 COMPARISON:  CT 03/26/2018 FINDINGS: Fluoroscopic evaluation of swallowing demonstrates disruption of primary esophageal peristaltic waves. There is no fixed stricture or fold thickening. Small hiatal hernia noted. Just above the hiatal hernia, there is a moderate-sized distal esophageal diverticulum. No reflux with the water siphon maneuver. The patient swallowed a 13 mm barium tablet which sticks in the distal esophagus despite the lack of visible fixed stricture in the area. IMPRESSION: Esophageal dysmotility. Small hiatal hernia. Moderate-sized distal esophageal diverticulum. The 13 mm barium tablet sticks in the distal esophagus above the diverticulum despite lack of visible fixed stricture. Electronically Signed   By: Rolm Baptise M.D.   On: 03/27/2018 13:44   Dg Chest Port 1 View  Result Date: 03/27/2018 CLINICAL DATA:  84 year old male with sepsis. EXAM: PORTABLE CHEST 1 VIEW COMPARISON:  Chest CT dated 03/26/2018 FINDINGS: There is emphysematous changes of the lungs with interstitial coarsening. Small scattered hazy areas primarily in the left lung may be chronic although developing infiltrate is not excluded. Clinical correlation is recommended. There is no focal consolidation, pleural effusion, or pneumothorax. Small calcified  pleural plaques noted. Mild cardiomegaly. The thoracic aorta is tortuous with atherosclerotic calcification. There is degenerative changes of the spine. No acute osseous pathology. IMPRESSION: 1. Emphysema.  No focal consolidation or pneumothorax. 2. Areas of hazy density may be chronic or represent developing infiltrate. Clinical correlation is recommended. Electronically Signed   By: Anner Crete M.D.   On: 03/27/2018 00:11    Assessment and Plan:   1. New onset atrial fibrillation with RVR Unknown duration. Fatigued for past 4 weeks worsen in few days. Concern for underlying malignancy or infectious process. Elevated sed rate and CRP. Normal TSH.  Pending echo.  Rate improved to 70-60s on IV cardizem at rate of 7.53ml/hour. Consider switch to Po. CHADSVASC score of  5 for age, HTN and TIA. anticoagulation per MD.   2. Acute on CKD III s/p left kidney transplant secondary to hypertension and FSGS  - currently on chronic immunosuppressant therapy - Followed by Dr. Jimmy Footman   3. Weight loss - 40lb weight loss in past 6 months. Noted sclerotic bone lesions.  - Per primary team   For questions or updates, please contact [place] Please consult [email or web address] for contact info under Cardiology/STEMI.   Jarrett Soho, [place]  03/27/2018 3:34 PM

## 2018-03-27 NOTE — Care Management Note (Signed)
Case Management Note  Patient Details  Name: [name] [name] MRN: [id number] Date of Birth: Nov 15, 1927  Subjective/Objective:  From home with daughter, has on bear hugger,  Presents with sirs, afib, pvc's, on cardizem drip, npo for upper endo at 1 pm today for dysphagia, renal transplant (immunospuressant therapy).                   Action/Plan: NCM will follow for dc needs.  Expected Discharge Date:  03/29/18               Expected Discharge Plan:  [place]  In-House Referral:     Discharge planning Services  CM Consult  Post Acute Care Choice:    Choice offered to:     DME Arranged:    DME Agency:     HH Arranged:    HH Agency:     Status of Service:  In process, will continue to follow  If discussed at Long Length of Stay Meetings, dates discussed:    Additional Comments:  Zenon Mayo, RN 03/27/2018, 12:44 PM

## 2018-03-27 NOTE — Progress Notes (Signed)
PROGRESS NOTE    [name] [name]  XUX:[id number] DOB: 20-Sep-1927 DOA: 03/26/2018 PCP: Mauricia Area, MD    Brief Narrative: [name] [name] is a 84 y.o. male with medical history significant of HTN, anemia of chronic disease, CKD s/p renal transplant follow by Dr. Jimmy Footman of [facility]; who presents with complaints of not feeling well.  History is somewhat difficult to obtain from patient at this time.  He complains of having chills, malaise, urinary frequency, left flank pain, insomnia, nausea, and poor appetite.  Family report that the patient has had a significant amount of weight loss estimated to be up to 40 pounds in the last 6 months.  He denies having any cough, shortness of breath, emesis, or abdominal pain.  ED Course: Upon admission to the emergency department patient presents with temperature of 94.6 F improved to 98.7 F after being placed to bear hugger found in A. fib with heart rate up to 112, respirations 12-24, blood pressure as low as 80/59 improved to 143/106 with IV fluids.  Labs revealed normal CBC, sodium 132, BUN 19, creatinine 1.52, troponin 0.08, and lactic acid trending up to 2.72.  Urinalysis was negative for any acute abnormalities.  Chest x-ray showed no clear infiltrate, and scattered calcified pleural plaques consistent with prior space cysts exposure.  Patient was ordered a total of 1500 mL of normal saline IV fluids, given 15 mg of diltiazem IV,  and then started on diltiazem drip.  TRH called to admit.    Assessment & Plan:   Active Problems:   SIRS (systemic inflammatory response syndrome) (HCC)   Bone lesion   AAA (abdominal aortic aneurysm) (HCC)   BPH (benign prostatic hyperplasia)   Weight loss   Hypotension   Unspecified atrial fibrillation (HCC)   1-A fib RVR;  Start Cardizem gtt.  Cardiology consulted.  Troponin mild elevated.  ECHO pending.   2-Acute on CKD III S/P Left kidney transplant secondary to HTN and FSG;  Renal function  improved with fluids.  Continue with immunosuppressive therapy.   3-Weight loss; Severe malnutrition in the context of chronic illness 40 pounds sclerotic bone lesion, await PSA level.  Report dysphagia; inability to chew food. Change diet to dysphagia. Check esophagogram.  CRP 2.9.  pro calcitonin 0.1, ESR 46.  Ensure.   4-Hypomagnesemia; replete IV.   5-Evidence of prior asbestos exposure with multiple bilateral calcified pleural plaques. Associated significant chronic lung disease with advanced cystic changes at the lung bases bilaterally. - he will need pulmonologist follow up.   Possible mild wall thickening involving the distal stomach and/or proximal duodenum;  Started Pepcid.   SIRS;  Patient presents hypothermic, hypotensive, tachycardic, and tachypneic.  Patient's initial lactic acid was elevated to 2.72.  Urinalysis did not show any signs of infection and patient reported no respiratory symptoms. -continue with IV antibiotics for now.  -Follow blood cultures. No growth in 24 hours.  -UA negative,    DVT prophylaxis: lovenox Code Status:  Full code.  Family Communication: Son at bedside.  Disposition Plan: home when stable.    Consultants:   Cardiology    Procedures:      Antimicrobials:  Zosyn 7-16 Vancomycin 7-16  Subjective: Report problem chewing food.  Denies chest pain. Report problems with swallowing food at times.   Objective: Vitals:   03/27/18 0500 03/27/18 0600 03/27/18 0739 03/27/18 0741  BP: (!) 133/97 115/90  (!) 128/91  Pulse:    98  Resp:   14 14  Temp:  97.7 F (36.5 C) 97.7 F (36.5 C)  TempSrc:   Oral Oral  SpO2: 100% 100%  100%  Weight:      Height:        Intake/Output Summary (Last 24 hours) at 03/27/2018 0751 Last data filed at 03/27/2018 0651 Gross per 24 hour  Intake 2574.93 ml  Output 1070 ml  Net 1504.93 ml   Filed Weights   03/27/18 0100  Weight: 60.4 kg (133 lb 2.5 oz)    Examination:  General exam:  Appears calm and comfortable  Respiratory system: Clear to auscultation. Respiratory effort normal. Cardiovascular system: S1 & S2 heard, RRR. No JVD, murmurs, rubs, gallops or clicks. No pedal edema. Gastrointestinal system: Abdomen is nondistended, soft and nontender. No organomegaly or masses felt. Normal bowel sounds heard. Central nervous system: Alert and oriented. No focal neurological deficits. Extremities: Symmetric 5 x 5 power. Skin: No rashes, lesions or ulcers    Data Reviewed: I have personally reviewed following labs and imaging studies  CBC: Recent Labs  Lab 03/26/18 1616 03/27/18 0525  WBC 4.3 5.2  NEUTROABS 2.6  --   HGB 13.3 11.7*  HCT 42.4 36.2*  MCV 100.2* 97.1  PLT 195 846   Basic Metabolic Panel: Recent Labs  Lab 03/26/18 1616 03/26/18 2344 03/27/18 0525  NA 132*  --  134*  K 4.1  --  4.0  CL 96*  --  101  CO2 27  --  25  GLUCOSE 90  --  72  BUN 19  --  19  CREATININE 1.52*  --  1.41*  CALCIUM 8.8*  --  8.5*  MG  --  1.6*  --    GFR: Estimated Creatinine Clearance: 31.5 mL/min (A) (by C-G formula based on SCr of 1.41 mg/dL (H)). Liver Function Tests: Recent Labs  Lab 03/26/18 1616  AST 24  ALT 11  ALKPHOS 71  BILITOT 0.7  PROT 7.6  ALBUMIN 2.9*   Recent Labs  Lab 03/26/18 1753  LIPASE 35   No results for input(s): AMMONIA in the last 168 hours. Coagulation Profile: Recent Labs  Lab 03/26/18 1616  INR 1.16   Cardiac Enzymes: Recent Labs  Lab 03/26/18 2344 03/27/18 0525  TROPONINI 0.08* 0.09*   BNP (last 3 results) No results for input(s): PROBNP in the last 8760 hours. HbA1C: No results for input(s): HGBA1C in the last 72 hours. CBG: No results for input(s): GLUCAP in the last 168 hours. Lipid Profile: No results for input(s): CHOL, HDL, LDLCALC, TRIG, CHOLHDL, LDLDIRECT in the last 72 hours. Thyroid Function Tests: Recent Labs    03/26/18 2344  TSH 1.972   Anemia Panel: No results for input(s): VITAMINB12,  FOLATE, FERRITIN, TIBC, IRON, RETICCTPCT in the last 72 hours. Sepsis Labs: Recent Labs  Lab 03/26/18 1638 03/26/18 1809 03/26/18 2344  PROCALCITON  --   --  <0.10  LATICACIDVEN 2.49* 2.72* 1.4    Recent Results (from the past 240 hour(s))  MRSA PCR Screening     Status: None   Collection Time: 03/27/18 12:59 AM  Result Value Ref Range Status   MRSA by PCR NEGATIVE NEGATIVE Final    Comment:        The GeneXpert MRSA Assay (FDA approved for NASAL specimens only), is one component of a comprehensive MRSA colonization surveillance program. It is not intended to diagnose MRSA infection nor to guide or monitor treatment for MRSA infections. Performed at [facility], [place] [address] [place], [place] [address]  Radiology Studies: Dg Chest 2 View  Result Date: 03/26/2018 CLINICAL DATA:  Hypotension. EXAM: CHEST - 2 VIEW COMPARISON:  CT abdomen and pelvis dated August 30, 2005. FINDINGS: The heart size and mediastinal contours are within normal limits. Normal pulmonary vascularity. Stable cystic changes at the left greater than right lung base. No focal consolidation, pleural effusion, or pneumothorax. Scattered calcified pleural plaques. No acute osseous abnormality. IMPRESSION: 1.  No active cardiopulmonary disease. 2. Scattered calcified pleural plaques, consistent with prior space cyst exposure. Electronically Signed   By: Titus Dubin M.D.   On: 03/26/2018 17:00   Ct Chest W Contrast  Result Date: 03/26/2018 CLINICAL DATA:  Failure to thrive, decreased appetite, weight loss, fatigue. EXAM: CT CHEST, ABDOMEN, AND PELVIS WITH CONTRAST TECHNIQUE: Multidetector CT imaging of the chest, abdomen and pelvis was performed following the standard protocol during bolus administration of intravenous contrast. CONTRAST:  123m OMNIPAQUE IOHEXOL 300 MG/ML  SOLN COMPARISON:  CT of the abdomen and pelvis without contrast on 09/01/2005 FINDINGS: CT CHEST FINDINGS  Cardiovascular: The aortic root measures approximately 4.4 cm. There is mild dilatation of the ascending thoracic aorta measuring 4.3 cm. The aortic arch is also mildly dilated at 3.8 cm and the descending thoracic aorta measures 3 cm. No evidence of aortic dissection. Proximal great vessels are normally patent. Right-sided heart chambers are enlarged. There is prominent reflux of contrast into the intrahepatic IVC and diffusely throughout the hepatic veins. Findings are consistent with right heart failure and elevated right heart pressures. No pericardial effusion. Scattered calcified plaque is noted in the coronary arteries. Central pulmonary arteries are normal in caliber. Mediastinum/Nodes: No enlarged mediastinal, hilar, or axillary lymph nodes. Thyroid gland, trachea, and esophagus demonstrate no significant findings. Lungs/Pleura: Multiple calcified pleural plaques are present bilaterally consistent with asbestos exposure. There is significant emphysematous lung disease and cystic disease at the bases. Upper lung zones bilaterally as well as the superior segment of the left lower lobe demonstrate mild patchy areas of ground-glass opacity. This is felt to most likely be on the basis of mild pulmonary edema. No focal pulmonary masses. No pneumothorax. No pleural fluid identified. Musculoskeletal: There are some focal dense sclerotic areas in the ribs including the right anterior third, right fourth, left first, left third and left fifth ribs. No other bony lesions identified in the chest. Vertebral bodies show no evidence of fracture. CT ABDOMEN PELVIS FINDINGS Hepatobiliary: No focal liver abnormality is seen. No gallstones, gallbladder wall thickening, or biliary dilatation. Pancreas: There is diffuse dilatation of the pancreatic duct up to approximately 5 mm. The duct appears smooth and regular with no side branch dilatation. No evidence of visible mass, significant pancreatic atrophy or ductal calculi. No  evidence of overt pancreatitis or surrounding fluid collections. Spleen: Normal in size without focal abnormality. Adrenals/Urinary Tract: Status post prior bilateral nephrectomy. A left pelvic transplant kidney is present. This demonstrates no hydronephrosis. Delayed imaging shows excretion of contrast by the transplant kidney and flow of contrast into the transplant ureter and bladder. No adrenal masses. Stomach/Bowel: Bowel shows no evidence of obstruction. There may be some mild wall thickening involving the distal stomach and/or proximal duodenum. No evidence of free air. Diffuse diverticulosis present of the sigmoid colon without evidence of diverticulitis. Vascular/Lymphatic: Atherosclerosis of the distal aorta and iliac arteries without evidence of aneurysmal disease. Reproductive: Mild prostate enlargement. Other: No abdominal wall hernia or abnormality. No abdominopelvic ascites. Musculoskeletal: Area of sclerosis in the right superior pubic bone. Mild loss of height  of the L3 and L4 vertebral bodies with superior endplate compression. No other bony lesions identified. IMPRESSION: 1. Mild aneurysmal disease of the ascending thoracic aorta measuring 4.3 cm in greatest diameter. No dissection. Recommend annual imaging followup by CTA or MRA. This recommendation follows 2010 ACCF/AHA/AATS/ACR/ASA/SCA/SCAI/SIR/STS/SVM Guidelines for the Diagnosis and Management of Patients with Thoracic Aortic Disease. Circulation. 2010; 121: N989-Q119 2. Evidence of significant right heart failure. Probable early alveolar edema in the upper lung zones bilaterally. 3. Sclerotic foci in several bilateral ribs as well as the right superior pubic bone. These are fairly well-circumscribed and very dense. These may all represent benign bone islands. Sclerotic bony metastatic disease cannot be entirely excluded and correlation with PSA is recommended. 4. Evidence of prior asbestos exposure with multiple bilateral calcified pleural  plaques. Associated significant chronic lung disease with advanced cystic changes at the lung bases bilaterally. 5. Unexplained diffuse dilatation of the pancreatic duct without obvious focal mass. This may be on the basis of prior pancreatitis. 6. 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 7. Possible mild wall thickening involving the distal stomach and/or proximal duodenum. This may be on the basis of peptic disease. Electronically Signed   By: Aletta Edouard M.D.   On: 03/26/2018 21:02   Ct Abdomen Pelvis W Contrast  Result Date: 03/26/2018 CLINICAL DATA:  Failure to thrive, decreased appetite, weight loss, fatigue. EXAM: CT CHEST, ABDOMEN, AND PELVIS WITH CONTRAST TECHNIQUE: Multidetector CT imaging of the chest, abdomen and pelvis was performed following the standard protocol during bolus administration of intravenous contrast. CONTRAST:  164m OMNIPAQUE IOHEXOL 300 MG/ML  SOLN COMPARISON:  CT of the abdomen and pelvis without contrast on 09/01/2005 FINDINGS: CT CHEST FINDINGS Cardiovascular: The aortic root measures approximately 4.4 cm. There is mild dilatation of the ascending thoracic aorta measuring 4.3 cm. The aortic arch is also mildly dilated at 3.8 cm and the descending thoracic aorta measures 3 cm. No evidence of aortic dissection. Proximal great vessels are normally patent. Right-sided heart chambers are enlarged. There is prominent reflux of contrast into the intrahepatic IVC and diffusely throughout the hepatic veins. Findings are consistent with right heart failure and elevated right heart pressures. No pericardial effusion. Scattered calcified plaque is noted in the coronary arteries. Central pulmonary arteries are normal in caliber. Mediastinum/Nodes: No enlarged mediastinal, hilar, or axillary lymph nodes. Thyroid gland, trachea, and esophagus demonstrate no significant findings. Lungs/Pleura: Multiple calcified pleural plaques are present  bilaterally consistent with asbestos exposure. There is significant emphysematous lung disease and cystic disease at the bases. Upper lung zones bilaterally as well as the superior segment of the left lower lobe demonstrate mild patchy areas of ground-glass opacity. This is felt to most likely be on the basis of mild pulmonary edema. No focal pulmonary masses. No pneumothorax. No pleural fluid identified. Musculoskeletal: There are some focal dense sclerotic areas in the ribs including the right anterior third, right fourth, left first, left third and left fifth ribs. No other bony lesions identified in the chest. Vertebral bodies show no evidence of fracture. CT ABDOMEN PELVIS FINDINGS Hepatobiliary: No focal liver abnormality is seen. No gallstones, gallbladder wall thickening, or biliary dilatation. Pancreas: There is diffuse dilatation of the pancreatic duct up to approximately 5 mm. The duct appears smooth and regular with no side branch dilatation. No evidence of visible mass, significant pancreatic atrophy or ductal calculi. No evidence of overt pancreatitis or surrounding fluid collections. Spleen: Normal in size without focal abnormality. Adrenals/Urinary Tract: Status  post prior bilateral nephrectomy. A left pelvic transplant kidney is present. This demonstrates no hydronephrosis. Delayed imaging shows excretion of contrast by the transplant kidney and flow of contrast into the transplant ureter and bladder. No adrenal masses. Stomach/Bowel: Bowel shows no evidence of obstruction. There may be some mild wall thickening involving the distal stomach and/or proximal duodenum. No evidence of free air. Diffuse diverticulosis present of the sigmoid colon without evidence of diverticulitis. Vascular/Lymphatic: Atherosclerosis of the distal aorta and iliac arteries without evidence of aneurysmal disease. Reproductive: Mild prostate enlargement. Other: No abdominal wall hernia or abnormality. No abdominopelvic  ascites. Musculoskeletal: Area of sclerosis in the right superior pubic bone. Mild loss of height of the L3 and L4 vertebral bodies with superior endplate compression. No other bony lesions identified. IMPRESSION: 1. Mild aneurysmal disease of the ascending thoracic aorta measuring 4.3 cm in greatest diameter. No dissection. Recommend annual imaging followup by CTA or MRA. This recommendation follows 2010 ACCF/AHA/AATS/ACR/ASA/SCA/SCAI/SIR/STS/SVM Guidelines for the Diagnosis and Management of Patients with Thoracic Aortic Disease. Circulation. 2010; 121: E332-R518 2. Evidence of significant right heart failure. Probable early alveolar edema in the upper lung zones bilaterally. 3. Sclerotic foci in several bilateral ribs as well as the right superior pubic bone. These are fairly well-circumscribed and very dense. These may all represent benign bone islands. Sclerotic bony metastatic disease cannot be entirely excluded and correlation with PSA is recommended. 4. Evidence of prior asbestos exposure with multiple bilateral calcified pleural plaques. Associated significant chronic lung disease with advanced cystic changes at the lung bases bilaterally. 5. Unexplained diffuse dilatation of the pancreatic duct without obvious focal mass. This may be on the basis of prior pancreatitis. 6. 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 7. Possible mild wall thickening involving the distal stomach and/or proximal duodenum. This may be on the basis of peptic disease. Electronically Signed   By: Aletta Edouard M.D.   On: 03/26/2018 21:02   Dg Chest Port 1 View  Result Date: 03/27/2018 CLINICAL DATA:  84 year old male with sepsis. EXAM: PORTABLE CHEST 1 VIEW COMPARISON:  Chest CT dated 03/26/2018 FINDINGS: There is emphysematous changes of the lungs with interstitial coarsening. Small scattered hazy areas primarily in the left lung may be chronic although developing infiltrate is not  excluded. Clinical correlation is recommended. There is no focal consolidation, pleural effusion, or pneumothorax. Small calcified pleural plaques noted. Mild cardiomegaly. The thoracic aorta is tortuous with atherosclerotic calcification. There is degenerative changes of the spine. No acute osseous pathology. IMPRESSION: 1. Emphysema.  No focal consolidation or pneumothorax. 2. Areas of hazy density may be chronic or represent developing infiltrate. Clinical correlation is recommended. Electronically Signed   By: Anner Crete M.D.   On: 03/27/2018 00:11        Scheduled Meds: . aspirin  81 mg Oral Daily  . cycloSPORINE modified  100 mg Oral BID  . enoxaparin (LOVENOX) injection  40 mg Subcutaneous Q24H  . mycophenolate  180 mg Oral BID  . predniSONE  5 mg Oral Q breakfast  . sodium chloride flush  3 mL Intravenous Q12H   Continuous Infusions: . sodium chloride 10 mL/hr at 03/27/18 0004  . sodium chloride 10 mL/hr at 03/27/18 0004  . piperacillin-tazobactam (ZOSYN)  IV 3.375 g (03/27/18 0520)  . vancomycin       LOS: 1 day    Time spent: 35 minutes.     Elmarie Shiley, MD [facility] Pager [phone]  If 7PM-7AM, please  contact night-coverage [email or web address] Password [facility] 03/27/2018, 7:51 AM

## 2018-03-27 NOTE — Progress Notes (Signed)
  Echocardiogram 2D Echocardiogram has been performed.  [name] [name] 03/27/2018, 4:51 PM

## 2018-03-27 NOTE — Progress Notes (Signed)
RN monitored pt's heart rate this morning as it was reaching into the 130s. At [id number], tele center informed RN that pt's HR staying in the 150's. RN assessed pt in person. He denied any SHOB and/or chest pain. RN took BP which was 120s/ 110s. RN notified Regaldo MD. She assessed pt in person. MD ordered 500 ml NS bolus, cardizem drip, magnesium lab draw, and upper endo (d/t dysphagia). RN informed pt and son of plan and initiated new regimen. RN will continue to monitor pt.

## 2018-03-27 NOTE — Progress Notes (Signed)
CRITICAL VALUE ALERT  Critical Value:  Troponin 0.08 and Mg 1.6  Date & Time Notied:  03/27/18 @ 0150  Provider Notified: Lamar Blinks NP  Orders Received/Actions taken: Will continue to monitor, Asymptomatic at this time

## 2018-03-27 NOTE — Progress Notes (Signed)
Pharmacy Antibiotic Note  [name] [name] is a 84 y.o. male admitted on 03/26/2018 with sepsis.  Pharmacy has been consulted for Vancomycin/Zosyn dosing. WBC WNL. Noted renal dysfunction-renal transplant pt.   Plan: Vancomycin 750 mg IV q24h  Zosyn 3.375G IV q8h to be infused over 4 hours Trend WBC, temp, renal function  F/U infectious work-up Drug levels as indicated   Height: 6\' 3"  (190.5 cm) Weight: 133 lb 2.5 oz (60.4 kg) IBW/kg (Calculated) : 84.5  Temp (24hrs), Avg:97 F (36.1 C), Min:94.6 F (34.8 C), Max:98.7 F (37.1 C)  Recent Labs  Lab 03/26/18 1616 03/26/18 1638 03/26/18 1809 03/26/18 2344  WBC 4.3  --   --   --   CREATININE 1.52*  --   --   --   LATICACIDVEN  --  2.49* 2.72* 1.4    Estimated Creatinine Clearance: 29.3 mL/min (A) (by C-G formula based on SCr of 1.52 mg/dL (H)).    Allergies  Allergen Reactions  . Ciprofloxacin Rash  . Clindamycin/Lincomycin Rash  . Levaquin [Levofloxacin] Rash  . Septra [Sulfamethoxazole-Trimethoprim] Rash     Narda Bonds 03/27/2018 2:33 AM

## 2018-03-27 NOTE — H&P (Addendum)
History and Physical    [name] [name] OEU:[id number] DOB: 07/20/1928 DOA: 03/26/2018  Referring MD/NP/PA: Rodell Perna, PA-C PCP: Mauricia Area, MD  Patient coming from: Sent from urgent care  Chief Complaint: Not feeling well  I have personally briefly reviewed patient's old medical records in [place]   HPI: [name] [name] is a 84 y.o. male with medical history significant of HTN, anemia of chronic disease, CKD s/p renal transplant follow by Dr. Jimmy Footman of [facility]; who presents with complaints of not feeling well.  History is somewhat difficult to obtain from patient at this time.  He complains of having chills, malaise, urinary frequency, left flank pain, insomnia, nausea, and poor appetite.  Family report that the patient has had a significant amount of weight loss estimated to be up to 40 pounds in the last 6 months.  He denies having any cough, shortness of breath, emesis, or abdominal pain.  ED Course: Upon admission to the emergency department patient presents with temperature of 94.6 F improved to 98.7 F after being placed to bear hugger found in A. fib with heart rate up to 112, respirations 12-24, blood pressure as low as 80/59 improved to 143/106 with IV fluids.  Labs revealed normal CBC, sodium 132, BUN 19, creatinine 1.52, troponin 0.08, and lactic acid trending up to 2.72.  Urinalysis was negative for any acute abnormalities.  Chest x-ray showed no clear infiltrate, and scattered calcified pleural plaques consistent with prior space cysts exposure.  Patient was ordered a total of 1500 mL of normal saline IV fluids, given 15 mg of diltiazem IV,  and then started on diltiazem drip.  TRH called to admit.  Review of Systems  Constitutional: Positive for chills, malaise/fatigue and weight loss.  HENT: Negative for congestion and nosebleeds.   Eyes: Negative for double vision and photophobia.  Respiratory: Negative for cough and shortness of breath.   Cardiovascular:  Negative for chest pain and leg swelling.  Gastrointestinal: Positive for nausea. Negative for abdominal pain, blood in stool and vomiting.  Genitourinary: Positive for flank pain and frequency.  Musculoskeletal: Negative for falls.  Skin: Negative for itching.  Neurological: Positive for weakness. Negative for focal weakness and loss of consciousness.  Endo/Heme/Allergies: Does not bruise/bleed easily.  Psychiatric/Behavioral: Negative for substance abuse. The patient has insomnia.       Past Medical History:  Diagnosis Date  . Anemia in CKD (chronic kidney disease)   . Arthritis   . Bilateral hydrocele   . CKD (chronic kidney disease), stage II    nephrologist-  deterding  . Diverticulosis   . Dysuria   . H/O kidney transplant    right 1980/  left 1994 with right transplanted kidney removal  . Hematuria   . History of adenomatous polyp of colon    VILLOUS ADENOMA POLYP  . History of asbestos exposure   . History of condyloma acuminatum    genital  . 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 History of renal cell carcinoma    S/P  BILATERAL NEPHRECTOMY AND RENAL TRANSPLANTS  . History of small bowel obstruction    12/ 2006  due to adhesions -- resolved without surgerical intervention  . History of TIA (transient ischemic attack)   . Hyperlipidemia   . Hypertension   . Proteinuria   . Secondary hyperparathyroidism, renal ([place])   . Wears glasses     Past Surgical History:  Procedure Laterality Date  .  COLONOSCOPY  10/17/2012   Procedure: COLONOSCOPY;  Surgeon: Beryle Beams, MD;  Location: [facility];  Service: Endoscopy;  Laterality: N/A;  . CYSTOSCOPY N/A 07/13/2015   Procedure: CYSTOSCOPY;  Surgeon: Irine Seal, MD;  [facility];  Service: Urology;  Laterality: N/A;  . HEMICOLECTOMY Right 1995   adenoma polyp  . HYDROCELE EXCISION Bilateral 07/13/2015   Procedure: RIGHT HYDROCELECTOMY, ADULT DRAINAGE OF LEFT  HYDROCELE;  Surgeon: Irine Seal, MD;  Location: [facility];  Service: Urology;  Laterality: Bilateral;  . KIDNEY TRANSPLANT  right 1980; left 1994---  [facility]   right kidney removed 1989/  left kidney removed 1994     reports that he has never smoked. He has never used smokeless tobacco. He reports that he does not drink alcohol or use drugs.  Allergies  Allergen Reactions  . Ciprofloxacin Rash  . Clindamycin/Lincomycin Rash  . Levaquin [Levofloxacin] Rash  . Septra [Sulfamethoxazole-Trimethoprim] Rash    History reviewed. No pertinent family history.  Prior to Admission medications   Medication Sig Start Date End Date Taking? Authorizing Provider  aspirin 81 MG tablet Take 81 mg by mouth daily.    [provider]  atorvastatin (LIPITOR) 40 MG tablet Take 40 mg by mouth at bedtime.    [provider]  cephALEXin (KEFLEX) 500 MG capsule Take 1 capsule (500 mg total) by mouth 4 (four) times daily. 07/13/15   Irine Seal, MD  cycloSPORINE modified (NEORAL) 100 MG capsule Take 100 mg by mouth 2 (two) times daily. 100 mg in am/  25 mg in pm    [provider]  furosemide (LASIX) 20 MG tablet Take 40 mg by mouth as needed. As needed    [provider]  HYDROcodone-acetaminophen (NORCO) 5-325 MG tablet Take 1 tablet by mouth every 6 (six) hours as needed for moderate pain. 07/13/15   Irine Seal, MD  losartan (COZAAR) 100 MG tablet Take by mouth 2 (two) times daily. Take one tab in am and 1/2 tab at bedtime    [provider]  mycophenolate (MYFORTIC) 180 MG EC tablet Take 180 mg by mouth 2 (two) times daily.    [provider]  predniSONE (DELTASONE) 5 MG tablet Take 5 mg by mouth daily with breakfast.    [provider]    Physical Exam:  Constitutional: Cachectic appearing elderly male who appears ill Vitals:   03/27/18 0300 03/27/18 0400 03/27/18 0500 03/27/18 0600  BP: (!) 145/105 (!) 145/105 (!) 133/97  115/90  Pulse:  77    Resp:  18    Temp:  (!) 97.4 F (36.3 C)    TempSrc:  Oral    SpO2: 100% 100% 100% 100%  Weight:      Height:       Eyes: PERRL, lids and conjunctivae normal ENMT: Mucous membranes are moist. Posterior pharynx clear of any exudate or lesions. poor dentition.  Neck: normal, supple, no masses, no thyromegaly Respiratory: clear to auscultation bilaterally, no wheezing, no crackles. Normal respiratory effort. No accessory muscle use.  Cardiovascular: Irregularly irregular, no murmurs / rubs / gallops. No extremity edema. 2+ pedal pulses. No carotid bruits.  Abdomen: Left flank tenderness present, no masses palpated. No hepatosplenomegaly. Bowel sounds positive.  Musculoskeletal: no clubbing / cyanosis. No joint deformity upper and lower extremities. Good ROM, no contractures. Normal muscle tone.  Skin: no rashes, lesions, ulcers. No induration Neurologic: CN 2-12 grossly intact. Sensation intact, DTR normal. Strength 5/5 in all  4.  Psychiatric: Normal judgment and insight. Alert and oriented x 3. Normal mood.     Labs on Admission: I have personally reviewed following labs and imaging studies  CBC: Recent Labs  Lab 03/26/18 1616 03/27/18 0525  WBC 4.3 5.2  NEUTROABS 2.6  --   HGB 13.3 11.7*  HCT 42.4 36.2*  MCV 100.2* 97.1  PLT 195 854   Basic Metabolic Panel: Recent Labs  Lab 03/26/18 1616 03/26/18 2344 03/27/18 0525  NA 132*  --  134*  K 4.1  --  4.0  CL 96*  --  101  CO2 27  --  25  GLUCOSE 90  --  72  BUN 19  --  19  CREATININE 1.52*  --  1.41*  CALCIUM 8.8*  --  8.5*  MG  --  1.6*  --    GFR: Estimated Creatinine Clearance: 31.5 mL/min (A) (by C-G formula based on SCr of 1.41 mg/dL (H)). Liver Function Tests: Recent Labs  Lab 03/26/18 1616  AST 24  ALT 11  ALKPHOS 71  BILITOT 0.7  PROT 7.6  ALBUMIN 2.9*   Recent Labs  Lab 03/26/18 1753  LIPASE 35   No results for input(s): AMMONIA in the last 168 hours. Coagulation  Profile: Recent Labs  Lab 03/26/18 1616  INR 1.16   Cardiac Enzymes: Recent Labs  Lab 03/26/18 2344 03/27/18 0525  TROPONINI 0.08* 0.09*   BNP (last 3 results) No results for input(s): PROBNP in the last 8760 hours. HbA1C: No results for input(s): HGBA1C in the last 72 hours. CBG: No results for input(s): GLUCAP in the last 168 hours. Lipid Profile: No results for input(s): CHOL, HDL, LDLCALC, TRIG, CHOLHDL, LDLDIRECT in the last 72 hours. Thyroid Function Tests: Recent Labs    03/26/18 2344  TSH 1.972   Anemia Panel: No results for input(s): VITAMINB12, FOLATE, FERRITIN, TIBC, IRON, RETICCTPCT in the last 72 hours. Urine analysis:    Component Value Date/Time   COLORURINE YELLOW 03/26/2018 1940   APPEARANCEUR CLEAR 03/26/2018 1940   LABSPEC 1.010 03/26/2018 1940   PHURINE 5.0 03/26/2018 1940   GLUCOSEU NEGATIVE 03/26/2018 1940   HGBUR MODERATE (A) 03/26/2018 1940   BILIRUBINUR NEGATIVE 03/26/2018 [place] NEGATIVE 03/26/2018 1940   PROTEINUR 100 (A) 03/26/2018 1940   NITRITE NEGATIVE 03/26/2018 1940   LEUKOCYTESUR NEGATIVE 03/26/2018 1940   Sepsis Labs: Recent Results (from the past 240 hour(s))  MRSA PCR Screening     Status: None   Collection Time: 03/27/18 12:59 AM  Result Value Ref Range Status   MRSA by PCR NEGATIVE NEGATIVE Final    Comment:        The GeneXpert MRSA Assay (FDA approved for NASAL specimens only), is one component of a comprehensive MRSA colonization surveillance program. It is not intended to diagnose MRSA infection nor to guide or monitor treatment for MRSA infections. Performed at [facility], [place] [address] [place],  [address]      Radiological Exams on Admission: Dg Chest 2 View  Result Date: 03/26/2018 CLINICAL DATA:  Hypotension. EXAM: CHEST - 2 VIEW COMPARISON:  CT abdomen and pelvis dated August 30, 2005. FINDINGS: The heart size and mediastinal contours are within normal limits. Normal  pulmonary vascularity. Stable cystic changes at the left greater than right lung base. No focal consolidation, pleural effusion, or pneumothorax. Scattered calcified pleural plaques. No acute osseous abnormality. IMPRESSION: 1.  No active cardiopulmonary disease. 2. Scattered calcified pleural plaques, consistent with  prior space cyst exposure. Electronically Signed   By: Titus Dubin M.D.   On: 03/26/2018 17:00   Ct Chest W Contrast  Result Date: 03/26/2018 CLINICAL DATA:  Failure to thrive, decreased appetite, weight loss, fatigue. EXAM: CT CHEST, ABDOMEN, AND PELVIS WITH CONTRAST TECHNIQUE: Multidetector CT imaging of the chest, abdomen and pelvis was performed following the standard protocol during bolus administration of intravenous contrast. CONTRAST:  119m OMNIPAQUE IOHEXOL 300 MG/ML  SOLN COMPARISON:  CT of the abdomen and pelvis without contrast on 09/01/2005 FINDINGS: CT CHEST FINDINGS Cardiovascular: The aortic root measures approximately 4.4 cm. There is mild dilatation of the ascending thoracic aorta measuring 4.3 cm. The aortic arch is also mildly dilated at 3.8 cm and the descending thoracic aorta measures 3 cm. No evidence of aortic dissection. Proximal great vessels are normally patent. Right-sided heart chambers are enlarged. There is prominent reflux of contrast into the intrahepatic IVC and diffusely throughout the hepatic veins. Findings are consistent with right heart failure and elevated right heart pressures. No pericardial effusion. Scattered calcified plaque is noted in the coronary arteries. Central pulmonary arteries are normal in caliber. Mediastinum/Nodes: No enlarged mediastinal, hilar, or axillary lymph nodes. Thyroid gland, trachea, and esophagus demonstrate no significant findings. Lungs/Pleura: Multiple calcified pleural plaques are present bilaterally consistent with asbestos exposure. There is significant emphysematous lung disease and cystic disease at the bases. Upper  lung zones bilaterally as well as the superior segment of the left lower lobe demonstrate mild patchy areas of ground-glass opacity. This is felt to most likely be on the basis of mild pulmonary edema. No focal pulmonary masses. No pneumothorax. No pleural fluid identified. Musculoskeletal: There are some focal dense sclerotic areas in the ribs including the right anterior third, right fourth, left first, left third and left fifth ribs. No other bony lesions identified in the chest. Vertebral bodies show no evidence of fracture. CT ABDOMEN PELVIS FINDINGS Hepatobiliary: No focal liver abnormality is seen. No gallstones, gallbladder wall thickening, or biliary dilatation. Pancreas: There is diffuse dilatation of the pancreatic duct up to approximately 5 mm. The duct appears smooth and regular with no side branch dilatation. No evidence of visible mass, significant pancreatic atrophy or ductal calculi. No evidence of overt pancreatitis or surrounding fluid collections. Spleen: Normal in size without focal abnormality. Adrenals/Urinary Tract: Status post prior bilateral nephrectomy. A left pelvic transplant kidney is present. This demonstrates no hydronephrosis. Delayed imaging shows excretion of contrast by the transplant kidney and flow of contrast into the transplant ureter and bladder. No adrenal masses. Stomach/Bowel: Bowel shows no evidence of obstruction. There may be some mild wall thickening involving the distal stomach and/or proximal duodenum. No evidence of free air. Diffuse diverticulosis present of the sigmoid colon without evidence of diverticulitis. Vascular/Lymphatic: Atherosclerosis of the distal aorta and iliac arteries without evidence of aneurysmal disease. Reproductive: Mild prostate enlargement. Other: No abdominal wall hernia or abnormality. No abdominopelvic ascites. Musculoskeletal: Area of sclerosis in the right superior pubic bone. Mild loss of height of the L3 and L4 vertebral bodies with  superior endplate compression. No other bony lesions identified. IMPRESSION: 1. Mild aneurysmal disease of the ascending thoracic aorta measuring 4.3 cm in greatest diameter. No dissection. Recommend annual imaging followup by CTA or MRA. This recommendation follows 2010 ACCF/AHA/AATS/ACR/ASA/SCA/SCAI/SIR/STS/SVM Guidelines for the Diagnosis and Management of Patients with Thoracic Aortic Disease. Circulation. 2010; 121: eP295-J8842. Evidence of significant right heart failure. Probable early alveolar edema in the upper lung zones bilaterally. 3. Sclerotic foci  in several bilateral ribs as well as the right superior pubic bone. These are fairly well-circumscribed and very dense. These may all represent benign bone islands. Sclerotic bony metastatic disease cannot be entirely excluded and correlation with PSA is recommended. 4. Evidence of prior asbestos exposure with multiple bilateral calcified pleural plaques. Associated significant chronic lung disease with advanced cystic changes at the lung bases bilaterally. 5. Unexplained diffuse dilatation of the pancreatic duct without obvious focal mass. This may be on the basis of prior pancreatitis. 6. 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 7. Possible mild wall thickening involving the distal stomach and/or proximal duodenum. This may be on the basis of peptic disease. Electronically Signed   By: Aletta Edouard M.D.   On: 03/26/2018 21:02   Ct Abdomen Pelvis W Contrast  Result Date: 03/26/2018 CLINICAL DATA:  Failure to thrive, decreased appetite, weight loss, fatigue. EXAM: CT CHEST, ABDOMEN, AND PELVIS WITH CONTRAST TECHNIQUE: Multidetector CT imaging of the chest, abdomen and pelvis was performed following the standard protocol during bolus administration of intravenous contrast. CONTRAST:  160m OMNIPAQUE IOHEXOL 300 MG/ML  SOLN COMPARISON:  CT of the abdomen and pelvis without contrast on 09/01/2005 FINDINGS: CT  CHEST FINDINGS Cardiovascular: The aortic root measures approximately 4.4 cm. There is mild dilatation of the ascending thoracic aorta measuring 4.3 cm. The aortic arch is also mildly dilated at 3.8 cm and the descending thoracic aorta measures 3 cm. No evidence of aortic dissection. Proximal great vessels are normally patent. Right-sided heart chambers are enlarged. There is prominent reflux of contrast into the intrahepatic IVC and diffusely throughout the hepatic veins. Findings are consistent with right heart failure and elevated right heart pressures. No pericardial effusion. Scattered calcified plaque is noted in the coronary arteries. Central pulmonary arteries are normal in caliber. Mediastinum/Nodes: No enlarged mediastinal, hilar, or axillary lymph nodes. Thyroid gland, trachea, and esophagus demonstrate no significant findings. Lungs/Pleura: Multiple calcified pleural plaques are present bilaterally consistent with asbestos exposure. There is significant emphysematous lung disease and cystic disease at the bases. Upper lung zones bilaterally as well as the superior segment of the left lower lobe demonstrate mild patchy areas of ground-glass opacity. This is felt to most likely be on the basis of mild pulmonary edema. No focal pulmonary masses. No pneumothorax. No pleural fluid identified. Musculoskeletal: There are some focal dense sclerotic areas in the ribs including the right anterior third, right fourth, left first, left third and left fifth ribs. No other bony lesions identified in the chest. Vertebral bodies show no evidence of fracture. CT ABDOMEN PELVIS FINDINGS Hepatobiliary: No focal liver abnormality is seen. No gallstones, gallbladder wall thickening, or biliary dilatation. Pancreas: There is diffuse dilatation of the pancreatic duct up to approximately 5 mm. The duct appears smooth and regular with no side branch dilatation. No evidence of visible mass, significant pancreatic atrophy or  ductal calculi. No evidence of overt pancreatitis or surrounding fluid collections. Spleen: Normal in size without focal abnormality. Adrenals/Urinary Tract: Status post prior bilateral nephrectomy. A left pelvic transplant kidney is present. This demonstrates no hydronephrosis. Delayed imaging shows excretion of contrast by the transplant kidney and flow of contrast into the transplant ureter and bladder. No adrenal masses. Stomach/Bowel: Bowel shows no evidence of obstruction. There may be some mild wall thickening involving the distal stomach and/or proximal duodenum. No evidence of free air. Diffuse diverticulosis present of the sigmoid colon without evidence of diverticulitis. Vascular/Lymphatic: Atherosclerosis of the distal aorta and iliac  arteries without evidence of aneurysmal disease. Reproductive: Mild prostate enlargement. Other: No abdominal wall hernia or abnormality. No abdominopelvic ascites. Musculoskeletal: Area of sclerosis in the right superior pubic bone. Mild loss of height of the L3 and L4 vertebral bodies with superior endplate compression. No other bony lesions identified. IMPRESSION: 1. Mild aneurysmal disease of the ascending thoracic aorta measuring 4.3 cm in greatest diameter. No dissection. Recommend annual imaging followup by CTA or MRA. This recommendation follows 2010 ACCF/AHA/AATS/ACR/ASA/SCA/SCAI/SIR/STS/SVM Guidelines for the Diagnosis and Management of Patients with Thoracic Aortic Disease. Circulation. 2010; 121: M353-I144 2. Evidence of significant right heart failure. Probable early alveolar edema in the upper lung zones bilaterally. 3. Sclerotic foci in several bilateral ribs as well as the right superior pubic bone. These are fairly well-circumscribed and very dense. These may all represent benign bone islands. Sclerotic bony metastatic disease cannot be entirely excluded and correlation with PSA is recommended. 4. Evidence of prior asbestos exposure with multiple bilateral  calcified pleural plaques. Associated significant chronic lung disease with advanced cystic changes at the lung bases bilaterally. 5. Unexplained diffuse dilatation of the pancreatic duct without obvious focal mass. This may be on the basis of prior pancreatitis. 6. 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 7. Possible mild wall thickening involving the distal stomach and/or proximal duodenum. This may be on the basis of peptic disease. Electronically Signed   By: Aletta Edouard M.D.   On: 03/26/2018 21:02   Dg Chest Port 1 View  Result Date: 03/27/2018 CLINICAL DATA:  84 year old male with sepsis. EXAM: PORTABLE CHEST 1 VIEW COMPARISON:  Chest CT dated 03/26/2018 FINDINGS: There is emphysematous changes of the lungs with interstitial coarsening. Small scattered hazy areas primarily in the left lung may be chronic although developing infiltrate is not excluded. Clinical correlation is recommended. There is no focal consolidation, pleural effusion, or pneumothorax. Small calcified pleural plaques noted. Mild cardiomegaly. The thoracic aorta is tortuous with atherosclerotic calcification. There is degenerative changes of the spine. No acute osseous pathology. IMPRESSION: 1. Emphysema.  No focal consolidation or pneumothorax. 2. Areas of hazy density may be chronic or represent developing infiltrate. Clinical correlation is recommended. Electronically Signed   By: Anner Crete M.D.   On: 03/27/2018 00:11    EKG: Independently reviewed.  Atrial fibrillation at 122 bpm  Assessment/Plan SIRS/Sepsis, unknown source: Acute.  Patient presents hypothermic, hypotensive, tachycardic, and tachypneic.  Patient's initial lactic acid was elevated to 2.72.  Urinalysis did not show any signs of infection and patient reported no respiratory symptoms.  Given patient is immunocompromised on chronic immunosuppressive agents and is concerned for the possibility of underlying  infection. - Admit to stepdown  - Follow-up blood culture - Add on procalcitonin, CRP - Continue Bear hugger per protocol - Start empiric antibiotics of vancomycin and Zosyn -Trend lactic acid level  Atrial fibrillation: Acute.  Patient was found to be in A. fib with an rate of 122 bpm. CHA2DS2-VASc score = equals at least 4 given age, and prior history of TIA.  - Focus A. fib order set initiated - Trend cardiac troponin - Cardizem drip as tolerated - Check TSH and magnesium level - Goal potassium 4 and magnesium 2 - Discussed risks and benefits of anticoagulation  S/p renal transplant, acute kidney injury on chronic kidney disease: Patient status post left kidney transplant secondary to hypertension and FSGS currently on chronic immunosuppressant therapy.  Patient followed in the outpatient setting with Dr. Jimmy Footman of [facility]. -  Continue cyclosporine, prednisone, and Myfortic   Sclerotic bone lesion, weight loss: Acute.  Patient reported to have lost up to 40 pounds with bone lesions concerning for possible malignancy. - Check PSA  - Add- on ESR  BPH: Patient reports complaints of urinary frequency.  Foley catheter placed in ED.  - Consider starting patient on Flomax  Hypotension: Resolved.  Patient blood pressure was initially noted to be as low as 80/59. - Hold blood pressure medications - IV fluids as tolerated  AAA of the descending aorta: Noted to be 4.3 cm - Continue outpatient follow-up and monitoring  DVT prophylaxis: Lovenox Code Status: Full   Family Communication: Discussed plan of care with the patient's daughter over the phone Disposition Plan: To be determined Consults called: None  Admission status:inpatient  Norval Morton MD [facility] Pager [phone]   If 7PM-7AM, please contact night-coverage [email or web address] Password [facility]  03/27/2018, 7:23 AM

## 2018-03-27 NOTE — Plan of Care (Signed)
Discussed with patient in front of family routine of department, pain management and importance of keeping foley in with some teach back displayed

## 2018-03-28 DIAGNOSIS — I481 Persistent atrial fibrillation: Secondary | ICD-10-CM

## 2018-03-28 DIAGNOSIS — I714 Abdominal aortic aneurysm, without rupture: Secondary | ICD-10-CM

## 2018-03-28 LAB — BASIC METABOLIC PANEL
ANION GAP: 10 (ref 5–15)
BUN: 16 mg/dL (ref 8–23)
CALCIUM: 8.5 mg/dL — AB (ref 8.9–10.3)
CO2: 26 mmol/L (ref 22–32)
CREATININE: 1.63 mg/dL — AB (ref 0.61–1.24)
Chloride: 99 mmol/L (ref 98–111)
GFR calc Af Amer: 42 mL/min — ABNORMAL LOW (ref >60)
GFR, EST NON AFRICAN AMERICAN: 36 mL/min — AB (ref >60)
Glucose, Bld: 112 mg/dL — ABNORMAL HIGH (ref 70–99)
Potassium: 4 mmol/L (ref 3.5–5.1)
Sodium: 135 mmol/L (ref 135–145)

## 2018-03-28 LAB — CBC
HCT: 38.1 % — ABNORMAL LOW (ref 39.0–52.0)
Hemoglobin: 12.3 g/dL — ABNORMAL LOW (ref 13.0–17.0)
MCH: 31.9 pg (ref 26.0–34.0)
MCHC: 32.3 g/dL (ref 30.0–36.0)
MCV: 99 fL (ref 78.0–100.0)
PLATELETS: 182 10*3/uL (ref 150–400)
RBC: 3.85 MIL/uL — ABNORMAL LOW (ref 4.22–5.81)
RDW: 12.6 % (ref 11.5–15.5)
WBC: 5.6 10*3/uL (ref 4.0–10.5)

## 2018-03-28 LAB — PSA, TOTAL AND FREE
PROSTATE SPECIFIC AG, SERUM: 1.7 ng/mL (ref 0.0–4.0)
PSA FREE: 0.39 ng/mL
PSA, Free Pct: 22.9 %

## 2018-03-28 MED ORDER — FAMOTIDINE 20 MG PO TABS
20.0000 mg | ORAL_TABLET | Freq: Every day | ORAL | Status: DC
  Administered 2018-03-28: 20 mg via ORAL
  Filled 2018-03-28: qty 1

## 2018-03-28 MED ORDER — DILTIAZEM HCL 30 MG PO TABS
30.0000 mg | ORAL_TABLET | Freq: Once | ORAL | Status: AC
  Administered 2018-03-28: 30 mg via ORAL

## 2018-03-28 MED ORDER — DILTIAZEM HCL 60 MG PO TABS
60.0000 mg | ORAL_TABLET | Freq: Four times a day (QID) | ORAL | Status: DC
  Administered 2018-03-28 – 2018-03-29 (×3): 60 mg via ORAL
  Filled 2018-03-28 (×3): qty 1

## 2018-03-28 MED ORDER — CYCLOSPORINE MODIFIED (NEORAL) 25 MG PO CAPS
125.0000 mg | ORAL_CAPSULE | Freq: Two times a day (BID) | ORAL | Status: DC
  Filled 2018-03-28: qty 1

## 2018-03-28 MED ORDER — PANTOPRAZOLE SODIUM 40 MG PO TBEC: 40.0000 mg | DELAYED_RELEASE_TABLET | Freq: Two times a day (BID) | ORAL | Status: DC

## 2018-03-28 MED ORDER — METOPROLOL TARTRATE 5 MG/5ML IV SOLN
2.5000 mg | Freq: Four times a day (QID) | INTRAVENOUS | Status: DC | PRN
  Administered 2018-03-28: 2.5 mg via INTRAVENOUS
  Filled 2018-03-28: qty 5

## 2018-03-28 MED ORDER — SODIUM CHLORIDE 0.9 % IV SOLN
INTRAVENOUS | Status: DC
  Administered 2018-03-28 – 2018-03-29 (×2): via INTRAVENOUS

## 2018-03-28 MED ORDER — CYCLOSPORINE MODIFIED (NEORAL) 25 MG PO CAPS
125.0000 mg | ORAL_CAPSULE | Freq: Two times a day (BID) | ORAL | Status: DC
  Administered 2018-03-28 – 2018-03-29 (×2): 125 mg via ORAL
  Filled 2018-03-28 (×3): qty 5

## 2018-03-28 NOTE — Progress Notes (Signed)
DAILY PROGRESS NOTE   Patient Name: [name] [name] Date of Encounter: 03/28/2018  Chief Complaint   Flank pain  Patient Profile   [name] [name] is a 84 y.o. male with a hx of HTN, CKD s/p renal transplant (followed by Dr. Jimmy Footman), HLD and TIA  who is being seen today for the evaluation of Afib at the request of Dr. Tamala Julian.   Subjective   More afib with RVR overnight. Increased cardizem to 60 mg q6 hr this am. Echo yesterday reviewed, LVEF 55-60%, mild AI, dilated ascending aorta to 3.9 cm, moderate to severe LAE, moderate TR and RVSP 40 mmHg (personally reviewed)  Objective   Vitals:   03/28/18 0500 03/28/18 0600 03/28/18 0704 03/28/18 0819  BP: 115/87 (!) 124/91 (!) 124/91 116/84  Pulse:      Resp:    18  Temp:    97.7 F (36.5 C)  TempSrc:    Oral  SpO2: 99% 99%  100%  Weight:  134 lb 7.7 oz (61 kg)    Height:        Intake/Output Summary (Last 24 hours) at 03/28/2018 2229 Last data filed at 03/27/2018 2334 Gross per 24 hour  Intake 844 ml  Output 500 ml  Net 344 ml   Filed Weights   03/27/18 0100 03/28/18 0600  Weight: 133 lb 2.5 oz (60.4 kg) 134 lb 7.7 oz (61 kg)    Physical Exam   General appearance: alert, cachectic, no distress and under warming blanket Lungs: clear to auscultation bilaterally Heart: irregularly irregular rhythm Extremities: extremities normal, atraumatic, no cyanosis or edema Neurologic: Grossly normal  Inpatient Medications    Scheduled Meds: . apixaban  2.5 mg Oral BID  . aspirin  81 mg Oral Daily  . diltiazem  30 mg Oral Q6H  . famotidine  20 mg Oral QHS  . feeding supplement (ENSURE ENLIVE)  237 mL Oral TID BM  . mycophenolate  180 mg Oral BID  . predniSONE  5 mg Oral Q breakfast  . sodium chloride flush  3 mL Intravenous Q12H    Continuous Infusions: . sodium chloride 10 mL/hr at 03/27/18 0004  . sodium chloride 10 mL/hr at 03/27/18 2252  . cycloSPORINE modified (NEORAL) 125 mg    . piperacillin-tazobactam (ZOSYN)  IV  3.375 g (03/28/18 0710)    PRN Meds: sodium chloride, sodium chloride, acetaminophen **OR** acetaminophen, HYDROcodone-acetaminophen, metoprolol tartrate, ondansetron **OR** ondansetron (ZOFRAN) IV   Labs   Results for orders placed or performed during the hospital encounter of 03/26/18 (from the past 48 hour(s))  Culture, blood (Routine x 2)     Status: None (Preliminary result)   Collection Time: 03/26/18  4:00 PM  Result Value Ref Range   Specimen Description BLOOD RIGHT ARM    Special Requests      BOTTLES DRAWN AEROBIC AND ANAEROBIC Blood Culture results may not be optimal due to an inadequate volume of blood received in culture bottles   Culture      NO GROWTH < 24 HOURS Performed at [facility] [address], [place], [place] [address]    Report Status PENDING   Comprehensive metabolic panel     Status: Abnormal   Collection Time: 03/26/18  4:16 PM  Result Value Ref Range   Sodium 132 (L) 135 - 145 mmol/L   Potassium 4.1 3.5 - 5.1 mmol/L   Chloride 96 (L) 98 - 111 mmol/L    Comment: Please note change in reference range.  CO2 27 22 - 32 mmol/L   Glucose, Bld 90 70 - 99 mg/dL    Comment: Please note change in reference range.   BUN 19 8 - 23 mg/dL    Comment: Please note change in reference range.   Creatinine, Ser 1.52 (H) 0.61 - 1.24 mg/dL   Calcium 8.8 (L) 8.9 - 10.3 mg/dL   Total Protein 7.6 6.5 - 8.1 g/dL   Albumin 2.9 (L) 3.5 - 5.0 g/dL   AST 24 15 - 41 U/L   ALT 11 0 - 44 U/L    Comment: Please note change in reference range.   Alkaline Phosphatase 71 38 - 126 U/L   Total Bilirubin 0.7 0.3 - 1.2 mg/dL   GFR calc non Af Amer 39 (L) >60 mL/min   GFR calc Af Amer 46 (L) >60 mL/min    Comment: (NOTE) The eGFR has been calculated using the CKD EPI equation. This calculation has not been validated in all clinical situations. eGFR's persistently <60 mL/min signify possible Chronic Kidney Disease.    Anion gap 9 5 - 15    Comment: Performed at [facility] [address], [place], [place] [address]  CBC with Differential     Status: Abnormal   Collection Time: 03/26/18  4:16 PM  Result Value Ref Range   WBC 4.3 4.0 - 10.5 K/uL   RBC 4.23 4.22 - 5.81 MIL/uL   Hemoglobin 13.3 13.0 - 17.0 g/dL   HCT 42.4 39.0 - 52.0 %   MCV 100.2 (H) 78.0 - 100.0 fL   MCH 31.4 26.0 - 34.0 pg   MCHC 31.4 30.0 - 36.0 g/dL   RDW 12.5 11.5 - 15.5 %   Platelets 195 150 - 400 K/uL   Neutrophils Relative % 59 %   Neutro Abs 2.6 1.7 - 7.7 K/uL   Lymphocytes Relative 32 %   Lymphs Abs 1.4 0.7 - 4.0 K/uL   Monocytes Relative 7 %   Monocytes Absolute 0.3 0.1 - 1.0 K/uL   Eosinophils Relative 1 %   Eosinophils Absolute 0.0 0.0 - 0.7 K/uL   Basophils Relative 1 %   Basophils Absolute 0.0 0.0 - 0.1 K/uL   Immature Granulocytes 0 %   Abs Immature Granulocytes 0.0 0.0 - 0.1 K/uL    Comment: Performed at [facility] [address] [place], [place] [address]  Protime-INR     Status: None   Collection Time: 03/26/18  4:16 PM  Result Value Ref Range   Prothrombin Time 14.7 11.4 - 15.2 seconds   INR 1.16     Comment: Performed at [facility] [address] [place], [place] [address]  Culture, blood (Routine x 2)     Status: None (Preliminary result)   Collection Time: 03/26/18  4:16 PM  Result Value Ref Range   Specimen Description BLOOD LEFT ARM    Special Requests      BOTTLES DRAWN AEROBIC AND ANAEROBIC Blood Culture adequate volume   Culture      NO GROWTH < 24 HOURS Performed at [facility], [place] [address] [place], [place] [address]    Report Status PENDING   I-Stat CG4 Lactic Acid, ED     Status: Abnormal   Collection Time: 03/26/18  4:38 PM  Result Value Ref Range   Lactic Acid, Venous 2.49 (HH) 0.5 - 1.9 mmol/L   Comment NOTIFIED PHYSICIAN   Lipase, blood     Status: None  Collection Time: 03/26/18  5:53 PM  Result Value Ref Range   Lipase 35 11 - 51 U/L    Comment: Performed at [facility], [place] [address] [place], [place] [address]  I-Stat Troponin, ED (not at [facility])     Status: None   Collection Time: 03/26/18  6:07 PM  Result Value Ref Range   Troponin i, poc 0.08 0.00 - 0.08 ng/mL   Comment 3            Comment: Due to the release kinetics of cTnI, a negative result within the first hours of the onset of symptoms does not rule out myocardial infarction with certainty. If myocardial infarction is still suspected, repeat the test at appropriate intervals.   I-Stat CG4 Lactic Acid, ED     Status: Abnormal   Collection Time: 03/26/18  6:09 PM  Result Value Ref Range   Lactic Acid, Venous 2.72 (HH) 0.5 - 1.9 mmol/L  Urinalysis, Routine w reflex microscopic     Status: Abnormal   Collection Time: 03/26/18  7:40 PM  Result Value Ref Range   Color, Urine YELLOW YELLOW   APPearance CLEAR CLEAR   Specific Gravity, Urine 1.010 1.005 - 1.030   pH 5.0 5.0 - 8.0   Glucose, UA NEGATIVE NEGATIVE mg/dL   Hgb urine dipstick MODERATE (A) NEGATIVE   Bilirubin Urine NEGATIVE NEGATIVE   Ketones, ur NEGATIVE NEGATIVE mg/dL   Protein, ur 100 (A) NEGATIVE mg/dL   Nitrite NEGATIVE NEGATIVE   Leukocytes, UA NEGATIVE NEGATIVE   RBC / HPF 0-5 0 - 5 RBC/hpf   WBC, UA 0-5 0 - 5 WBC/hpf   Bacteria, UA NONE SEEN NONE SEEN   Squamous Epithelial / LPF 0-5 0 - 5   Mucus PRESENT     Comment: Performed at [facility], [address] [place], [place] [address]  I-Stat Troponin, ED (not at [facility])     Status: None   Collection Time: 03/26/18 10:00 PM  Result Value Ref Range   Troponin i, poc 0.06 0.00 - 0.08 ng/mL   Comment 3            Comment: Due to the release kinetics of cTnI, a negative result within the first hours of the onset of symptoms does not rule out myocardial infarction with certainty. If myocardial infarction is still suspected, repeat the test at appropriate intervals.   PSA, total and free     Status: None   Collection Time: 03/26/18 11:44 PM  Result Value Ref Range   PSA,  Free 0.39 N/A ng/mL    Comment: Roche ECLIA methodology.   PSA, Free Pct 22.9 %    Comment: (NOTE) The table below lists the probability of prostate cancer for men with non-suspicious DRE results and total PSA between 4 and 10 ng/mL, by patient age Ricci Barker, [place], 627:[id number]).                  % Free PSA       50-64 yr        65-75 yr                  0.00-10.00%        56%             55%                 10.01-15.00%        24%  35%                 15.01-20.00%        17%             23%                 20.01-25.00%        10%             20%                      >25.00%         5%              9% Please note:  Catalona et al did not make specific              recommendations regarding the use of              percent free PSA for any other population              of men. Performed At: [facility] [place], [place] [id number] Rush Farmer MD [id number]    Prostate Specific Ag, Serum 1.7 0.0 - 4.0 ng/mL    Comment: (NOTE) Roche ECLIA methodology. According to the American Urological Association, Serum PSA should decrease and remain at undetectable levels after radical prostatectomy. The AUA defines biochemical recurrence as an initial PSA value 0.2 ng/mL or greater followed by a subsequent confirmatory PSA value 0.2 ng/mL or greater. Values obtained with different assay methods or kits cannot be used interchangeably. Results cannot be interpreted as absolute evidence of the presence or absence of malignant disease.   Lactic acid, plasma     Status: None   Collection Time: 03/26/18 11:44 PM  Result Value Ref Range   Lactic Acid, Venous 1.4 0.5 - 1.9 mmol/L    Comment: Performed at [facility] [address] [place], [place] [address]  Procalcitonin     Status: None   Collection Time: 03/26/18 11:44 PM  Result Value Ref Range   Procalcitonin <0.10 ng/mL    Comment:        Interpretation: PCT (Procalcitonin) <= 0.5  ng/mL: Systemic infection (sepsis) is not likely. Local bacterial infection is possible. (NOTE)       Sepsis PCT Algorithm           Lower Respiratory Tract                                      Infection PCT Algorithm    ----------------------------     ----------------------------         PCT < 0.25 ng/mL                PCT < 0.10 ng/mL         Strongly encourage             Strongly discourage   discontinuation of antibiotics    initiation of antibiotics    ----------------------------     -----------------------------       PCT 0.25 - 0.50 ng/mL            PCT 0.10 - 0.25 ng/mL               OR       >80% decrease in PCT  Discourage initiation of                                            antibiotics      Encourage discontinuation           of antibiotics    ----------------------------     -----------------------------         PCT >= 0.50 ng/mL              PCT 0.26 - 0.50 ng/mL               AND        <80% decrease in PCT             Encourage initiation of                                             antibiotics       Encourage continuation           of antibiotics    ----------------------------     -----------------------------        PCT >= 0.50 ng/mL                  PCT > 0.50 ng/mL               AND         increase in PCT                  Strongly encourage                                      initiation of antibiotics    Strongly encourage escalation           of antibiotics                                     -----------------------------                                           PCT <= 0.25 ng/mL                                                 OR                                        > 80% decrease in PCT                                     Discontinue / Do not initiate  antibiotics Performed at [facility], [place] [address], [place], [place] [address]   TSH     Status: None   Collection Time:  03/26/18 11:44 PM  Result Value Ref Range   TSH 1.972 0.350 - 4.500 uIU/mL    Comment: Performed by a 3rd Generation assay with a functional sensitivity of <=0.01 uIU/mL. Performed at [facility], [place] [address], [place], [place] [address]   Magnesium     Status: Abnormal   Collection Time: 03/26/18 11:44 PM  Result Value Ref Range   Magnesium 1.6 (L) 1.7 - 2.4 mg/dL    Comment: Performed at [facility] [address], [place], [place] [address]  Troponin I     Status: Abnormal   Collection Time: 03/26/18 11:44 PM  Result Value Ref Range   Troponin I 0.08 (HH) <0.03 ng/mL    Comment: CRITICAL RESULT CALLED TO, READ BACK BY AND VERIFIED WITH: T.NEILSON,RN 0138 03/27/18 [email or web address] Performed at [facility], [place] [address] [place], [place] [address]   C-reactive protein     Status: Abnormal   Collection Time: 03/26/18 11:44 PM  Result Value Ref Range   CRP 2.9 (H) <1.0 mg/dL    Comment: Performed at [facility] [address] [place], [place] [address]  Sedimentation rate     Status: Abnormal   Collection Time: 03/26/18 11:44 PM  Result Value Ref Range   Sed Rate 46 (H) 0 - 16 mm/hr    Comment: Performed at [facility] [address] [place], [place] [address]  MRSA PCR Screening     Status: None   Collection Time: 03/27/18 12:59 AM  Result Value Ref Range   MRSA by PCR NEGATIVE NEGATIVE    Comment:        The GeneXpert MRSA Assay (FDA approved for NASAL specimens only), is one component of a comprehensive MRSA colonization surveillance program. It is not intended to diagnose MRSA infection nor to guide or monitor treatment for MRSA infections. Performed at [facility], [place] [address] [place], [place] [address]   CBC     Status: Abnormal   Collection Time: 03/27/18  5:25 AM  Result Value Ref Range   WBC 5.2 4.0 - 10.5 K/uL   RBC 3.73 (L) 4.22 - 5.81 MIL/uL   Hemoglobin 11.7 (L) 13.0 - 17.0 g/dL   HCT 36.2 (L) 39.0 - 52.0 %     MCV 97.1 78.0 - 100.0 fL   MCH 31.4 26.0 - 34.0 pg   MCHC 32.3 30.0 - 36.0 g/dL   RDW 12.3 11.5 - 15.5 %   Platelets 178 150 - 400 K/uL    Comment: Performed at [facility], [place] [address], [place],  [address]  Basic metabolic panel     Status: Abnormal   Collection Time: 03/27/18  5:25 AM  Result Value Ref Range   Sodium 134 (L) 135 - 145 mmol/L   Potassium 4.0 3.5 - 5.1 mmol/L   Chloride 101 98 - 111 mmol/L    Comment: Please note change in reference range.   CO2 25 22 - 32 mmol/L   Glucose, Bld 72 70 - 99 mg/dL    Comment: Please note change in reference range.   BUN 19 8 - 23 mg/dL    Comment: Please note change in reference range.   Creatinine, Ser 1.41 (H) 0.61 - 1.24 mg/dL   Calcium 8.5 (L) 8.9 -  10.3 mg/dL   GFR calc non Af Amer 43 (L) >60 mL/min   GFR calc Af Amer 50 (L) >60 mL/min    Comment: (NOTE) The eGFR has been calculated using the CKD EPI equation. This calculation has not been validated in all clinical situations. eGFR's persistently <60 mL/min signify possible Chronic Kidney Disease.    Anion gap 8 5 - 15    Comment: Performed at [facility] [address], [place], [place] [address]  Troponin I     Status: Abnormal   Collection Time: 03/27/18  5:25 AM  Result Value Ref Range   Troponin I 0.09 (HH) <0.03 ng/mL    Comment: CRITICAL VALUE NOTED.  VALUE IS CONSISTENT WITH PREVIOUSLY REPORTED AND CALLED VALUE. Performed at [facility], [place] [address] [place], [place] [address]   Troponin I     Status: Abnormal   Collection Time: 03/27/18  9:28 AM  Result Value Ref Range   Troponin I 0.07 (HH) <0.03 ng/mL    Comment: CRITICAL VALUE NOTED.  VALUE IS CONSISTENT WITH PREVIOUSLY REPORTED AND CALLED VALUE. Performed at [facility], [place] [address], [place], [place] [address]   Magnesium     Status: None   Collection Time: 03/27/18  9:28 AM  Result Value Ref Range   Magnesium 2.1 1.7 - 2.4 mg/dL    Comment: Performed at  [facility], [place] [address], [place], [place] [address]  CBC     Status: Abnormal   Collection Time: 03/28/18  8:41 AM  Result Value Ref Range   WBC 5.6 4.0 - 10.5 K/uL   RBC 3.85 (L) 4.22 - 5.81 MIL/uL   Hemoglobin 12.3 (L) 13.0 - 17.0 g/dL   HCT 38.1 (L) 39.0 - 52.0 %   MCV 99.0 78.0 - 100.0 fL   MCH 31.9 26.0 - 34.0 pg   MCHC 32.3 30.0 - 36.0 g/dL   RDW 12.6 11.5 - 15.5 %   Platelets 182 150 - 400 K/uL    Comment: Performed at [facility], [place] [address], [place], [place] [address]    ECG   N/A  Telemetry   A-fib with RVR overnight - Personally Reviewed  Radiology    Dg Chest 2 View  Result Date: 03/26/2018 CLINICAL DATA:  Hypotension. EXAM: CHEST - 2 VIEW COMPARISON:  CT abdomen and pelvis dated August 30, 2005. FINDINGS: The heart size and mediastinal contours are within normal limits. Normal pulmonary vascularity. Stable cystic changes at the left greater than right lung base. No focal consolidation, pleural effusion, or pneumothorax. Scattered calcified pleural plaques. No acute osseous abnormality. IMPRESSION: 1.  No active cardiopulmonary disease. 2. Scattered calcified pleural plaques, consistent with prior space cyst exposure. Electronically Signed   By: Titus Dubin M.D.   On: 03/26/2018 17:00   Ct Chest W Contrast  Result Date: 03/26/2018 CLINICAL DATA:  Failure to thrive, decreased appetite, weight loss, fatigue. EXAM: CT CHEST, ABDOMEN, AND PELVIS WITH CONTRAST TECHNIQUE: Multidetector CT imaging of the chest, abdomen and pelvis was performed following the standard protocol during bolus administration of intravenous contrast. CONTRAST:  168m OMNIPAQUE IOHEXOL 300 MG/ML  SOLN COMPARISON:  CT of the abdomen and pelvis without contrast on 09/01/2005 FINDINGS: CT CHEST FINDINGS Cardiovascular: The aortic root measures approximately 4.4 cm. There is mild dilatation of the ascending thoracic aorta measuring 4.3 cm. The aortic arch is also mildly dilated at  3.8 cm and the descending thoracic aorta measures  3 cm. No evidence of aortic dissection. Proximal great vessels are normally patent. Right-sided heart chambers are enlarged. There is prominent reflux of contrast into the intrahepatic IVC and diffusely throughout the hepatic veins. Findings are consistent with right heart failure and elevated right heart pressures. No pericardial effusion. Scattered calcified plaque is noted in the coronary arteries. Central pulmonary arteries are normal in caliber. Mediastinum/Nodes: No enlarged mediastinal, hilar, or axillary lymph nodes. Thyroid gland, trachea, and esophagus demonstrate no significant findings. Lungs/Pleura: Multiple calcified pleural plaques are present bilaterally consistent with asbestos exposure. There is significant emphysematous lung disease and cystic disease at the bases. Upper lung zones bilaterally as well as the superior segment of the left lower lobe demonstrate mild patchy areas of ground-glass opacity. This is felt to most likely be on the basis of mild pulmonary edema. No focal pulmonary masses. No pneumothorax. No pleural fluid identified. Musculoskeletal: There are some focal dense sclerotic areas in the ribs including the right anterior third, right fourth, left first, left third and left fifth ribs. No other bony lesions identified in the chest. Vertebral bodies show no evidence of fracture. CT ABDOMEN PELVIS FINDINGS Hepatobiliary: No focal liver abnormality is seen. No gallstones, gallbladder wall thickening, or biliary dilatation. Pancreas: There is diffuse dilatation of the pancreatic duct up to approximately 5 mm. The duct appears smooth and regular with no side branch dilatation. No evidence of visible mass, significant pancreatic atrophy or ductal calculi. No evidence of overt pancreatitis or surrounding fluid collections. Spleen: Normal in size without focal abnormality. Adrenals/Urinary Tract: Status post prior bilateral nephrectomy. A  left pelvic transplant kidney is present. This demonstrates no hydronephrosis. Delayed imaging shows excretion of contrast by the transplant kidney and flow of contrast into the transplant ureter and bladder. No adrenal masses. Stomach/Bowel: Bowel shows no evidence of obstruction. There may be some mild wall thickening involving the distal stomach and/or proximal duodenum. No evidence of free air. Diffuse diverticulosis present of the sigmoid colon without evidence of diverticulitis. Vascular/Lymphatic: Atherosclerosis of the distal aorta and iliac arteries without evidence of aneurysmal disease. Reproductive: Mild prostate enlargement. Other: No abdominal wall hernia or abnormality. No abdominopelvic ascites. Musculoskeletal: Area of sclerosis in the right superior pubic bone. Mild loss of height of the L3 and L4 vertebral bodies with superior endplate compression. No other bony lesions identified. IMPRESSION: 1. Mild aneurysmal disease of the ascending thoracic aorta measuring 4.3 cm in greatest diameter. No dissection. Recommend annual imaging followup by CTA or MRA. This recommendation follows 2010 ACCF/AHA/AATS/ACR/ASA/SCA/SCAI/SIR/STS/SVM Guidelines for the Diagnosis and Management of Patients with Thoracic Aortic Disease. Circulation. 2010; 121: Z662-H476 2. Evidence of significant right heart failure. Probable early alveolar edema in the upper lung zones bilaterally. 3. Sclerotic foci in several bilateral ribs as well as the right superior pubic bone. These are fairly well-circumscribed and very dense. These may all represent benign bone islands. Sclerotic bony metastatic disease cannot be entirely excluded and correlation with PSA is recommended. 4. Evidence of prior asbestos exposure with multiple bilateral calcified pleural plaques. Associated significant chronic lung disease with advanced cystic changes at the lung bases bilaterally. 5. Unexplained diffuse dilatation of the pancreatic duct without  obvious focal mass. This may be on the basis of prior pancreatitis. 6. 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 7. Possible mild wall thickening involving the distal stomach and/or proximal duodenum. This may be on the basis of peptic disease. Electronically Signed   By: Jenness Corner.D.  On: 03/26/2018 21:02   Ct Abdomen Pelvis W Contrast  Result Date: 03/26/2018 CLINICAL DATA:  Failure to thrive, decreased appetite, weight loss, fatigue. EXAM: CT CHEST, ABDOMEN, AND PELVIS WITH CONTRAST TECHNIQUE: Multidetector CT imaging of the chest, abdomen and pelvis was performed following the standard protocol during bolus administration of intravenous contrast. CONTRAST:  123m OMNIPAQUE IOHEXOL 300 MG/ML  SOLN COMPARISON:  CT of the abdomen and pelvis without contrast on 09/01/2005 FINDINGS: CT CHEST FINDINGS Cardiovascular: The aortic root measures approximately 4.4 cm. There is mild dilatation of the ascending thoracic aorta measuring 4.3 cm. The aortic arch is also mildly dilated at 3.8 cm and the descending thoracic aorta measures 3 cm. No evidence of aortic dissection. Proximal great vessels are normally patent. Right-sided heart chambers are enlarged. There is prominent reflux of contrast into the intrahepatic IVC and diffusely throughout the hepatic veins. Findings are consistent with right heart failure and elevated right heart pressures. No pericardial effusion. Scattered calcified plaque is noted in the coronary arteries. Central pulmonary arteries are normal in caliber. Mediastinum/Nodes: No enlarged mediastinal, hilar, or axillary lymph nodes. Thyroid gland, trachea, and esophagus demonstrate no significant findings. Lungs/Pleura: Multiple calcified pleural plaques are present bilaterally consistent with asbestos exposure. There is significant emphysematous lung disease and cystic disease at the bases. Upper lung zones bilaterally as well as the superior  segment of the left lower lobe demonstrate mild patchy areas of ground-glass opacity. This is felt to most likely be on the basis of mild pulmonary edema. No focal pulmonary masses. No pneumothorax. No pleural fluid identified. Musculoskeletal: There are some focal dense sclerotic areas in the ribs including the right anterior third, right fourth, left first, left third and left fifth ribs. No other bony lesions identified in the chest. Vertebral bodies show no evidence of fracture. CT ABDOMEN PELVIS FINDINGS Hepatobiliary: No focal liver abnormality is seen. No gallstones, gallbladder wall thickening, or biliary dilatation. Pancreas: There is diffuse dilatation of the pancreatic duct up to approximately 5 mm. The duct appears smooth and regular with no side branch dilatation. No evidence of visible mass, significant pancreatic atrophy or ductal calculi. No evidence of overt pancreatitis or surrounding fluid collections. Spleen: Normal in size without focal abnormality. Adrenals/Urinary Tract: Status post prior bilateral nephrectomy. A left pelvic transplant kidney is present. This demonstrates no hydronephrosis. Delayed imaging shows excretion of contrast by the transplant kidney and flow of contrast into the transplant ureter and bladder. No adrenal masses. Stomach/Bowel: Bowel shows no evidence of obstruction. There may be some mild wall thickening involving the distal stomach and/or proximal duodenum. No evidence of free air. Diffuse diverticulosis present of the sigmoid colon without evidence of diverticulitis. Vascular/Lymphatic: Atherosclerosis of the distal aorta and iliac arteries without evidence of aneurysmal disease. Reproductive: Mild prostate enlargement. Other: No abdominal wall hernia or abnormality. No abdominopelvic ascites. Musculoskeletal: Area of sclerosis in the right superior pubic bone. Mild loss of height of the L3 and L4 vertebral bodies with superior endplate compression. No other bony  lesions identified. IMPRESSION: 1. Mild aneurysmal disease of the ascending thoracic aorta measuring 4.3 cm in greatest diameter. No dissection. Recommend annual imaging followup by CTA or MRA. This recommendation follows 2010 ACCF/AHA/AATS/ACR/ASA/SCA/SCAI/SIR/STS/SVM Guidelines for the Diagnosis and Management of Patients with Thoracic Aortic Disease. Circulation. 2010; 121: [id number]. Evidence of significant right heart failure. Probable early alveolar edema in the upper lung zones bilaterally. 3. Sclerotic foci in several bilateral ribs as well as the right superior pubic bone. These are  fairly well-circumscribed and very dense. These may all represent benign bone islands. Sclerotic bony metastatic disease cannot be entirely excluded and correlation with PSA is recommended. 4. Evidence of prior asbestos exposure with multiple bilateral calcified pleural plaques. Associated significant chronic lung disease with advanced cystic changes at the lung bases bilaterally. 5. Unexplained diffuse dilatation of the pancreatic duct without obvious focal mass. This may be on the basis of prior pancreatitis. 6. 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 7. Possible mild wall thickening involving the distal stomach and/or proximal duodenum. This may be on the basis of peptic disease. Electronically Signed   By: Aletta Edouard M.D.   On: 03/26/2018 21:02   Dg Esophagus  Result Date: 03/27/2018 CLINICAL DATA:  Difficulty swallowing, dysphagia EXAM: ESOPHOGRAM/BARIUM SWALLOW TECHNIQUE: Single contrast examination was performed using  thin barium. FLUOROSCOPY TIME:  Fluoroscopy Time:  3 minutes 24 seconds Radiation Exposure Index (if provided by the fluoroscopic device): Number of Acquired Spot Images: 0 COMPARISON:  CT 03/26/2018 FINDINGS: Fluoroscopic evaluation of swallowing demonstrates disruption of primary esophageal peristaltic waves. There is no fixed stricture or fold  thickening. Small hiatal hernia noted. Just above the hiatal hernia, there is a moderate-sized distal esophageal diverticulum. No reflux with the water siphon maneuver. The patient swallowed a 13 mm barium tablet which sticks in the distal esophagus despite the lack of visible fixed stricture in the area. IMPRESSION: Esophageal dysmotility. Small hiatal hernia. Moderate-sized distal esophageal diverticulum. The 13 mm barium tablet sticks in the distal esophagus above the diverticulum despite lack of visible fixed stricture. Electronically Signed   By: Rolm Baptise M.D.   On: 03/27/2018 13:44   Dg Chest Port 1 View  Result Date: 03/27/2018 CLINICAL DATA:  84 year old male with sepsis. EXAM: PORTABLE CHEST 1 VIEW COMPARISON:  Chest CT dated 03/26/2018 FINDINGS: There is emphysematous changes of the lungs with interstitial coarsening. Small scattered hazy areas primarily in the left lung may be chronic although developing infiltrate is not excluded. Clinical correlation is recommended. There is no focal consolidation, pleural effusion, or pneumothorax. Small calcified pleural plaques noted. Mild cardiomegaly. The thoracic aorta is tortuous with atherosclerotic calcification. There is degenerative changes of the spine. No acute osseous pathology. IMPRESSION: 1. Emphysema.  No focal consolidation or pneumothorax. 2. Areas of hazy density may be chronic or represent developing infiltrate. Clinical correlation is recommended. Electronically Signed   By: Anner Crete M.D.   On: 03/27/2018 00:11    Cardiac Studies   LV EF: 55% -   60%  ------------------------------------------------------------------- Indications:      Atrial fibrillation - 427.31.  ------------------------------------------------------------------- Study Conclusions  - Left ventricle: The cavity size was normal. There was mild   concentric hypertrophy. Systolic function was normal. The   estimated ejection fraction was in the  range of 55% to 60%. Wall   motion was normal; there were no regional wall motion   abnormalities. - Aortic valve: Transvalvular velocity was within the normal range.   There was no stenosis. There was mild regurgitation. - Aorta: Ascending aortic diameter: 39 mm (S). - Ascending aorta: The ascending aorta was mildly dilated. - Mitral valve: Transvalvular velocity was within the normal range.   There was no evidence for stenosis. There was no regurgitation. - Left atrium: The atrium was moderately to severely dilated. - Right ventricle: The cavity size was normal. Wall thickness was   normal. Systolic function was normal. - Atrial septum: No defect or patent foramen ovale was  identified   by color flow Doppler. - Tricuspid valve: There was moderate regurgitation. - Pulmonary arteries: Systolic pressure was mildly increased. PA   peak pressure: 40 mm Hg (S).  Assessment   1. Active Problems: 2.   SIRS (systemic inflammatory response syndrome) (HCC) 3.   Bone lesion 4.   AAA (abdominal aortic aneurysm) ([place]) 5.   BPH (benign prostatic hyperplasia) 6.   Weight loss 7.   Hypotension 8.   Unspecified atrial fibrillation ([place]) 9.   Protein-calorie malnutrition, severe 10.   Plan   1. Afib with RVR overnight - increased diltiazem to 60 mg q 4 hrs with good response. Monitor BP. Echo reassuring with normal LV function, mild AI, moderate to severe LAE - therefore, would pursue rate control over rhythm control strategy. He is now on low dose Eliquis.  Time Spent Directly with Patient:  I have spent a total of 25 minutes with the patient reviewing hospital notes, telemetry, EKGs, labs and examining the patient as well as establishing an assessment and plan that was discussed personally with the patient.  > 50% of time was spent in direct patient care.  Length of Stay:  LOS: 2 days   Pixie Casino, MD, [facility], [place] Director of the Advanced Lipid  Disorders &  Card[facility] Diplomate of the American Board of Clinical Lipidology Attending Cardiologist  Direct Dial: [phone]  Fax: [phone]  Website:  [email or web address] 03/28/2018, 9:17 AM

## 2018-03-28 NOTE — Evaluation (Signed)
Physical Therapy Evaluation Patient Details Name: [name] [name] MRN: [id number] DOB: 1928-08-16 Today's Date: 03/28/2018   History of Present Illness  Pt is an 84 y.o. male admitted 03/26/18 admitted with c/o malaise, urinary frequency, nausea, poor appetite. Worked up for SIRS,  a-fib with RVR, acute on CKD III, severe malnutrition. Abdominal/pelvic CT shows mild ascending thoracic aorta aneurysm; sclerotic foci in several bilateral ribs and R superior pubic bone that may represent benign bone islands or metastatic disease. PMH includes HTN, anemia, CKD s/p renal transplant.    Clinical Impression  Pt presents with an overall decrease in functional mobility secondary to above. PTA, pt indep and lives with daughter. Reports increasing difficulty walking longer distances due to fatigue. Today, pt transfers and ambulates at supervision-level; VSS with SpO2 91% on RA. Recommend use of rollator for energy conservation when ambulating community distances. Pt would benefit from continued acute PT services to maximize functional mobility and independence prior to d/c home.     Follow Up Recommendations No PT follow up;Supervision - Intermittent    Equipment Recommendations  (rollator)    Recommendations for Other Services       Precautions / Restrictions Precautions Precautions: Fall Restrictions Weight Bearing Restrictions: No      Mobility  Bed Mobility Overal bed mobility: Independent                Transfers Overall transfer level: Needs assistance Equipment used: None Transfers: Sit to/from Stand Sit to Stand: Supervision            Ambulation/Gait Ambulation/Gait assistance: Supervision   Assistive device: None Gait Pattern/deviations: Step-through pattern;Decreased stride length Gait velocity: Decreased Gait velocity interpretation: 1.31 - 2.62 ft/sec, indicative of limited community ambulator General Gait Details: Amb in room with supervision for  safety  Stairs            Wheelchair Mobility    Modified Rankin (Stroke Patients Only)       Balance Overall balance assessment: Needs assistance   Sitting balance-Leahy Scale: Good Sitting balance - Comments: Indep to don socks sitting EOB with increased time     Standing balance-Leahy Scale: Good               High level balance activites: Backward walking;Direction changes;Turns High Level Balance Comments: No overt instability or LOB              Pertinent Vitals/Pain Pain Assessment: No/denies pain    Home Living Family/patient expects to be discharged to:: Private residence Living Arrangements: Children Available Help at Discharge: Family;Available PRN/intermittently Type of Home: House Home Access: Level entry     Home Layout: One level Home Equipment: None      Prior Function Level of Independence: Needs assistance   Gait / Transfers Assistance Needed: Indep with ambulation; daughter reports fatigue when walking at grocery store. Family drives  ADL's / Homemaking Assistance Needed: Indep with ADLs        Hand Dominance        Extremity/Trunk Assessment   Upper Extremity Assessment Upper Extremity Assessment: Overall WFL for tasks assessed    Lower Extremity Assessment Lower Extremity Assessment: Overall WFL for tasks assessed       Communication   Communication: Expressive difficulties;HOH  Cognition Arousal/Alertness: Awake/alert Behavior During Therapy: WFL for tasks assessed/performed Overall Cognitive Status: Within Functional Limits for tasks assessed  General Comments General comments (skin integrity, edema, etc.): Daughter and son present throughout session    Exercises     Assessment/Plan    PT Assessment Patient needs continued PT services  PT Problem List Decreased activity tolerance;Decreased mobility;Decreased knowledge of use of DME       PT  Treatment Interventions DME instruction;Gait training;Stair training;Functional mobility training;Therapeutic activities;Therapeutic exercise;Balance training;Patient/family education    PT Goals (Current goals can be found in the Care Plan section)  Acute Rehab PT Goals Patient Stated Goal: Get back to mowing the yard PT Goal Formulation: With patient Time For Goal Achievement: 04/11/18 Potential to Achieve Goals: Fair    Frequency Min 3X/week   Barriers to discharge        Co-evaluation               AM-PAC PT "6 Clicks" Daily Activity  Outcome Measure Difficulty turning over in bed (including adjusting bedclothes, sheets and blankets)?: None Difficulty moving from lying on back to sitting on the side of the bed? : None Difficulty sitting down on and standing up from a chair with arms (e.g., wheelchair, bedside commode, etc,.)?: None Help needed moving to and from a bed to chair (including a wheelchair)?: A Little Help needed walking in hospital room?: A Little Help needed climbing 3-5 steps with a railing? : A Little 6 Click Score: 21    End of Session Equipment Utilized During Treatment: Gait belt Activity Tolerance: Patient tolerated treatment well Patient left: in bed;with call bell/phone within reach;with family/visitor present Nurse Communication: Mobility status PT Visit Diagnosis: Other abnormalities of gait and mobility (R26.89)    Time: 9509-3267 PT Time Calculation (min) (ACUTE ONLY): 29 min   Charges:   PT Evaluation $PT Eval Moderate Complexity: 1 Mod PT Treatments $Gait Training: 8-22 mins   PT G Codes:       Mabeline Caras, PT, DPT Acute Rehab Services  Pager: [place] 03/28/2018, 5:07 PM

## 2018-03-28 NOTE — Consult Note (Signed)
[facility] Renal Consultation Note  Requesting MD: Regalado Indication for Consultation: A on CRF- s/p renal transplant  HPI:  [name] [name] is a 84 y.o. male with past medical history significant for ESRD status post nephrectomy and acute kidney transplant, last one placed in 1994.  He is followed by Dr. Jimmy Footman and has very good allograft function with creatinine in the low 1's-last checked at [facility] in September 2018 was 1.2.  He also has hypertension and was on an ARB.  He presented on 7/16 at the emergency department with weakness.  He was found to be in A. fib with RVR and hypotensive-complained of fevers, chills, fatigue as well as nausea and a recent weight loss.  Work-up consisted of a contrasted CT(no hydro-) and urinalysis which was negative for signs of infection but did show 100 of protein he was on empiric antibiotics transiently but they are now being weaned.  Heart rate is still variable but better controlled.  He is no longer hypotensive.  His ARB has been held.  He is now on Lopressor and Cardizem.  Urine output appears adequate- according to family was down at first before foley placed.  Presenting creatinine was 1.5, then 1.4.  But 1.6 today and that is the reason for consult.  Multiple family members at bedside.  They report patient is back to normal.  He has been maintained on his cyclosporine, Myfortic and prednisone  Creatinine, Ser  Date/Time Value Ref Range Status  03/28/2018 09:01 AM 1.63 (H) 0.61 - 1.24 mg/dL Final  03/27/2018 05:25 AM 1.41 (H) 0.61 - 1.24 mg/dL Final  03/26/2018 04:16 PM 1.52 (H) 0.61 - 1.24 mg/dL Final     PMHx:   Past Medical History:  Diagnosis Date  . Anemia in CKD (chronic kidney disease)   . Arthritis   . Bilateral hydrocele   . CKD (chronic kidney disease), stage II    nephrologist-  deterding  . Diverticulosis   . Dysuria   . H/O kidney transplant    right 1980/  left 1994 with right transplanted kidney removal  .  Hematuria   . History of adenomatous polyp of colon    VILLOUS ADENOMA POLYP  . History of asbestos exposure   . History of condyloma acuminatum    genital  . 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 History of renal cell carcinoma    S/P  BILATERAL NEPHRECTOMY AND RENAL TRANSPLANTS  . History of small bowel obstruction    12/ 2006  due to adhesions -- resolved without surgerical intervention  . History of TIA (transient ischemic attack)   . Hyperlipidemia   . Hypertension   . Proteinuria   . Secondary hyperparathyroidism, renal ([place])   . Wears glasses     Past Surgical History:  Procedure Laterality Date  . COLONOSCOPY  10/17/2012   Procedure: COLONOSCOPY;  Surgeon: Beryle Beams, MD;  Location: [facility];  Service: Endoscopy;  Laterality: N/A;  . CYSTOSCOPY N/A 07/13/2015   Procedure: CYSTOSCOPY;  Surgeon: Irine Seal, MD;  Location: [facility];  Service: Urology;  Laterality: N/A;  . HEMICOLECTOMY Right 1995   adenoma polyp  . HYDROCELE EXCISION Bilateral 07/13/2015   Procedure: RIGHT HYDROCELECTOMY, ADULT DRAINAGE OF LEFT HYDROCELE;  Surgeon: Irine Seal, MD;  Location: [facility];  Service: Urology;  Laterality: Bilateral;  . KIDNEY TRANSPLANT  right 1980; left 1994---  [facility]   right kidney removed 1989/  left kidney removed 1994    Family Hx: History reviewed. No pertinent family history.  Social History:  reports that he has never smoked. He has never used smokeless tobacco. He reports that he does not drink alcohol or use drugs.  Allergies:  Allergies  Allergen Reactions  . Ciprofloxacin Rash  . Clindamycin/Lincomycin Rash  . Levaquin [Levofloxacin] Rash  . Septra [Sulfamethoxazole-Trimethoprim] Rash    Medications: Prior to Admission medications   Medication Sig Start Date End Date Taking? Authorizing Provider  aspirin EC 81 MG tablet Take 81 mg by mouth every morning.   Yes [provider]   cycloSPORINE modified (NEORAL) 100 MG capsule Take 100 mg by mouth 2 (two) times daily. 125 mg twice daily   Yes [provider]  cycloSPORINE modified (NEORAL) 25 MG capsule Take 25 mg by mouth 2 (two) times daily. 125 mg twice daily   Yes [provider]  docusate sodium (COLACE) 100 MG capsule Take 200 mg by mouth daily as needed for mild constipation.   Yes [provider]  HYDROcodone-acetaminophen (NORCO) 5-325 MG tablet Take 1 tablet by mouth every 6 (six) hours as needed for moderate pain. 07/13/15  Yes Irine Seal, MD  losartan (COZAAR) 100 MG tablet Take 100 mg by mouth at bedtime.    Yes [provider]  mycophenolate (MYFORTIC) 180 MG EC tablet Take 180 mg by mouth 2 (two) times daily.   Yes [provider]  predniSONE (DELTASONE) 5 MG tablet Take 5 mg by mouth daily with breakfast.   Yes [provider]  atorvastatin (LIPITOR) 40 MG tablet Take 40 mg by mouth at bedtime.    [provider]    I have reviewed the patient's current medications.  Labs:  Results for orders placed or performed during the hospital encounter of 03/26/18 (from the past 48 hour(s))  Culture, blood (Routine x 2)     Status: None (Preliminary result)   Collection Time: 03/26/18  4:00 PM  Result Value Ref Range   Specimen Description BLOOD RIGHT ARM    Special Requests      BOTTLES DRAWN AEROBIC AND ANAEROBIC Blood Culture results may not be optimal due to an inadequate volume of blood received in culture bottles   Culture      NO GROWTH < 24 HOURS Performed at [facility] [address], [place], [place] [address]    Report Status PENDING   Comprehensive metabolic panel     Status: Abnormal   Collection Time: 03/26/18  4:16 PM  Result Value Ref Range   Sodium 132 (L) 135 - 145 mmol/L   Potassium 4.1 3.5 - 5.1 mmol/L   Chloride 96 (L) 98 - 111 mmol/L    Comment: Please note change in reference range.   CO2 27 22 - 32 mmol/L    Glucose, Bld 90 70 - 99 mg/dL    Comment: Please note change in reference range.   BUN 19 8 - 23 mg/dL    Comment: Please note change in reference range.   Creatinine, Ser 1.52 (H) 0.61 - 1.24 mg/dL   Calcium 8.8 (L) 8.9 - 10.3 mg/dL   Total Protein 7.6 6.5 - 8.1 g/dL   Albumin 2.9 (L) 3.5 - 5.0 g/dL   AST 24 15 - 41 U/L   ALT 11 0 - 44 U/L    Comment: Please note change in reference range.   Alkaline Phosphatase 71 38 - 126 U/L   Total Bilirubin  0.7 0.3 - 1.2 mg/dL   GFR calc non Af Amer 39 (L) >60 mL/min   GFR calc Af Amer 46 (L) >60 mL/min    Comment: (NOTE) The eGFR has been calculated using the CKD EPI equation. This calculation has not been validated in all clinical situations. eGFR's persistently <60 mL/min signify possible Chronic Kidney Disease.    Anion gap 9 5 - 15    Comment: Performed at [facility] [address], [place], [place] [address]  CBC with Differential     Status: Abnormal   Collection Time: 03/26/18  4:16 PM  Result Value Ref Range   WBC 4.3 4.0 - 10.5 K/uL   RBC 4.23 4.22 - 5.81 MIL/uL   Hemoglobin 13.3 13.0 - 17.0 g/dL   HCT 42.4 39.0 - 52.0 %   MCV 100.2 (H) 78.0 - 100.0 fL   MCH 31.4 26.0 - 34.0 pg   MCHC 31.4 30.0 - 36.0 g/dL   RDW 12.5 11.5 - 15.5 %   Platelets 195 150 - 400 K/uL   Neutrophils Relative % 59 %   Neutro Abs 2.6 1.7 - 7.7 K/uL   Lymphocytes Relative 32 %   Lymphs Abs 1.4 0.7 - 4.0 K/uL   Monocytes Relative 7 %   Monocytes Absolute 0.3 0.1 - 1.0 K/uL   Eosinophils Relative 1 %   Eosinophils Absolute 0.0 0.0 - 0.7 K/uL   Basophils Relative 1 %   Basophils Absolute 0.0 0.0 - 0.1 K/uL   Immature Granulocytes 0 %   Abs Immature Granulocytes 0.0 0.0 - 0.1 K/uL    Comment: Performed at [facility] [address], [place], [place] [address]  Protime-INR     Status: None   Collection Time: 03/26/18  4:16 PM  Result Value Ref Range   Prothrombin Time 14.7 11.4 - 15.2 seconds   INR 1.16     Comment: Performed at  [facility] [address] [place], [place] [address]  Culture, blood (Routine x 2)     Status: None (Preliminary result)   Collection Time: 03/26/18  4:16 PM  Result Value Ref Range   Specimen Description BLOOD LEFT ARM    Special Requests      BOTTLES DRAWN AEROBIC AND ANAEROBIC Blood Culture adequate volume   Culture      NO GROWTH < 24 HOURS Performed at [facility], [place] [address] [place], [place] [address]    Report Status PENDING   I-Stat CG4 Lactic Acid, ED     Status: Abnormal   Collection Time: 03/26/18  4:38 PM  Result Value Ref Range   Lactic Acid, Venous 2.49 (HH) 0.5 - 1.9 mmol/L   Comment NOTIFIED PHYSICIAN   Lipase, blood     Status: None   Collection Time: 03/26/18  5:53 PM  Result Value Ref Range   Lipase 35 11 - 51 U/L    Comment: Performed at [facility], [place] [address], [place], [place] [address]  I-Stat Troponin, ED (not at [facility])     Status: None   Collection Time: 03/26/18  6:07 PM  Result Value Ref Range   Troponin i, poc 0.08 0.00 - 0.08 ng/mL   Comment 3            Comment: Due to the release kinetics of cTnI, a negative result within the first hours of the onset of symptoms does not rule out myocardial infarction with certainty. If myocardial  infarction is still suspected, repeat the test at appropriate intervals.   I-Stat CG4 Lactic Acid, ED     Status: Abnormal   Collection Time: 03/26/18  6:09 PM  Result Value Ref Range   Lactic Acid, Venous 2.72 (HH) 0.5 - 1.9 mmol/L  Urinalysis, Routine w reflex microscopic     Status: Abnormal   Collection Time: 03/26/18  7:40 PM  Result Value Ref Range   Color, Urine YELLOW YELLOW   APPearance CLEAR CLEAR   Specific Gravity, Urine 1.010 1.005 - 1.030   pH 5.0 5.0 - 8.0   Glucose, UA NEGATIVE NEGATIVE mg/dL   Hgb urine dipstick MODERATE (A) NEGATIVE   Bilirubin Urine NEGATIVE NEGATIVE   Ketones, ur NEGATIVE NEGATIVE mg/dL   Protein, ur 100 (A) NEGATIVE mg/dL   Nitrite NEGATIVE  NEGATIVE   Leukocytes, UA NEGATIVE NEGATIVE   RBC / HPF 0-5 0 - 5 RBC/hpf   WBC, UA 0-5 0 - 5 WBC/hpf   Bacteria, UA NONE SEEN NONE SEEN   Squamous Epithelial / LPF 0-5 0 - 5   Mucus PRESENT     Comment: Performed at [facility], [address], [place], [place] [address]  I-Stat Troponin, ED (not at [facility])     Status: None   Collection Time: 03/26/18 10:00 PM  Result Value Ref Range   Troponin i, poc 0.06 0.00 - 0.08 ng/mL   Comment 3            Comment: Due to the release kinetics of cTnI, a negative result within the first hours of the onset of symptoms does not rule out myocardial infarction with certainty. If myocardial infarction is still suspected, repeat the test at appropriate intervals.   PSA, total and free     Status: None   Collection Time: 03/26/18 11:44 PM  Result Value Ref Range   PSA, Free 0.39 N/A ng/mL    Comment: Roche ECLIA methodology.   PSA, Free Pct 22.9 %    Comment: (NOTE) The table below lists the probability of prostate cancer for men with non-suspicious DRE results and total PSA between 4 and 10 ng/mL, by patient age Ricci Barker, [place], [id number]).                  % Free PSA       50-64 yr        65-75 yr                  0.00-10.00%        56%             55%                 10.01-15.00%        24%             35%                 15.01-20.00%        17%             23%                 20.01-25.00%        10%             20%                      >25.00%         5%  9% Please note:  Catalona et al did not make specific              recommendations regarding the use of              percent free PSA for any other population              of men. Performed At: [facility] [place], [place] [id number] Rush Farmer MD QX:[id number]    Prostate Specific Ag, Serum 1.7 0.0 - 4.0 ng/mL    Comment: (NOTE) Roche ECLIA methodology. According to the American Urological Association, Serum PSA  should decrease and remain at undetectable levels after radical prostatectomy. The AUA defines biochemical recurrence as an initial PSA value 0.2 ng/mL or greater followed by a subsequent confirmatory PSA value 0.2 ng/mL or greater. Values obtained with different assay methods or kits cannot be used interchangeably. Results cannot be interpreted as absolute evidence of the presence or absence of malignant disease.   Lactic acid, plasma     Status: None   Collection Time: 03/26/18 11:44 PM  Result Value Ref Range   Lactic Acid, Venous 1.4 0.5 - 1.9 mmol/L    Comment: Performed at [facility] [address], [place], [place] [address]  Procalcitonin     Status: None   Collection Time: 03/26/18 11:44 PM  Result Value Ref Range   Procalcitonin <0.10 ng/mL    Comment:        Interpretation: PCT (Procalcitonin) <= 0.5 ng/mL: Systemic infection (sepsis) is not likely. Local bacterial infection is possible. (NOTE)       Sepsis PCT Algorithm           Lower Respiratory Tract                                      Infection PCT Algorithm    ----------------------------     ----------------------------         PCT < 0.25 ng/mL                PCT < 0.10 ng/mL         Strongly encourage             Strongly discourage   discontinuation of antibiotics    initiation of antibiotics    ----------------------------     -----------------------------       PCT 0.25 - 0.50 ng/mL            PCT 0.10 - 0.25 ng/mL               OR       >80% decrease in PCT            Discourage initiation of                                            antibiotics      Encourage discontinuation           of antibiotics    ----------------------------     -----------------------------         PCT >= 0.50 ng/mL              PCT 0.26 - 0.50 ng/mL  AND        <80% decrease in PCT             Encourage initiation of                                             antibiotics       Encourage continuation            of antibiotics    ----------------------------     -----------------------------        PCT >= 0.50 ng/mL                  PCT > 0.50 ng/mL               AND         increase in PCT                  Strongly encourage                                      initiation of antibiotics    Strongly encourage escalation           of antibiotics                                     -----------------------------                                           PCT <= 0.25 ng/mL                                                 OR                                        > 80% decrease in PCT                                     Discontinue / Do not initiate                                             antibiotics Performed at [facility], [place] [address] [place], [place] [address]   TSH     Status: None   Collection Time: 03/26/18 11:44 PM  Result Value Ref Range   TSH 1.972 0.350 - 4.500 uIU/mL    Comment: Performed by a 3rd Generation assay with a functional sensitivity of <=0.01 uIU/mL. Performed at [facility], [place] [address], [place], [place] [address]   Magnesium     Status: Abnormal   Collection Time: 03/26/18 11:44 PM  Result Value Ref Range   Magnesium 1.6 (L) 1.7 - 2.4  mg/dL    Comment: Performed at [facility], [place] [address] [place], [place] [address]  Troponin I     Status: Abnormal   Collection Time: 03/26/18 11:44 PM  Result Value Ref Range   Troponin I 0.08 (HH) <0.03 ng/mL    Comment: CRITICAL RESULT CALLED TO, READ BACK BY AND VERIFIED WITH: T.NEILSON,RN 0138 03/27/18 G.MCADOO Performed at [facility], [place] [address], [place], [place] [address]   C-reactive protein     Status: Abnormal   Collection Time: 03/26/18 11:44 PM  Result Value Ref Range   CRP 2.9 (H) <1.0 mg/dL    Comment: Performed at [facility] [address] [place], [place] [address]  Sedimentation rate     Status: Abnormal   Collection Time: 03/26/18 11:44 PM  Result  Value Ref Range   Sed Rate 46 (H) 0 - 16 mm/hr    Comment: Performed at [facility] [address], [place], [place] [address]  MRSA PCR Screening     Status: None   Collection Time: 03/27/18 12:59 AM  Result Value Ref Range   MRSA by PCR NEGATIVE NEGATIVE    Comment:        The GeneXpert MRSA Assay (FDA approved for NASAL specimens only), is one component of a comprehensive MRSA colonization surveillance program. It is not intended to diagnose MRSA infection nor to guide or monitor treatment for MRSA infections. Performed at [facility], [place] [address] [place], [place] [address]   CBC     Status: Abnormal   Collection Time: 03/27/18  5:25 AM  Result Value Ref Range   WBC 5.2 4.0 - 10.5 K/uL   RBC 3.73 (L) 4.22 - 5.81 MIL/uL   Hemoglobin 11.7 (L) 13.0 - 17.0 g/dL   HCT 36.2 (L) 39.0 - 52.0 %   MCV 97.1 78.0 - 100.0 fL   MCH 31.4 26.0 - 34.0 pg   MCHC 32.3 30.0 - 36.0 g/dL   RDW 12.3 11.5 - 15.5 %   Platelets 178 150 - 400 K/uL    Comment: Performed at [facility], [place] [address] [place], [place] [address]  Basic metabolic panel     Status: Abnormal   Collection Time: 03/27/18  5:25 AM  Result Value Ref Range   Sodium 134 (L) 135 - 145 mmol/L   Potassium 4.0 3.5 - 5.1 mmol/L   Chloride 101 98 - 111 mmol/L    Comment: Please note change in reference range.   CO2 25 22 - 32 mmol/L   Glucose, Bld 72 70 - 99 mg/dL    Comment: Please note change in reference range.   BUN 19 8 - 23 mg/dL    Comment: Please note change in reference range.   Creatinine, Ser 1.41 (H) 0.61 - 1.24 mg/dL   Calcium 8.5 (L) 8.9 - 10.3 mg/dL   GFR calc non Af Amer 43 (L) >60 mL/min   GFR calc Af Amer 50 (L) >60 mL/min    Comment: (NOTE) The eGFR has been calculated using the CKD EPI equation. This calculation has not been validated in all clinical situations. eGFR's persistently <60 mL/min signify possible Chronic Kidney Disease.    Anion gap 8 5 - 15    Comment:  Performed at [facility] [address], [place],  [address]  Troponin I     Status: Abnormal   Collection Time: 03/27/18  5:25 AM  Result  Value Ref Range   Troponin I 0.09 (HH) <0.03 ng/mL    Comment: CRITICAL VALUE NOTED.  VALUE IS CONSISTENT WITH PREVIOUSLY REPORTED AND CALLED VALUE. Performed at [facility], [place] [address], [place], [place] [address]   Troponin I     Status: Abnormal   Collection Time: 03/27/18  9:28 AM  Result Value Ref Range   Troponin I 0.07 (HH) <0.03 ng/mL    Comment: CRITICAL VALUE NOTED.  VALUE IS CONSISTENT WITH PREVIOUSLY REPORTED AND CALLED VALUE. Performed at [facility], [place] [address] [place], [place] [address]   Magnesium     Status: None   Collection Time: 03/27/18  9:28 AM  Result Value Ref Range   Magnesium 2.1 1.7 - 2.4 mg/dL    Comment: Performed at [facility], [place] [address] [place], [place] [address]  CBC     Status: Abnormal   Collection Time: 03/28/18  8:41 AM  Result Value Ref Range   WBC 5.6 4.0 - 10.5 K/uL   RBC 3.85 (L) 4.22 - 5.81 MIL/uL   Hemoglobin 12.3 (L) 13.0 - 17.0 g/dL   HCT 38.1 (L) 39.0 - 52.0 %   MCV 99.0 78.0 - 100.0 fL   MCH 31.9 26.0 - 34.0 pg   MCHC 32.3 30.0 - 36.0 g/dL   RDW 12.6 11.5 - 15.5 %   Platelets 182 150 - 400 K/uL    Comment: Performed at [facility], [place] [address] [place], [place] [address]  Basic metabolic panel     Status: Abnormal   Collection Time: 03/28/18  9:01 AM  Result Value Ref Range   Sodium 135 135 - 145 mmol/L   Potassium 4.0 3.5 - 5.1 mmol/L   Chloride 99 98 - 111 mmol/L    Comment: Please note change in reference range.   CO2 26 22 - 32 mmol/L   Glucose, Bld 112 (H) 70 - 99 mg/dL    Comment: Please note change in reference range.   BUN 16 8 - 23 mg/dL    Comment: Please note change in reference range.   Creatinine, Ser 1.63 (H) 0.61 - 1.24 mg/dL   Calcium 8.5 (L) 8.9 - 10.3 mg/dL   GFR calc non Af Amer 36 (L) >60 mL/min   GFR calc  Af Amer 42 (L) >60 mL/min    Comment: (NOTE) The eGFR has been calculated using the CKD EPI equation. This calculation has not been validated in all clinical situations. eGFR's persistently <60 mL/min signify possible Chronic Kidney Disease.    Anion gap 10 5 - 15    Comment: Performed at [facility] [address], [place], [place] [address]     ROS:  A comprehensive review of systems was negative.  Physical Exam: Vitals:   03/28/18 1150 03/28/18 1224  BP: (!) 130/107 134/85  Pulse:    Resp: 18   Temp: 98.4 F (36.9 C)   SpO2:       General: Thin, talkative black male who is alert and in no acute distress HEENT: Pupils are equal round reactive to light, extraocular motions are intact, mucous membranes are moist Neck: No JVD Heart: Irregularly irregular Lungs: Mostly clear Abdomen: Soft, admits to very mild tenderness on the left.  This is the location of his kidney Extremities: No edema  Skin: Warm and dry Neuro: Alert and talkative, nonfocal Foley catheter in place with 600 cc dark appearing urine  Assessment/Plan: 84 year old black  male with long-standing healthy kidney transplant.  He presented to hospital with a multitude of symptoms including A. fib with RVR and hypotension.  He now has mild acute on chronic kidney failure 1.Renal-long-standing kidney transplant that is done well with last creatinine known to be 1.2.  Now with creatinine of 1.6 in the setting of hypotension on an ARB as well as contrast.  I suspect this is the reason for the acute kidney injury.  ARB has been held, heart rate and blood pressure stabilizing.  Nonoliguric and not uremic.  I suspect that if patient gets better his kidney function will too.  Primary to start some fluids at 75 an hour.  I am okay with this in the short-term 2. Hypertension/volume  -initially hypotensive on an ARB.  ARB has been held.  Blood pressure is reasonable on now beta-blocker and Cardizem 3 status post renal  transplant. -Continue home medications of cyclosporine, Myfortic and prednisone 4. Dispo-it appears patient may be approaching discharge.  I would not have issue with discontinuing Foley to bladder train him for discharge as I do not feel he was obstructed   Kaidyn Javid A 03/28/2018, 1:34 PM

## 2018-03-28 NOTE — Progress Notes (Signed)
PROGRESS NOTE    [name] [name]  ZOX:[id number] DOB: [birth year] DOA: 03/26/2018 PCP: Mauricia Area, MD    Brief Narrative: [name] [name] is a 84 y.o. male with medical history significant of HTN, anemia of chronic disease, CKD s/p renal transplant follow by Dr. Jimmy Footman of [facility]; who presents with complaints of not feeling well.  History is somewhat difficult to obtain from patient at this time.  He complains of having chills, malaise, urinary frequency, left flank pain, insomnia, nausea, and poor appetite.  Family report that the patient has had a significant amount of weight loss estimated to be up to 40 pounds in the last 6 months.  He denies having any cough, shortness of breath, emesis, or abdominal pain.  ED Course: Upon admission to the emergency department patient presents with temperature of 94.6 F improved to 98.7 F after being placed to bear hugger found in A. fib with heart rate up to 112, respirations 12-24, blood pressure as low as 80/59 improved to 143/106 with IV fluids.  Labs revealed normal CBC, sodium 132, BUN 19, creatinine 1.52, troponin 0.08, and lactic acid trending up to 2.72.  Urinalysis was negative for any acute abnormalities.  Chest x-ray showed no clear infiltrate, and scattered calcified pleural plaques consistent with prior space cysts exposure.  Patient was ordered a total of 1500 mL of normal saline IV fluids, given 15 mg of diltiazem IV,  and then started on diltiazem drip.  TRH called to admit.    Assessment & Plan:   Active Problems:   SIRS (systemic inflammatory response syndrome) (HCC)   Bone lesion   AAA (abdominal aortic aneurysm) (HCC)   BPH (benign prostatic hyperplasia)   Weight loss   Hypotension   Unspecified atrial fibrillation (HCC)   Protein-calorie malnutrition, severe   1-A fib RVR;  He was initially on  Cardizem gtt. Transition to oral. HR up today. IV metoprolol ordered. Cardiology will adjust further Cardizem doses.    Cardiology consulted.  Troponin mild elevated.  ECHO normal EF.   2-Acute on CKD III S/P Left kidney transplant secondary to HTN and FSG;  Renal function improved with fluids.  Continue with immunosuppressive therapy.  Cr on admission cr at 1.5---1.4 increase today to 1.6.  Cr at 1.2 September  Nephrology consultation.  Strict I and O/ bladder scan.  He received contrast.  Continue with IV fluids.   3-Weight loss; Severe malnutrition in the context of chronic illness 40 pounds---needs further work up out patient by PCP. Might need endoscopy./  sclerotic bone lesion, PSA normal. Needs follow up out patient , discuss with oncologist.  Report dysphagia; inability to chew food. Change diet to dysphagia. Esophagogram with dysmotility. Started pepcid, PPI interact with mycophenolate.  CRP 2.9.  pro calcitonin 0.1, ESR 46.  Ensure.   4-Hypomagnesemia; replete IV.  Repeat labs in am.   5-Evidence of prior asbestos exposure with multiple bilateral calcified pleural plaques. Associated significant chronic lung disease with advanced cystic changes at the lung bases bilaterally. - he will need pulmonologist follow up. Reviewed CT with pulmonologist on called.   Possible mild wall thickening involving the distal stomach and/or proximal duodenum;  Started Pepcid.   SIRS;  Patient presents hypothermic, hypotensive, tachycardic, and tachypneic.  Patient's initial lactic acid was elevated to 2.72.  Urinalysis did not show any signs of infection and patient reported no respiratory symptoms. -continue with IV antibiotics for now. Stop vancomycin.  -Follow blood cultures. No growth in 24 hours.  -UA negative,  -  TSH normal.   DVT prophylaxis: lovenox Code Status:  Full code.  Family Communication: Son at bedside. Updated regarding needs for follow up with pulmonologist and PCP for weight loss, might need also GI evaluation.  Disposition Plan: home when stable.    Consultants:   Cardiology     Procedures:      Antimicrobials:  Zosyn 7-16 Vancomycin 7-16  Subjective: He is feeling better.  He report trauma to his chest 2 years ago while mowing  the lawn. Hit ribs. He has had pain since them.  Eating more.    Objective: Vitals:   03/28/18 0500 03/28/18 0600 03/28/18 0704 03/28/18 0819  BP: 115/87 (!) 124/91 (!) 124/91 116/84  Pulse:      Resp:    18  Temp:    97.7 F (36.5 C)  TempSrc:    Oral  SpO2: 99% 99%  100%  Weight:  61 kg (134 lb 7.7 oz)    Height:        Intake/Output Summary (Last 24 hours) at 03/28/2018 0835 Last data filed at 03/27/2018 2334 Gross per 24 hour  Intake 844 ml  Output 500 ml  Net 344 ml   Filed Weights   03/27/18 0100 03/28/18 0600  Weight: 60.4 kg (133 lb 2.5 oz) 61 kg (134 lb 7.7 oz)    Examination:  General exam: NAD Respiratory system: CTA Cardiovascular system: S 1, S 2 RRR Gastrointestinal system: BS present, soft, nt Central nervous system: alert, non focal.  Extremities: Symmetric power.  Skin: No rash, or lesions.     Data Reviewed: I have personally reviewed following labs and imaging studies  CBC: Recent Labs  Lab 03/26/18 1616 03/27/18 0525  WBC 4.3 5.2  NEUTROABS 2.6  --   HGB 13.3 11.7*  HCT 42.4 36.2*  MCV 100.2* 97.1  PLT 195 741   Basic Metabolic Panel: Recent Labs  Lab 03/26/18 1616 03/26/18 2344 03/27/18 0525 03/27/18 0928  NA 132*  --  134*  --   K 4.1  --  4.0  --   CL 96*  --  101  --   CO2 27  --  25  --   GLUCOSE 90  --  72  --   BUN 19  --  19  --   CREATININE 1.52*  --  1.41*  --   CALCIUM 8.8*  --  8.5*  --   MG  --  1.6*  --  2.1   GFR: Estimated Creatinine Clearance: 31.8 mL/min (A) (by C-G formula based on SCr of 1.41 mg/dL (H)). Liver Function Tests: Recent Labs  Lab 03/26/18 1616  AST 24  ALT 11  ALKPHOS 71  BILITOT 0.7  PROT 7.6  ALBUMIN 2.9*   Recent Labs  Lab 03/26/18 1753  LIPASE 35   No results for input(s): AMMONIA in the last 168  hours. Coagulation Profile: Recent Labs  Lab 03/26/18 1616  INR 1.16   Cardiac Enzymes: Recent Labs  Lab 03/26/18 2344 03/27/18 0525 03/27/18 0928  TROPONINI 0.08* 0.09* 0.07*   BNP (last 3 results) No results for input(s): PROBNP in the last 8760 hours. HbA1C: No results for input(s): HGBA1C in the last 72 hours. CBG: No results for input(s): GLUCAP in the last 168 hours. Lipid Profile: No results for input(s): CHOL, HDL, LDLCALC, TRIG, CHOLHDL, LDLDIRECT in the last 72 hours. Thyroid Function Tests: Recent Labs    03/26/18 2344  TSH 1.972   Anemia Panel: No results for  input(s): VITAMINB12, FOLATE, FERRITIN, TIBC, IRON, RETICCTPCT in the last 72 hours. Sepsis Labs: Recent Labs  Lab 03/26/18 1638 03/26/18 1809 03/26/18 2344  PROCALCITON  --   --  <0.10  LATICACIDVEN 2.49* 2.72* 1.4    Recent Results (from the past 240 hour(s))  Culture, blood (Routine x 2)     Status: None (Preliminary result)   Collection Time: 03/26/18  4:00 PM  Result Value Ref Range Status   Specimen Description BLOOD RIGHT ARM  Final   Special Requests   Final    BOTTLES DRAWN AEROBIC AND ANAEROBIC Blood Culture results may not be optimal due to an inadequate volume of blood received in culture bottles   Culture   Final    NO GROWTH < 24 HOURS Performed at [facility] [address], [place], [place] [address]    Report Status PENDING  Incomplete  Culture, blood (Routine x 2)     Status: None (Preliminary result)   Collection Time: 03/26/18  4:16 PM  Result Value Ref Range Status   Specimen Description BLOOD LEFT ARM  Final   Special Requests   Final    BOTTLES DRAWN AEROBIC AND ANAEROBIC Blood Culture adequate volume   Culture   Final    NO GROWTH < 24 HOURS Performed at [facility], [place] [address], [place], [place] [address]    Report Status PENDING  Incomplete  MRSA PCR Screening     Status: None   Collection Time: 03/27/18 12:59 AM  Result Value Ref Range  Status   MRSA by PCR NEGATIVE NEGATIVE Final    Comment:        The GeneXpert MRSA Assay (FDA approved for NASAL specimens only), is one component of a comprehensive MRSA colonization surveillance program. It is not intended to diagnose MRSA infection nor to guide or monitor treatment for MRSA infections. Performed at [facility], [place] [address], [place], [place] [address]          Radiology Studies: Dg Chest 2 View  Result Date: 03/26/2018 CLINICAL DATA:  Hypotension. EXAM: CHEST - 2 VIEW COMPARISON:  CT abdomen and pelvis dated August 30, 2005. FINDINGS: The heart size and mediastinal contours are within normal limits. Normal pulmonary vascularity. Stable cystic changes at the left greater than right lung base. No focal consolidation, pleural effusion, or pneumothorax. Scattered calcified pleural plaques. No acute osseous abnormality. IMPRESSION: 1.  No active cardiopulmonary disease. 2. Scattered calcified pleural plaques, consistent with prior space cyst exposure. Electronically Signed   By: Titus Dubin M.D.   On: 03/26/2018 17:00   Ct Chest W Contrast  Result Date: 03/26/2018 CLINICAL DATA:  Failure to thrive, decreased appetite, weight loss, fatigue. EXAM: CT CHEST, ABDOMEN, AND PELVIS WITH CONTRAST TECHNIQUE: Multidetector CT imaging of the chest, abdomen and pelvis was performed following the standard protocol during bolus administration of intravenous contrast. CONTRAST:  120m OMNIPAQUE IOHEXOL 300 MG/ML  SOLN COMPARISON:  CT of the abdomen and pelvis without contrast on 09/01/2005 FINDINGS: CT CHEST FINDINGS Cardiovascular: The aortic root measures approximately 4.4 cm. There is mild dilatation of the ascending thoracic aorta measuring 4.3 cm. The aortic arch is also mildly dilated at 3.8 cm and the descending thoracic aorta measures 3 cm. No evidence of aortic dissection. Proximal great vessels are normally patent. Right-sided heart chambers are enlarged. There is  prominent reflux of contrast into the intrahepatic IVC and diffusely throughout the hepatic veins. Findings are consistent with right heart  failure and elevated right heart pressures. No pericardial effusion. Scattered calcified plaque is noted in the coronary arteries. Central pulmonary arteries are normal in caliber. Mediastinum/Nodes: No enlarged mediastinal, hilar, or axillary lymph nodes. Thyroid gland, trachea, and esophagus demonstrate no significant findings. Lungs/Pleura: Multiple calcified pleural plaques are present bilaterally consistent with asbestos exposure. There is significant emphysematous lung disease and cystic disease at the bases. Upper lung zones bilaterally as well as the superior segment of the left lower lobe demonstrate mild patchy areas of ground-glass opacity. This is felt to most likely be on the basis of mild pulmonary edema. No focal pulmonary masses. No pneumothorax. No pleural fluid identified. Musculoskeletal: There are some focal dense sclerotic areas in the ribs including the right anterior third, right fourth, left first, left third and left fifth ribs. No other bony lesions identified in the chest. Vertebral bodies show no evidence of fracture. CT ABDOMEN PELVIS FINDINGS Hepatobiliary: No focal liver abnormality is seen. No gallstones, gallbladder wall thickening, or biliary dilatation. Pancreas: There is diffuse dilatation of the pancreatic duct up to approximately 5 mm. The duct appears smooth and regular with no side branch dilatation. No evidence of visible mass, significant pancreatic atrophy or ductal calculi. No evidence of overt pancreatitis or surrounding fluid collections. Spleen: Normal in size without focal abnormality. Adrenals/Urinary Tract: Status post prior bilateral nephrectomy. A left pelvic transplant kidney is present. This demonstrates no hydronephrosis. Delayed imaging shows excretion of contrast by the transplant kidney and flow of contrast into the  transplant ureter and bladder. No adrenal masses. Stomach/Bowel: Bowel shows no evidence of obstruction. There may be some mild wall thickening involving the distal stomach and/or proximal duodenum. No evidence of free air. Diffuse diverticulosis present of the sigmoid colon without evidence of diverticulitis. Vascular/Lymphatic: Atherosclerosis of the distal aorta and iliac arteries without evidence of aneurysmal disease. Reproductive: Mild prostate enlargement. Other: No abdominal wall hernia or abnormality. No abdominopelvic ascites. Musculoskeletal: Area of sclerosis in the right superior pubic bone. Mild loss of height of the L3 and L4 vertebral bodies with superior endplate compression. No other bony lesions identified. IMPRESSION: 1. Mild aneurysmal disease of the ascending thoracic aorta measuring 4.3 cm in greatest diameter. No dissection. Recommend annual imaging followup by CTA or MRA. This recommendation follows 2010 ACCF/AHA/AATS/ACR/ASA/SCA/SCAI/SIR/STS/SVM Guidelines for the Diagnosis and Management of Patients with Thoracic Aortic Disease. Circulation. 2010; 121: Y195-K932 2. Evidence of significant right heart failure. Probable early alveolar edema in the upper lung zones bilaterally. 3. Sclerotic foci in several bilateral ribs as well as the right superior pubic bone. These are fairly well-circumscribed and very dense. These may all represent benign bone islands. Sclerotic bony metastatic disease cannot be entirely excluded and correlation with PSA is recommended. 4. Evidence of prior asbestos exposure with multiple bilateral calcified pleural plaques. Associated significant chronic lung disease with advanced cystic changes at the lung bases bilaterally. 5. Unexplained diffuse dilatation of the pancreatic duct without obvious focal mass. This may be on the basis of prior pancreatitis. 6. 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 7. Possible mild wall  thickening involving the distal stomach and/or proximal duodenum. This may be on the basis of peptic disease. Electronically Signed   By: Aletta Edouard M.D.   On: 03/26/2018 21:02   Ct Abdomen Pelvis W Contrast  Result Date: 03/26/2018 CLINICAL DATA:  Failure to thrive, decreased appetite, weight loss, fatigue. EXAM: CT CHEST, ABDOMEN, AND PELVIS WITH CONTRAST TECHNIQUE: Multidetector CT imaging of the  chest, abdomen and pelvis was performed following the standard protocol during bolus administration of intravenous contrast. CONTRAST:  134m OMNIPAQUE IOHEXOL 300 MG/ML  SOLN COMPARISON:  CT of the abdomen and pelvis without contrast on 09/01/2005 FINDINGS: CT CHEST FINDINGS Cardiovascular: The aortic root measures approximately 4.4 cm. There is mild dilatation of the ascending thoracic aorta measuring 4.3 cm. The aortic arch is also mildly dilated at 3.8 cm and the descending thoracic aorta measures 3 cm. No evidence of aortic dissection. Proximal great vessels are normally patent. Right-sided heart chambers are enlarged. There is prominent reflux of contrast into the intrahepatic IVC and diffusely throughout the hepatic veins. Findings are consistent with right heart failure and elevated right heart pressures. No pericardial effusion. Scattered calcified plaque is noted in the coronary arteries. Central pulmonary arteries are normal in caliber. Mediastinum/Nodes: No enlarged mediastinal, hilar, or axillary lymph nodes. Thyroid gland, trachea, and esophagus demonstrate no significant findings. Lungs/Pleura: Multiple calcified pleural plaques are present bilaterally consistent with asbestos exposure. There is significant emphysematous lung disease and cystic disease at the bases. Upper lung zones bilaterally as well as the superior segment of the left lower lobe demonstrate mild patchy areas of ground-glass opacity. This is felt to most likely be on the basis of mild pulmonary edema. No focal pulmonary masses.  No pneumothorax. No pleural fluid identified. Musculoskeletal: There are some focal dense sclerotic areas in the ribs including the right anterior third, right fourth, left first, left third and left fifth ribs. No other bony lesions identified in the chest. Vertebral bodies show no evidence of fracture. CT ABDOMEN PELVIS FINDINGS Hepatobiliary: No focal liver abnormality is seen. No gallstones, gallbladder wall thickening, or biliary dilatation. Pancreas: There is diffuse dilatation of the pancreatic duct up to approximately 5 mm. The duct appears smooth and regular with no side branch dilatation. No evidence of visible mass, significant pancreatic atrophy or ductal calculi. No evidence of overt pancreatitis or surrounding fluid collections. Spleen: Normal in size without focal abnormality. Adrenals/Urinary Tract: Status post prior bilateral nephrectomy. A left pelvic transplant kidney is present. This demonstrates no hydronephrosis. Delayed imaging shows excretion of contrast by the transplant kidney and flow of contrast into the transplant ureter and bladder. No adrenal masses. Stomach/Bowel: Bowel shows no evidence of obstruction. There may be some mild wall thickening involving the distal stomach and/or proximal duodenum. No evidence of free air. Diffuse diverticulosis present of the sigmoid colon without evidence of diverticulitis. Vascular/Lymphatic: Atherosclerosis of the distal aorta and iliac arteries without evidence of aneurysmal disease. Reproductive: Mild prostate enlargement. Other: No abdominal wall hernia or abnormality. No abdominopelvic ascites. Musculoskeletal: Area of sclerosis in the right superior pubic bone. Mild loss of height of the L3 and L4 vertebral bodies with superior endplate compression. No other bony lesions identified. IMPRESSION: 1. Mild aneurysmal disease of the ascending thoracic aorta measuring 4.3 cm in greatest diameter. No dissection. Recommend annual imaging followup by CTA  or MRA. This recommendation follows 2010 ACCF/AHA/AATS/ACR/ASA/SCA/SCAI/SIR/STS/SVM Guidelines for the Diagnosis and Management of Patients with Thoracic Aortic Disease. Circulation. 2010; 121: [id number]. Evidence of significant right heart failure. Probable early alveolar edema in the upper lung zones bilaterally. 3. Sclerotic foci in several bilateral ribs as well as the right superior pubic bone. These are fairly well-circumscribed and very dense. These may all represent benign bone islands. Sclerotic bony metastatic disease cannot be entirely excluded and correlation with PSA is recommended. 4. Evidence of prior asbestos exposure with multiple bilateral calcified pleural plaques. Associated  significant chronic lung disease with advanced cystic changes at the lung bases bilaterally. 5. Unexplained diffuse dilatation of the pancreatic duct without obvious focal mass. This may be on the basis of prior pancreatitis. 6. 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 7. Possible mild wall thickening involving the distal stomach and/or proximal duodenum. This may be on the basis of peptic disease. Electronically Signed   By: Aletta Edouard M.D.   On: 03/26/2018 21:02   Dg Esophagus  Result Date: 03/27/2018 CLINICAL DATA:  Difficulty swallowing, dysphagia EXAM: ESOPHOGRAM/BARIUM SWALLOW TECHNIQUE: Single contrast examination was performed using  thin barium. FLUOROSCOPY TIME:  Fluoroscopy Time:  3 minutes 24 seconds Radiation Exposure Index (if provided by the fluoroscopic device): Number of Acquired Spot Images: 0 COMPARISON:  CT 03/26/2018 FINDINGS: Fluoroscopic evaluation of swallowing demonstrates disruption of primary esophageal peristaltic waves. There is no fixed stricture or fold thickening. Small hiatal hernia noted. Just above the hiatal hernia, there is a moderate-sized distal esophageal diverticulum. No reflux with the water siphon maneuver. The patient  swallowed a 13 mm barium tablet which sticks in the distal esophagus despite the lack of visible fixed stricture in the area. IMPRESSION: Esophageal dysmotility. Small hiatal hernia. Moderate-sized distal esophageal diverticulum. The 13 mm barium tablet sticks in the distal esophagus above the diverticulum despite lack of visible fixed stricture. Electronically Signed   By: Rolm Baptise M.D.   On: 03/27/2018 13:44   Dg Chest Port 1 View  Result Date: 03/27/2018 CLINICAL DATA:  84 year old male with sepsis. EXAM: PORTABLE CHEST 1 VIEW COMPARISON:  Chest CT dated 03/26/2018 FINDINGS: There is emphysematous changes of the lungs with interstitial coarsening. Small scattered hazy areas primarily in the left lung may be chronic although developing infiltrate is not excluded. Clinical correlation is recommended. There is no focal consolidation, pleural effusion, or pneumothorax. Small calcified pleural plaques noted. Mild cardiomegaly. The thoracic aorta is tortuous with atherosclerotic calcification. There is degenerative changes of the spine. No acute osseous pathology. IMPRESSION: 1. Emphysema.  No focal consolidation or pneumothorax. 2. Areas of hazy density may be chronic or represent developing infiltrate. Clinical correlation is recommended. Electronically Signed   By: Anner Crete M.D.   On: 03/27/2018 00:11        Scheduled Meds: . apixaban  2.5 mg Oral BID  . aspirin  81 mg Oral Daily  . cycloSPORINE modified  100 mg Oral BID   And  . cycloSPORINE modified  25 mg Oral BID  . diltiazem  30 mg Oral Q6H  . diltiazem  30 mg Oral Once  . feeding supplement (ENSURE ENLIVE)  237 mL Oral TID BM  . mycophenolate  180 mg Oral BID  . pantoprazole  40 mg Oral BID  . predniSONE  5 mg Oral Q breakfast  . sodium chloride flush  3 mL Intravenous Q12H   Continuous Infusions: . sodium chloride 10 mL/hr at 03/27/18 0004  . sodium chloride 10 mL/hr at 03/27/18 2252  . piperacillin-tazobactam (ZOSYN)   IV 3.375 g (03/28/18 0710)     LOS: 2 days    Time spent: 35 minutes.     Elmarie Shiley, MD [facility] Pager [phone]  If 7PM-7AM, please contact night-coverage [email or web address] Password [facility] 03/28/2018, 8:35 AM

## 2018-03-29 LAB — BASIC METABOLIC PANEL
Anion gap: 7 (ref 5–15)
BUN: 16 mg/dL (ref 8–23)
CALCIUM: 8.5 mg/dL — AB (ref 8.9–10.3)
CO2: 26 mmol/L (ref 22–32)
CREATININE: 1.54 mg/dL — AB (ref 0.61–1.24)
Chloride: 103 mmol/L (ref 98–111)
GFR calc non Af Amer: 39 mL/min — ABNORMAL LOW (ref >60)
GFR, EST AFRICAN AMERICAN: 45 mL/min — AB (ref >60)
Glucose, Bld: 99 mg/dL (ref 70–99)
Potassium: 4.1 mmol/L (ref 3.5–5.1)
Sodium: 136 mmol/L (ref 135–145)

## 2018-03-29 LAB — CBC
HEMATOCRIT: 36.9 % — AB (ref 39.0–52.0)
Hemoglobin: 11.8 g/dL — ABNORMAL LOW (ref 13.0–17.0)
MCH: 31.3 pg (ref 26.0–34.0)
MCHC: 32 g/dL (ref 30.0–36.0)
MCV: 97.9 fL (ref 78.0–100.0)
PLATELETS: 179 10*3/uL (ref 150–400)
RBC: 3.77 MIL/uL — ABNORMAL LOW (ref 4.22–5.81)
RDW: 12.5 % (ref 11.5–15.5)
WBC: 6.2 10*3/uL (ref 4.0–10.5)

## 2018-03-29 LAB — MAGNESIUM: Magnesium: 1.7 mg/dL (ref 1.7–2.4)

## 2018-03-29 MED ORDER — ENSURE ENLIVE PO LIQD: 237.0000 mL | Freq: Three times a day (TID) | ORAL | 12 refills | Status: AC

## 2018-03-29 MED ORDER — ASPIRIN 81 MG PO CHEW: 81.0000 mg | CHEWABLE_TABLET | Freq: Every day | ORAL | 0 refills | Status: AC

## 2018-03-29 MED ORDER — APIXABAN 2.5 MG PO TABS: 2.5000 mg | ORAL_TABLET | Freq: Two times a day (BID) | ORAL | 0 refills | Status: DC

## 2018-03-29 MED ORDER — AMOXICILLIN-POT CLAVULANATE 875-125 MG PO TABS: 1.0000 | ORAL_TABLET | Freq: Two times a day (BID) | ORAL | 0 refills | Status: AC

## 2018-03-29 MED ORDER — DILTIAZEM HCL ER COATED BEADS 180 MG PO CP24
180.0000 mg | ORAL_CAPSULE | Freq: Every day | ORAL | Status: DC
  Administered 2018-03-29: 180 mg via ORAL
  Filled 2018-03-29: qty 1

## 2018-03-29 MED ORDER — MAGNESIUM SULFATE 2 GM/50ML IV SOLN
2.0000 g | Freq: Once | INTRAVENOUS | Status: AC
  Administered 2018-03-29: 2 g via INTRAVENOUS
  Filled 2018-03-29: qty 50

## 2018-03-29 MED ORDER — FAMOTIDINE 20 MG PO TABS: 20.0000 mg | ORAL_TABLET | Freq: Every day | ORAL | 0 refills | Status: AC

## 2018-03-29 MED ORDER — HYDRALAZINE HCL 20 MG/ML IJ SOLN: 10.0000 mg | Freq: Three times a day (TID) | INTRAMUSCULAR | Status: DC | PRN

## 2018-03-29 MED ORDER — DILTIAZEM HCL ER COATED BEADS 180 MG PO CP24: 180.0000 mg | ORAL_CAPSULE | Freq: Every day | ORAL | 0 refills | Status: DC

## 2018-03-29 NOTE — Discharge Summary (Addendum)
Physician Discharge Summary  [name] [name] JJH:[id number] DOB: 1928/09/05 DOA: 03/26/2018  PCP: Mauricia Area, MD  Admit date: 03/26/2018 Discharge date: 03/29/2018  Admitted From: Home  Disposition:  Home   Recommendations for Outpatient Follow-up:  1. Follow up with PCP in 1-2 weeks 2. Please obtain BMP/CBC in one week 3. Needs follow up with pulmonologist for chronic cystic changes lower lobe lungs.  4. Needs to follow up with PCP for further evaluation of bone lesion, weight loss and gastritis. Might need referral to gastroenterologist.  5. Follow up with cardiologist for further management of A fib.     Discharge Condition: stable.  CODE STATUS: full code.  Diet recommendation: Heart Healthy   Brief/Interim Summary: Brief Narrative: [name] [name] a 84 y.o.malewith medical history significant of HTN, anemia of chronic disease, CKD s/p renal transplant follow by Dr. Jimmy [name] of [facility]; who presents with complaints of not feeling well.History is somewhat difficult to obtain from patient at this time. He complains of having chills, malaise, urinary frequency, left flank pain, insomnia, nausea, and poor appetite. Family report that the patient has had a significant amount of weight loss estimated to be up to 40 pounds in the last 6 months. He denies having any cough, shortness of breath, emesis, or abdominal pain.  ED Course:Upon admission to the emergency department patient presents with temperature of 94.6 F improved to 98.7 F after being placed to bear hugger found in A. fib with heart rate up to 112, respirations 12-24, blood pressure as low as 80/59 improved to 143/106 with IV fluids. Labs revealed normal CBC, sodium 132, BUN 19, creatinine 1.52, troponin 0.08, and lactic acid trending up to 2.72. Urinalysis was negative for any acute abnormalities. Chest x-ray showed no clear infiltrate,and scattered calcified pleural plaques consistent with prior space cysts  exposure. Patient was ordered a total of 1500 mL of normal saline IV fluids, given 15 mg of diltiazem IV, and then started on diltiazem drip. TRH called to admit.    Assessment & Plan:   Active Problems:   SIRS (systemic inflammatory response syndrome) (HCC)   Bone lesion   AAA (abdominal aortic aneurysm) (HCC)   BPH (benign prostatic hyperplasia)   Weight loss   Hypotension   Unspecified atrial fibrillation (HCC)   Protein-calorie malnutrition, severe   1-A fib RVR;  He was initially on  Cardizem gtt. Transition to oral. HR up today. IV metoprolol ordered. Cardiology will adjust further Cardizem doses.  Cardiology consulted.  Troponin mild elevated.  ECHO normal EF.  better controlled. He will be discharge on Cardizem 180 mg  Started on eliquis.   2-Acute on CKD III S/P Left kidney transplant secondary to HTN and FSG;  Renal function improved with fluids.  Continue with immunosuppressive therapy.  Cr on admission cr at 1.5---1.4 increase today to 1.6.  Cr at 1.2 September  Nephrology consultation.  He received contrast.  Received IV fluid for 24 hours.  Cr decreased to 1.5. Discussed with DR Florene Glen, ok to discharge home with close follow up for renal function.  removed foley catheter. Will make sure he is able to urinate prior to discharge.    3-Weight loss; Severe malnutrition in the context of chronic illness 40 pounds---needs further work up out patient by PCP. Might need endoscopy./  sclerotic bone lesion, PSA normal. Needs follow up out patient , discuss with oncologist.  Report dysphagia; inability to chew food. Change diet to dysphagia. Esophagogram with dysmotility. Started pepcid, PPI interact with mycophenolate.  CRP 2.9.  pro calcitonin 0.1, ESR 46.  Ensure.  he has been eating better. Continue with ensure.   4-Hypomagnesemia; replete IV.    5-Evidence of prior asbestos exposure with multiple bilateral calcified pleural plaques. Associated  significant chronic lung disease with advanced cystic changes at the lung bases bilaterally. - he will need pulmonologist follow up. Reviewed CT with pulmonologist on called.   Possible mild wall thickening involving the distal stomach and/or proximal duodenum;  Started Pepcid.  Needs follow up with gastroenterologist.   Esophageal dysmotility;  Started on pepcid.  Hopefully Cardizem which was started for A fib, will help with dysmotility.   SIRS;  Patient presents hypothermic, hypotensive, tachycardic, and tachypneic. Patient's initial lactic acid was elevated to 2.72. Urinalysis did not show any signs of infection and patient reported no respiratory symptoms. -continue with IV antibiotics for now. Stop vancomycin.  -Follow blood cultures. No growth in 24 hours.  -UA negative,  -TSH normal.  -unclear source of infection, improved of zosyn. He received IV antibiotics for 3 days. He will be discharge on Augmentin for 2 days.   Discharge Diagnoses:  Active Problems:   SIRS (systemic inflammatory response syndrome) (HCC)   Bone lesion   AAA (abdominal aortic aneurysm) (HCC)   BPH (benign prostatic hyperplasia)   Weight loss   Hypotension   Unspecified atrial fibrillation (HCC)   Protein-calorie malnutrition, severe    Discharge Instructions  Discharge Instructions    Diet - low sodium heart healthy   Complete by:  As directed    Increase activity slowly   Complete by:  As directed      Allergies as of 03/29/2018      Reactions   Ciprofloxacin Rash   Clindamycin/lincomycin Rash   Levaquin [levofloxacin] Rash   Septra [sulfamethoxazole-trimethoprim] Rash      Medication List    STOP taking these medications   aspirin 81 MG tablet Replaced by:  aspirin 81 MG chewable tablet   aspirin EC 81 MG tablet   atorvastatin 40 MG tablet Commonly known as:  LIPITOR   losartan 100 MG tablet Commonly known as:  COZAAR     TAKE these medications    amoxicillin-clavulanate 875-125 MG tablet Commonly known as:  AUGMENTIN Take 1 tablet by mouth 2 (two) times daily for 4 days.   apixaban 2.5 MG Tabs tablet Commonly known as:  ELIQUIS Take 1 tablet (2.5 mg total) by mouth 2 (two) times daily.   aspirin 81 MG chewable tablet Chew 1 tablet (81 mg total) by mouth daily. Start taking on:  03/30/2018 Replaces:  aspirin 81 MG tablet   cycloSPORINE modified 25 MG capsule Commonly known as:  NEORAL Take 25 mg by mouth 2 (two) times daily. 125 mg twice daily   cycloSPORINE modified 100 MG capsule Commonly known as:  NEORAL Take 100 mg by mouth 2 (two) times daily. 125 mg twice daily   diltiazem 180 MG 24 hr capsule Commonly known as:  CARDIZEM CD Take 1 capsule (180 mg total) by mouth daily. Start taking on:  03/30/2018   docusate sodium 100 MG capsule Commonly known as:  COLACE Take 200 mg by mouth daily as needed for mild constipation.   famotidine 20 MG tablet Commonly known as:  PEPCID Take 1 tablet (20 mg total) by mouth at bedtime.   feeding supplement (ENSURE ENLIVE) Liqd Take 237 mLs by mouth 3 (three) times daily between meals.   HYDROcodone-acetaminophen 5-325 MG tablet Commonly known as:  NORCO Take  1 tablet by mouth every 6 (six) hours as needed for moderate pain.   mycophenolate 180 MG EC tablet Commonly known as:  MYFORTIC Take 180 mg by mouth 2 (two) times daily.   predniSONE 5 MG tablet Commonly known as:  DELTASONE Take 5 mg by mouth daily with breakfast.            Durable Medical Equipment  (From admission, onward)        Start     Ordered   03/29/18 1451  For home use only DME Bedside commode  Once    Question:  Patient needs a bedside commode to treat with the following condition  Answer:  Activities involving personal bathing and showering   03/29/18 1451   03/29/18 1404  For home use only DME 4 wheeled rolling walker with seat  Once    Question:  Patient needs a walker to treat with the  following condition  Answer:  Weakness   03/29/18 1403     Follow-up Information    Deterding, Jeneen Rinks, MD Follow up.   Specialty:  Nephrology Why:  you need lab work next week.  Contact information: [place] [address] [phone]        [facility] Follow up in 2 week(s).   Specialty:  Pulmonology Contact information: [place]       Pixie Casino, MD Follow up in 2 week(s).   Specialty:  Cardiology Contact information: [place] [address] [place] Follow up.   Why:  rollator will be brought up to room, [facility] Contact information: [place] [address] [phone]          Allergies  Allergen Reactions  . Ciprofloxacin Rash  . Clindamycin/Lincomycin Rash  . Levaquin [Levofloxacin] Rash  . Septra [Sulfamethoxazole-Trimethoprim] Rash    Consultations:  Cardiology   Nephrology    Procedures/Studies: Dg Chest 2 View  Result Date: 03/26/2018 CLINICAL DATA:  Hypotension. EXAM: CHEST - 2 VIEW COMPARISON:  CT abdomen and pelvis dated August 30, 2005. FINDINGS: The heart size and mediastinal contours are within normal limits. Normal pulmonary vascularity. Stable cystic changes at the left greater than right lung base. No focal consolidation, pleural effusion, or pneumothorax. Scattered calcified pleural plaques. No acute osseous abnormality. IMPRESSION: 1.  No active cardiopulmonary disease. 2. Scattered calcified pleural plaques, consistent with prior space cyst exposure. Electronically Signed   By: Titus Dubin M.D.   On: 03/26/2018 17:00   Ct Chest W Contrast  Result Date: 03/26/2018 CLINICAL DATA:  Failure to thrive, decreased appetite, weight loss, fatigue. EXAM: CT CHEST, ABDOMEN, AND PELVIS WITH CONTRAST TECHNIQUE: Multidetector CT imaging of the chest, abdomen and pelvis  was performed following the standard protocol during bolus administration of intravenous contrast. CONTRAST:  141m OMNIPAQUE IOHEXOL 300 MG/ML  SOLN COMPARISON:  CT of the abdomen and pelvis without contrast on 09/01/2005 FINDINGS: CT CHEST FINDINGS Cardiovascular: The aortic root measures approximately 4.4 cm. There is mild dilatation of the ascending thoracic aorta measuring 4.3 cm. The aortic arch is also mildly dilated at 3.8 cm and the descending thoracic aorta measures 3 cm. No evidence of aortic dissection. Proximal great vessels are normally patent. Right-sided heart chambers are enlarged. There is prominent reflux of contrast into the intrahepatic IVC and diffusely throughout the hepatic veins. Findings are consistent with right  heart failure and elevated right heart pressures. No pericardial effusion. Scattered calcified plaque is noted in the coronary arteries. Central pulmonary arteries are normal in caliber. Mediastinum/Nodes: No enlarged mediastinal, hilar, or axillary lymph nodes. Thyroid gland, trachea, and esophagus demonstrate no significant findings. Lungs/Pleura: Multiple calcified pleural plaques are present bilaterally consistent with asbestos exposure. There is significant emphysematous lung disease and cystic disease at the bases. Upper lung zones bilaterally as well as the superior segment of the left lower lobe demonstrate mild patchy areas of ground-glass opacity. This is felt to most likely be on the basis of mild pulmonary edema. No focal pulmonary masses. No pneumothorax. No pleural fluid identified. Musculoskeletal: There are some focal dense sclerotic areas in the ribs including the right anterior third, right fourth, left first, left third and left fifth ribs. No other bony lesions identified in the chest. Vertebral bodies show no evidence of fracture. CT ABDOMEN PELVIS FINDINGS Hepatobiliary: No focal liver abnormality is seen. No gallstones, gallbladder wall thickening, or biliary  dilatation. Pancreas: There is diffuse dilatation of the pancreatic duct up to approximately 5 mm. The duct appears smooth and regular with no side branch dilatation. No evidence of visible mass, significant pancreatic atrophy or ductal calculi. No evidence of overt pancreatitis or surrounding fluid collections. Spleen: Normal in size without focal abnormality. Adrenals/Urinary Tract: Status post prior bilateral nephrectomy. A left pelvic transplant kidney is present. This demonstrates no hydronephrosis. Delayed imaging shows excretion of contrast by the transplant kidney and flow of contrast into the transplant ureter and bladder. No adrenal masses. Stomach/Bowel: Bowel shows no evidence of obstruction. There may be some mild wall thickening involving the distal stomach and/or proximal duodenum. No evidence of free air. Diffuse diverticulosis present of the sigmoid colon without evidence of diverticulitis. Vascular/Lymphatic: Atherosclerosis of the distal aorta and iliac arteries without evidence of aneurysmal disease. Reproductive: Mild prostate enlargement. Other: No abdominal wall hernia or abnormality. No abdominopelvic ascites. Musculoskeletal: Area of sclerosis in the right superior pubic bone. Mild loss of height of the L3 and L4 vertebral bodies with superior endplate compression. No other bony lesions identified. IMPRESSION: 1. Mild aneurysmal disease of the ascending thoracic aorta measuring 4.3 cm in greatest diameter. No dissection. Recommend annual imaging followup by CTA or MRA. This recommendation follows 2010 ACCF/AHA/AATS/ACR/ASA/SCA/SCAI/SIR/STS/SVM Guidelines for the Diagnosis and Management of Patients with Thoracic Aortic Disease. Circulation. 2010; 121: Q034-V425 2. Evidence of significant right heart failure. Probable early alveolar edema in the upper lung zones bilaterally. 3. Sclerotic foci in several bilateral ribs as well as the right superior pubic bone. These are fairly  well-circumscribed and very dense. These may all represent benign bone islands. Sclerotic bony metastatic disease cannot be entirely excluded and correlation with PSA is recommended. 4. Evidence of prior asbestos exposure with multiple bilateral calcified pleural plaques. Associated significant chronic lung disease with advanced cystic changes at the lung bases bilaterally. 5. Unexplained diffuse dilatation of the pancreatic duct without obvious focal mass. This may be on the basis of prior pancreatitis. 6. 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 7. Possible mild wall thickening involving the distal stomach and/or proximal duodenum. This may be on the basis of peptic disease. Electronically Signed   By: Aletta Edouard M.D.   On: 03/26/2018 21:02   Ct Abdomen Pelvis W Contrast  Result Date: 03/26/2018 CLINICAL DATA:  Failure to thrive, decreased appetite, weight loss, fatigue. EXAM: CT CHEST, ABDOMEN, AND PELVIS WITH CONTRAST TECHNIQUE: Multidetector CT imaging of  the chest, abdomen and pelvis was performed following the standard protocol during bolus administration of intravenous contrast. CONTRAST:  123m OMNIPAQUE IOHEXOL 300 MG/ML  SOLN COMPARISON:  CT of the abdomen and pelvis without contrast on 09/01/2005 FINDINGS: CT CHEST FINDINGS Cardiovascular: The aortic root measures approximately 4.4 cm. There is mild dilatation of the ascending thoracic aorta measuring 4.3 cm. The aortic arch is also mildly dilated at 3.8 cm and the descending thoracic aorta measures 3 cm. No evidence of aortic dissection. Proximal great vessels are normally patent. Right-sided heart chambers are enlarged. There is prominent reflux of contrast into the intrahepatic IVC and diffusely throughout the hepatic veins. Findings are consistent with right heart failure and elevated right heart pressures. No pericardial effusion. Scattered calcified plaque is noted in the coronary arteries.  Central pulmonary arteries are normal in caliber. Mediastinum/Nodes: No enlarged mediastinal, hilar, or axillary lymph nodes. Thyroid gland, trachea, and esophagus demonstrate no significant findings. Lungs/Pleura: Multiple calcified pleural plaques are present bilaterally consistent with asbestos exposure. There is significant emphysematous lung disease and cystic disease at the bases. Upper lung zones bilaterally as well as the superior segment of the left lower lobe demonstrate mild patchy areas of ground-glass opacity. This is felt to most likely be on the basis of mild pulmonary edema. No focal pulmonary masses. No pneumothorax. No pleural fluid identified. Musculoskeletal: There are some focal dense sclerotic areas in the ribs including the right anterior third, right fourth, left first, left third and left fifth ribs. No other bony lesions identified in the chest. Vertebral bodies show no evidence of fracture. CT ABDOMEN PELVIS FINDINGS Hepatobiliary: No focal liver abnormality is seen. No gallstones, gallbladder wall thickening, or biliary dilatation. Pancreas: There is diffuse dilatation of the pancreatic duct up to approximately 5 mm. The duct appears smooth and regular with no side branch dilatation. No evidence of visible mass, significant pancreatic atrophy or ductal calculi. No evidence of overt pancreatitis or surrounding fluid collections. Spleen: Normal in size without focal abnormality. Adrenals/Urinary Tract: Status post prior bilateral nephrectomy. A left pelvic transplant kidney is present. This demonstrates no hydronephrosis. Delayed imaging shows excretion of contrast by the transplant kidney and flow of contrast into the transplant ureter and bladder. No adrenal masses. Stomach/Bowel: Bowel shows no evidence of obstruction. There may be some mild wall thickening involving the distal stomach and/or proximal duodenum. No evidence of free air. Diffuse diverticulosis present of the sigmoid colon  without evidence of diverticulitis. Vascular/Lymphatic: Atherosclerosis of the distal aorta and iliac arteries without evidence of aneurysmal disease. Reproductive: Mild prostate enlargement. Other: No abdominal wall hernia or abnormality. No abdominopelvic ascites. Musculoskeletal: Area of sclerosis in the right superior pubic bone. Mild loss of height of the L3 and L4 vertebral bodies with superior endplate compression. No other bony lesions identified. IMPRESSION: 1. Mild aneurysmal disease of the ascending thoracic aorta measuring 4.3 cm in greatest diameter. No dissection. Recommend annual imaging followup by CTA or MRA. This recommendation follows 2010 ACCF/AHA/AATS/ACR/ASA/SCA/SCAI/SIR/STS/SVM Guidelines for the Diagnosis and Management of Patients with Thoracic Aortic Disease. Circulation. 2010; 121: [id number]. Evidence of significant right heart failure. Probable early alveolar edema in the upper lung zones bilaterally. 3. Sclerotic foci in several bilateral ribs as well as the right superior pubic bone. These are fairly well-circumscribed and very dense. These may all represent benign bone islands. Sclerotic bony metastatic disease cannot be entirely excluded and correlation with PSA is recommended. 4. Evidence of prior asbestos exposure with multiple bilateral calcified pleural plaques.  Associated significant chronic lung disease with advanced cystic changes at the lung bases bilaterally. 5. Unexplained diffuse dilatation of the pancreatic duct without obvious focal mass. This may be on the basis of prior pancreatitis. 6. 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 7. Possible mild wall thickening involving the distal stomach and/or proximal duodenum. This may be on the basis of peptic disease. Electronically Signed   By: Aletta Edouard M.D.   On: 03/26/2018 21:02   Dg Esophagus  Result Date: 03/27/2018 CLINICAL DATA:  Difficulty swallowing, dysphagia  EXAM: ESOPHOGRAM/BARIUM SWALLOW TECHNIQUE: Single contrast examination was performed using  thin barium. FLUOROSCOPY TIME:  Fluoroscopy Time:  3 minutes 24 seconds Radiation Exposure Index (if provided by the fluoroscopic device): Number of Acquired Spot Images: 0 COMPARISON:  CT 03/26/2018 FINDINGS: Fluoroscopic evaluation of swallowing demonstrates disruption of primary esophageal peristaltic waves. There is no fixed stricture or fold thickening. Small hiatal hernia noted. Just above the hiatal hernia, there is a moderate-sized distal esophageal diverticulum. No reflux with the water siphon maneuver. The patient swallowed a 13 mm barium tablet which sticks in the distal esophagus despite the lack of visible fixed stricture in the area. IMPRESSION: Esophageal dysmotility. Small hiatal hernia. Moderate-sized distal esophageal diverticulum. The 13 mm barium tablet sticks in the distal esophagus above the diverticulum despite lack of visible fixed stricture. Electronically Signed   By: Rolm Baptise M.D.   On: 03/27/2018 13:44   Dg Chest Port 1 View  Result Date: 03/27/2018 CLINICAL DATA:  84 year old male with sepsis. EXAM: PORTABLE CHEST 1 VIEW COMPARISON:  Chest CT dated 03/26/2018 FINDINGS: There is emphysematous changes of the lungs with interstitial coarsening. Small scattered hazy areas primarily in the left lung may be chronic although developing infiltrate is not excluded. Clinical correlation is recommended. There is no focal consolidation, pleural effusion, or pneumothorax. Small calcified pleural plaques noted. Mild cardiomegaly. The thoracic aorta is tortuous with atherosclerotic calcification. There is degenerative changes of the spine. No acute osseous pathology. IMPRESSION: 1. Emphysema.  No focal consolidation or pneumothorax. 2. Areas of hazy density may be chronic or represent developing infiltrate. Clinical correlation is recommended. Electronically Signed   By: Anner Crete M.D.   On:  03/27/2018 00:11     Subjective: He is feeling better, eating better. Denies dyspnea.   Discharge Exam: Vitals:   03/29/18 1152 03/29/18 1200  BP: (!) 127/98 106/85  Pulse: 72   Resp: 17   Temp: 98.5 F (36.9 C)   SpO2: 97% 100%   Vitals:   03/29/18 0800 03/29/18 0900 03/29/18 1152 03/29/18 1200  BP: (!) 136/101 (!) 131/94 (!) 127/98 106/85  Pulse:   72   Resp:   17   Temp:   98.5 F (36.9 C)   TempSrc:   Oral   SpO2: 98% 95% 97% 100%  Weight:      Height:        General: Pt is alert, awake, not in acute distress Cardiovascular: RRR, S1/S2 +, no rubs, no gallops Respiratory: CTA bilaterally, no wheezing, no rhonchi Abdominal: Soft, NT, ND, bowel sounds + Extremities: no edema, no cyanosis    The results of significant diagnostics from this hospitalization (including imaging, microbiology, ancillary and laboratory) are listed below for reference.     Microbiology: Recent Results (from the past 240 hour(s))  Culture, blood (Routine x 2)     Status: None (Preliminary result)   Collection Time: 03/26/18  4:00 PM  Result Value Ref  Range Status   Specimen Description BLOOD RIGHT ARM  Final   Special Requests   Final    BOTTLES DRAWN AEROBIC AND ANAEROBIC Blood Culture results may not be optimal due to an inadequate volume of blood received in culture bottles   Culture   Final    NO GROWTH 3 DAYS Performed at [facility], [place] [address], [place], [place] [address]    Report Status PENDING  Incomplete  Culture, blood (Routine x 2)     Status: None (Preliminary result)   Collection Time: 03/26/18  4:16 PM  Result Value Ref Range Status   Specimen Description BLOOD LEFT ARM  Final   Special Requests   Final    BOTTLES DRAWN AEROBIC AND ANAEROBIC Blood Culture adequate volume   Culture   Final    NO GROWTH 3 DAYS Performed at [facility], [address], [place], [place] [address]    Report Status PENDING  Incomplete  MRSA PCR Screening     Status:  None   Collection Time: 03/27/18 12:59 AM  Result Value Ref Range Status   MRSA by PCR NEGATIVE NEGATIVE Final    Comment:        The GeneXpert MRSA Assay (FDA approved for NASAL specimens only), is one component of a comprehensive MRSA colonization surveillance program. It is not intended to diagnose MRSA infection nor to guide or monitor treatment for MRSA infections. Performed at [facility], [place] [address] [place], [place] [address]      Labs: BNP (last 3 results) No results for input(s): BNP in the last 8760 hours. Basic Metabolic Panel: Recent Labs  Lab 03/26/18 1616 03/26/18 2344 03/27/18 0525 03/27/18 0928 03/28/18 0901 03/29/18 0254  NA 132*  --  134*  --  135 136  K 4.1  --  4.0  --  4.0 4.1  CL 96*  --  101  --  99 103  CO2 27  --  25  --  26 26  GLUCOSE 90  --  72  --  112* 99  BUN 19  --  19  --  16 16  CREATININE 1.52*  --  1.41*  --  1.63* 1.54*  CALCIUM 8.8*  --  8.5*  --  8.5* 8.5*  MG  --  1.6*  --  2.1  --  1.7   Liver Function Tests: Recent Labs  Lab 03/26/18 1616  AST 24  ALT 11  ALKPHOS 71  BILITOT 0.7  PROT 7.6  ALBUMIN 2.9*   Recent Labs  Lab 03/26/18 1753  LIPASE 35   No results for input(s): AMMONIA in the last 168 hours. CBC: Recent Labs  Lab 03/26/18 1616 03/27/18 0525 03/28/18 0841 03/29/18 0254  WBC 4.3 5.2 5.6 6.2  NEUTROABS 2.6  --   --   --   HGB 13.3 11.7* 12.3* 11.8*  HCT 42.4 36.2* 38.1* 36.9*  MCV 100.2* 97.1 99.0 97.9  PLT 195 178 182 179   Cardiac Enzymes: Recent Labs  Lab 03/26/18 2344 03/27/18 0525 03/27/18 0928  TROPONINI 0.08* 0.09* 0.07*   BNP: Invalid input(s): POCBNP CBG: No results for input(s): GLUCAP in the last 168 hours. D-Dimer No results for input(s): DDIMER in the last 72 hours. Hgb A1c No results for input(s): HGBA1C in the last 72 hours. Lipid Profile No results for input(s): CHOL, HDL, LDLCALC, TRIG, CHOLHDL, LDLDIRECT in the last 72 hours. Thyroid function  studies Recent Labs  03/26/18 2344  TSH 1.972   Anemia work up No results for input(s): VITAMINB12, FOLATE, FERRITIN, TIBC, IRON, RETICCTPCT in the last 72 hours. Urinalysis    Component Value Date/Time   COLORURINE YELLOW 03/26/2018 1940   APPEARANCEUR CLEAR 03/26/2018 1940   LABSPEC 1.010 03/26/2018 1940   PHURINE 5.0 03/26/2018 1940   GLUCOSEU NEGATIVE 03/26/2018 1940   HGBUR MODERATE (A) 03/26/2018 1940   BILIRUBINUR NEGATIVE 03/26/2018 [place] NEGATIVE 03/26/2018 1940   PROTEINUR 100 (A) 03/26/2018 1940   NITRITE NEGATIVE 03/26/2018 1940   LEUKOCYTESUR NEGATIVE 03/26/2018 1940   Sepsis Labs Invalid input(s): PROCALCITONIN,  WBC,  LACTICIDVEN Microbiology Recent Results (from the past 240 hour(s))  Culture, blood (Routine x 2)     Status: None (Preliminary result)   Collection Time: 03/26/18  4:00 PM  Result Value Ref Range Status   Specimen Description BLOOD RIGHT ARM  Final   Special Requests   Final    BOTTLES DRAWN AEROBIC AND ANAEROBIC Blood Culture results may not be optimal due to an inadequate volume of blood received in culture bottles   Culture   Final    NO GROWTH 3 DAYS Performed at [facility], [place] [address], [place], [place] [address]    Report Status PENDING  Incomplete  Culture, blood (Routine x 2)     Status: None (Preliminary result)   Collection Time: 03/26/18  4:16 PM  Result Value Ref Range Status   Specimen Description BLOOD LEFT ARM  Final   Special Requests   Final    BOTTLES DRAWN AEROBIC AND ANAEROBIC Blood Culture adequate volume   Culture   Final    NO GROWTH 3 DAYS Performed at [facility], [address] [place], [place] [address]    Report Status PENDING  Incomplete  MRSA PCR Screening     Status: None   Collection Time: 03/27/18 12:59 AM  Result Value Ref Range Status   MRSA by PCR NEGATIVE NEGATIVE Final    Comment:        The GeneXpert MRSA Assay (FDA approved for NASAL specimens only), is one  component of a comprehensive MRSA colonization surveillance program. It is not intended to diagnose MRSA infection nor to guide or monitor treatment for MRSA infections. Performed at [facility], [place] [address], [place], [place] [address]      Time coordinating discharge: 35 minutes.   SIGNED:   Elmarie Shiley, MD  [facility] 03/29/2018, 4:02 PM Pager [phone]  If 7PM-7AM, please contact night-coverage [email or web address] Password [email or web address]

## 2018-03-29 NOTE — Consult Note (Signed)
[facility] CM Primary Care Navigator  03/29/2018  [name] [name] 1928/04/13 [id number]    Met withpatientand daughter (Sarita)at the bedsideto identify possible discharge needs. Daughterreports that patient was "not feeling well", had chills, increased weakness, loss of appetite and found to have irregular heart beats that had ledto this admission.(SIRS- systemic inflammatory response syndrome, atrial fibrillation with rapid ventricular response)  Daughterendorses Dr. Seward Carol with [facility] Internal Medicine at [facility] as [place] care provider.Per daughter, patient had a recent appointment (new patient) to see provider but was cancelled since patient was admitted to the hospital.    Patient's [facility] [address] and [employer] service to obtain medications without difficulty.   Patient has been managinghismedications at [employer] assistance, straight out of the containers. Daughter reports that she has been checking to make sure that patient is taking his medications regularly.  Patient's daughter or son Iles) are providingtransportation to hisdoctors'appointments.  Patientliveswith daughter who is his primary caregiver at home.  Anticipateddischarge planishomeonce rollator is delivered to room per Inpatient CM.  Patientand daughtervoiced understanding to call primary care provider's officewhenhereturnshomefor a post discharge follow-up within1- 2weeksor sooner if needs arise.Patient letter (with PCP's contact number) was provided asareminder.   Discussed with patientand daughter about[facility] CM services available for health management and resources at home butdeniedany needs or concerns for now. Daughter states that she has been there assisting patient in managing his health conditions at home with her brother's help. Both patient and daughter are looking forward to  meet and discuss with cardiologist first, regarding further management of atrial fibrillation. Daughter had expressed understanding of need to [facility] primary care provider to [facility] care management if deemed necessary and appropriate for any services in the near future.  [facility] care management information was provided for future needs thatpatient may have.  Patient/ daughterwould only opt and verbally agree forEMMI calls to follow-upwithhis recovery at home, at this time.Marland Kitchen  Referral made for [facility] General calls after discharge.   For additional questions please contact:  Edwena Felty A. Gaila Engebretsen, BSN, RN-BC [facility] PRIMARY CARE Navigator Cell: [phone]

## 2018-03-29 NOTE — Progress Notes (Signed)
Pt being discharged home via wheelchair with family. Pt alert and oriented x4. VSS. Pt c/o no pain at this time. No signs of respiratory distress. Education complete and care plans resolved. IV removed with catheter intact and pt tolerated well. No further issues at this time. Pt to follow up with PCP. Rhegan Trunnell R, RN 

## 2018-03-29 NOTE — Progress Notes (Addendum)
Reasonable rate control overnight - with bradycardia at times. Will consolidate and slightly lower diltiazem dose to 180 mg CD daily starting today. May need additional anti-hypertensive agent as SIRS resolves since ARB has been stopped.  [facility] will sign off.   Cardiac medication Recommendations:  Cardizem CD 180 mg daily, Eliquis 2.5 mg daily Other recommendations (labs, testing, etc):  none Follow up as an outpatient:  Follow-up with me after discharge in 3-4 weeks.  Pixie Casino, MD, [facility], [place] Director of the Advanced Lipid Disorders &  Cardiovascular Risk Reduction Clinic Diplomate of the American Board of Clinical Lipidology Attending Cardiologist  Direct Dial: [phone]  Fax: [phone]  Website:  [email or web address]

## 2018-03-29 NOTE — Progress Notes (Signed)
Occupational Therapy Evaluation Patient Details Name: [name] [name] MRN: [id number] DOB: 11-Oct-1927 Today's Date: 03/29/2018    History of Present Illness Pt is an 84 y.o. male admitted 03/26/18 admitted with c/o malaise, urinary frequency, nausea, poor appetite. Worked up for SIRS,  a-fib with RVR, acute on CKD III, severe malnutrition. Abdominal/pelvic CT shows mild ascending thoracic aorta aneurysm; sclerotic foci in several bilateral ribs and R superior pubic bone that may represent benign bone islands or metastatic disease. PMH includes HTN, anemia, CKD s/p renal transplant.   Clinical Impression   PTA, pt was living at home with his daughter, pt was independent with ADLs and functional mobility. PTA, pt enjoyed mowing the lawn. Pt currently requires min guard for high level functional mobility with ADLs due to increased WOB, unable to get a good SpO2 read, but pt able to maintain conversation during functional mobility. Pt requires S for ADLs while seated. Pt's daughter reports pt will have S and physical assist 24/7 upon d/c home. All education complete and family has no additional questions. No additional acute OT needs. Pt to d/c home with supportive daughter when medically appropriate.      Follow Up Recommendations  No OT follow up;Supervision/Assistance - 24 hour    Equipment Recommendations  3 in 1 bedside commode    Recommendations for Other Services       Precautions / Restrictions Precautions Precautions: Fall Restrictions Weight Bearing Restrictions: No      Mobility Bed Mobility Overal bed mobility: Independent                Transfers Overall transfer level: Needs assistance Equipment used: Rolling walker (2 wheeled) Transfers: Sit to/from Stand Sit to Stand: Min guard         General transfer comment: min guard initially for stability    Balance Overall balance assessment: Needs assistance   Sitting balance-Leahy Scale: Good Sitting balance -  Comments: able to don pants while sitting EOB   Standing balance support: No upper extremity supported;During functional activity Standing balance-Leahy Scale: Good Standing balance comment: able to use urinal to urniate while standing                           ADL either performed or assessed with clinical judgement   ADL Overall ADL's : Needs assistance/impaired Eating/Feeding: Set up;Sitting   Grooming: Standing;Supervision/safety   Upper Body Bathing: Min guard   Lower Body Bathing: Min guard   Upper Body Dressing : Supervision/safety   Lower Body Dressing: Supervision/safety Lower Body Dressing Details (indicate cue type and reason): pt donned briefs while laying in bed;donned pants sitting EOB; Toilet Transfer: Supervision/safety;Ambulation;RW   Toileting- Water quality scientist and Hygiene: Min guard;Sit to/from stand Toileting - Clothing Manipulation Details (indicate cue type and reason): pt used urinal while standing, with minguard to assist with clothing management and stability     Functional mobility during ADLs: Min guard;Rolling walker General ADL Comments: educated pt/daughter on energy conservation strategies to utilize with ADLs     Vision Patient Visual Report: No change from baseline       Perception     Praxis      Pertinent Vitals/Pain Pain Assessment: No/denies pain     Hand Dominance Right   Extremity/Trunk Assessment Upper Extremity Assessment Upper Extremity Assessment: Overall WFL for tasks assessed   Lower Extremity Assessment Lower Extremity Assessment: Defer to PT evaluation   Cervical / Trunk Assessment Cervical / Trunk Assessment: Kyphotic  Communication Communication Communication: Expressive difficulties;HOH   Cognition Arousal/Alertness: Awake/alert Behavior During Therapy: WFL for tasks assessed/performed Overall Cognitive Status: Within Functional Limits for tasks assessed                                      General Comments  daughter present during session, asking about using pulse ox to monitor pt's HR and spO2;educated pt/daughter on energy conservation strategies with provided handout;rollator to be provided prior to d/c    Exercises     Shoulder Instructions      Home Living Family/patient expects to be discharged to:: Private residence Living Arrangements: Children Available Help at Discharge: Family;Available PRN/intermittently Type of Home: House Home Access: Level entry     Home Layout: One level     Bathroom Shower/Tub: Teacher, early years/pre: Standard     Home Equipment: None          Prior Functioning/Environment Level of Independence: Needs assistance  Gait / Transfers Assistance Needed: Indep with ambulation; daughter reports fatigue when walking at grocery store. Family drives ADL's / Homemaking Assistance Needed: Indep with ADLs            OT Problem List: Decreased activity tolerance;Decreased knowledge of use of DME or AE;Cardiopulmonary status limiting activity      OT Treatment/Interventions:      OT Goals(Current goals can be found in the care plan section) Acute Rehab OT Goals Patient Stated Goal: Get back to mowing the yard OT Goal Formulation: With patient Time For Goal Achievement: 04/12/18 Potential to Achieve Goals: Good  OT Frequency:     Barriers to D/C:            Co-evaluation              AM-PAC PT "6 Clicks" Daily Activity     Outcome Measure Help from another person eating meals?: None Help from another person taking care of personal grooming?: A Little Help from another person toileting, which includes using toliet, bedpan, or urinal?: A Little Help from another person bathing (including washing, rinsing, drying)?: A Little Help from another person to put on and taking off regular upper body clothing?: None Help from another person to put on and taking off regular lower body clothing?:  None 6 Click Score: 21   End of Session Equipment Utilized During Treatment: Gait belt;Rolling walker Nurse Communication: Mobility status;Other (comment)(need for 3 in 1)  Activity Tolerance: Patient tolerated treatment well Patient left: in chair;with call bell/phone within reach;with family/visitor present  OT Visit Diagnosis: Unsteadiness on feet (R26.81);Other abnormalities of gait and mobility (R26.89);Muscle weakness (generalized) (M62.81)                Time: 3202-3343 OT Time Calculation (min): 33 min Charges:    G-Codes:     Dorinda Hill OTS    Dorinda Hill 03/29/2018, 3:22 PM

## 2018-03-29 NOTE — Progress Notes (Signed)
OT Note Addendum for Charges    03/29/18 1525  OT Visit Information  Last OT Received On 03/29/18  OT General Charges  $OT Visit 1 Visit  OT Evaluation  $OT Eval Moderate Complexity 1 Mod  OT Treatments  $Self Care/Home Management  8-22 mins   Dolph Tavano A. Ulice Brilliant, M.S., OTR/L Acute Rehab Department: [phone]

## 2018-03-29 NOTE — Progress Notes (Signed)
Pharmacy Antibiotic Note  [name] [name] is a 84 y.o. male admitted on 03/26/2018 with sepsis. Pharmacy has been consulted for Zosyn dosing; vancomycin discontinued.   AKI improving, SCr down to 1.54. No source identified, PCT <0.1. Afebrile, cultures are ngtd.   Plan: Zosyn 3.375 g IV q8h to be infused over 4 hours - consider trial off antibiotics Pharmacy signing off but will continue to follow peripherally   Height: 6\' 3"  (190.5 cm) Weight: 138 lb 3.7 oz (62.7 kg) IBW/kg (Calculated) : 84.5  Temp (24hrs), Avg:98.4 F (36.9 C), Min:97.9 F (36.6 C), Max:98.7 F (37.1 C)  Recent Labs  Lab 03/26/18 1616 03/26/18 1638 03/26/18 1809 03/26/18 2344 03/27/18 0525 03/28/18 0841 03/28/18 0901 03/29/18 0254  WBC 4.3  --   --   --  5.2 5.6  --  6.2  CREATININE 1.52*  --   --   --  1.41*  --  1.63* 1.54*  LATICACIDVEN  --  2.49* 2.72* 1.4  --   --   --   --     Estimated Creatinine Clearance: 30 mL/min (A) (by C-G formula based on SCr of 1.54 mg/dL (H)).    Allergies  Allergen Reactions  . Ciprofloxacin Rash  . Clindamycin/Lincomycin Rash  . Levaquin [Levofloxacin] Rash  . Septra [Sulfamethoxazole-Trimethoprim] Rash    Renold Genta, PharmD, BCPS Clinical Pharmacist Clinical phone for 03/29/2018 until 3p is [phone] Please check AMION for all Pharmacist numbers by unit 03/29/2018 11:04 AM

## 2018-03-29 NOTE — Progress Notes (Signed)
Physical Therapy Treatment Patient Details Name: [name] [name] MRN: [id number] DOB: 05-14-1928 Today's Date: 03/29/2018    History of Present Illness Pt is an 84 y.o. male admitted 03/26/18 admitted with c/o malaise, urinary frequency, nausea, poor appetite. Worked up for SIRS,  a-fib with RVR, acute on CKD III, severe malnutrition. Abdominal/pelvic CT shows mild ascending thoracic aorta aneurysm; sclerotic foci in several bilateral ribs and R superior pubic bone that may represent benign bone islands or metastatic disease. PMH includes HTN, anemia, CKD s/p renal transplant.   PT Comments    Pt seen today for gait training with rollator. Pt ambulating with rollator at supervision-level safety; good technique using device for seated rest break, demonstrates good safety with locking brakes. Son present and educated as well. Pt remains limited by decreased activity tolerance. Recommend use of rollator when ambulating community distances; pt in agreement with this. Has met short-term acute PT goals; will have support from family upon return home. D/c acute PT.   Follow Up Recommendations  No PT follow up;Supervision - Intermittent     Equipment Recommendations  (rollator)    Recommendations for Other Services       Precautions / Restrictions Precautions Precautions: Fall Restrictions Weight Bearing Restrictions: No    Mobility  Bed Mobility Overal bed mobility: Independent                Transfers Overall transfer level: Modified independent Equipment used: None Transfers: Sit to/from Stand              Ambulation/Gait Ambulation/Gait assistance: Supervision Gait Distance (Feet): 200 Feet Assistive device: 4-wheeled walker Gait Pattern/deviations: Step-through pattern;Decreased stride length Gait velocity: Decreased Gait velocity interpretation: 1.31 - 2.62 ft/sec, indicative of limited community ambulator General Gait Details: 1x seated rest break on rollator,  good technique with brakes and safety   Stairs             Wheelchair Mobility    Modified Rankin (Stroke Patients Only)       Balance Overall balance assessment: Needs assistance   Sitting balance-Leahy Scale: Good       Standing balance-Leahy Scale: Good                 High Level Balance Comments: No overt instability or LOB             Cognition Arousal/Alertness: Awake/alert Behavior During Therapy: WFL for tasks assessed/performed Overall Cognitive Status: Within Functional Limits for tasks assessed                                        Exercises      General Comments General comments (skin integrity, edema, etc.): Son present throughout session      Pertinent Vitals/Pain Pain Assessment: No/denies pain    Home Living                      Prior Function            PT Goals (current goals can now be found in the care plan section) Acute Rehab PT Goals PT Goal Formulation: All assessment and education complete, DC therapy Progress towards PT goals: Goals met/education completed, patient discharged from PT    Frequency    Min 3X/week      PT Plan Current plan remains appropriate    Co-evaluation  AM-PAC PT "6 Clicks" Daily Activity  Outcome Measure  Difficulty turning over in bed (including adjusting bedclothes, sheets and blankets)?: None Difficulty moving from lying on back to sitting on the side of the bed? : None Difficulty sitting down on and standing up from a chair with arms (e.g., wheelchair, bedside commode, etc,.)?: None Help needed moving to and from a bed to chair (including a wheelchair)?: A Little Help needed walking in hospital room?: A Little Help needed climbing 3-5 steps with a railing? : A Little 6 Click Score: 21    End of Session Equipment Utilized During Treatment: Gait belt Activity Tolerance: Patient tolerated treatment well Patient left: in bed;with  call bell/phone within reach;with family/visitor present Nurse Communication: Mobility status PT Visit Diagnosis: Other abnormalities of gait and mobility (R26.89)     Time: 4707-6151 PT Time Calculation (min) (ACUTE ONLY): 18 min  Charges:  $Gait Training: 8-22 mins                    G Codes:      Mabeline Caras, PT, DPT Acute Rehab Services  Pager: [place] 03/29/2018, 11:07 AM

## 2018-03-29 NOTE — Care Management Important Message (Signed)
Important Message  Patient Details  Name: [name] [name] MRN: [id number] Date of Birth: 1927-11-22   Medicare Important Message Given:  Yes    Orbie Pyo 03/29/2018, 3:15 PM

## 2018-03-29 NOTE — Care Management Note (Addendum)
Case Management Note  Patient Details  Name: [name] [name] MRN: [id number] Date of Birth: May 05, 1928  Subjective/Objective:   From home, presents with SiRs, for dc today, will need a rollator, daughter would like to work with [facility], referral made to [place] with [facility]. Patient needs bsc , referral made to [facility] with [facility]                Action/Plan: DC home when rollator delivered to room.   Expected Discharge Date:  03/29/18               Expected Discharge Plan:  Home/Self Care  In-House Referral:     Discharge planning Services  CM Consult  Post Acute Care Choice:  Durable Medical Equipment Choice offered to:  Patient, Adult Children  DME Arranged:  Walker rolling with seat, [facility] DME Agency:  [place]:    [place] Agency:     Status of Service:  Completed, signed off  If discussed at [place] of Stay Meetings, dates discussed:    Additional Comments:  Zenon Mayo, RN 03/29/2018, 2:05 PM

## 2018-03-30 ENCOUNTER — Telehealth: Payer: Self-pay | Admitting: Physician Assistant

## 2018-03-30 NOTE — Telephone Encounter (Signed)
Patient's daughter paged after hour answering service. Eliquis cost over $400. Patient was not given a 30 day free coupon card leaving the hospital either. She could not afford eliquis at current price. Recommend daughter:  1. Set up appt early Monday morning, figure which PA of Dr. Debara Pickett patient will followup with. 2. Ask for eliquis samples 3. Drop off eliquis meds assistance form 4. Ask to see if patient qualify for 30 day free eliquis coupon.   Hilbert Corrigan PA Pager: [phone]

## 2018-03-31 LAB — CULTURE, BLOOD (ROUTINE X 2)
CULTURE: NO GROWTH
Culture: NO GROWTH
Special Requests: ADEQUATE

## 2018-04-01 ENCOUNTER — Telehealth: Payer: Self-pay | Admitting: *Deleted

## 2018-04-01 NOTE — Telephone Encounter (Signed)
Eliquis 2.5 mg twice a day #2 lot #[id number] exp 7/20 samples,  30 day card free card given, and patient assistance forms given to [employer].

## 2018-04-02 DIAGNOSIS — Z8673 Personal history of transient ischemic attack (TIA), and cerebral infarction without residual deficits: Secondary | ICD-10-CM | POA: Diagnosis not present

## 2018-04-02 DIAGNOSIS — I4891 Unspecified atrial fibrillation: Secondary | ICD-10-CM | POA: Diagnosis not present

## 2018-04-02 DIAGNOSIS — R319 Hematuria, unspecified: Secondary | ICD-10-CM | POA: Diagnosis not present

## 2018-04-02 DIAGNOSIS — Z0000 Encounter for general adult medical examination without abnormal findings: Secondary | ICD-10-CM | POA: Diagnosis not present

## 2018-04-02 DIAGNOSIS — N182 Chronic kidney disease, stage 2 (mild): Secondary | ICD-10-CM | POA: Diagnosis not present

## 2018-04-02 DIAGNOSIS — I129 Hypertensive chronic kidney disease with stage 1 through stage 4 chronic kidney disease, or unspecified chronic kidney disease: Secondary | ICD-10-CM | POA: Diagnosis not present

## 2018-04-02 DIAGNOSIS — Z940 Kidney transplant status: Secondary | ICD-10-CM | POA: Diagnosis not present

## 2018-04-02 DIAGNOSIS — Z86010 Personal history of colonic polyps: Secondary | ICD-10-CM | POA: Diagnosis not present

## 2018-04-02 DIAGNOSIS — R809 Proteinuria, unspecified: Secondary | ICD-10-CM | POA: Diagnosis not present

## 2018-04-02 DIAGNOSIS — E782 Mixed hyperlipidemia: Secondary | ICD-10-CM | POA: Diagnosis not present

## 2018-04-02 DIAGNOSIS — Z85528 Personal history of other malignant neoplasm of kidney: Secondary | ICD-10-CM | POA: Diagnosis not present

## 2018-04-02 DIAGNOSIS — E46 Unspecified protein-calorie malnutrition: Secondary | ICD-10-CM | POA: Diagnosis not present

## 2018-04-02 NOTE — Telephone Encounter (Signed)
Patient's daughter dropped of Eliquis patient assistance application on 5/68. This was completed and signed bu MD and faxed to [place] @ [phone]

## 2018-04-04 NOTE — Telephone Encounter (Signed)
Patient has been approved for eliquis patient assistance from [place] from 04/03/18 - 09/10/18

## 2018-04-05 ENCOUNTER — Encounter: Payer: Self-pay | Admitting: *Deleted

## 2018-04-05 ENCOUNTER — Other Ambulatory Visit: Payer: Self-pay | Admitting: *Deleted

## 2018-04-05 NOTE — Patient Outreach (Signed)
[place]) Care Management  04/05/2018  Tavarus Poteete 03/19/1928 [id number]  Referral via RED Alert-EMMI-General Discharge; Day # 1, 04/03/2018; Reason: Scheduled follow-up? No  Telephone call to patient; no answer-called alternate number; no answer; message left requesting return call.  Plan: Geophysicist/field seismologist. Follow up 2-4 business days.  Sherrin Daisy, RN BSN [place] Management Coordinator [facility] Care Management  [phone]

## 2018-04-09 ENCOUNTER — Telehealth: Payer: Self-pay | Admitting: Pharmacist

## 2018-04-09 NOTE — Telephone Encounter (Signed)
Patient assistance prescription has signature but is missing date.   Please call [phone] ext [phone] to clarify  Or fax to [phone]

## 2018-04-11 ENCOUNTER — Ambulatory Visit: Payer: Medicare Other | Admitting: Adult Health

## 2018-04-11 ENCOUNTER — Other Ambulatory Visit: Payer: Self-pay | Admitting: *Deleted

## 2018-04-11 NOTE — Telephone Encounter (Signed)
Called [facility] pharmacy and it was determined that MD signed Rx but date was missing. Notified pharmacy staff that the date should be 04/02/18. They will add this and process application.

## 2018-04-11 NOTE — Patient Outreach (Signed)
[facility] [facility]) Care Management  04/11/2018  [name] [name] July 26, 1928 [id number]   Referral via RED Alert-EMMI-General Discharge; Day # 1, 04/03/2018; Reason: Scheduled follow-up? No  Telephone call #2 to patient; left voicemail requesting call back.  Plan: Follow up 2-4 days. Outreach letter was sent 7/26.  Sherrin Daisy, RN BSN [place] Management Coordinator [facility] Care Management  [phone]

## 2018-04-15 ENCOUNTER — Ambulatory Visit (INDEPENDENT_AMBULATORY_CARE_PROVIDER_SITE_OTHER): Payer: Medicare Other | Admitting: Pulmonary Disease

## 2018-04-15 ENCOUNTER — Encounter: Payer: Self-pay | Admitting: Pulmonary Disease

## 2018-04-15 DIAGNOSIS — J439 Emphysema, unspecified: Secondary | ICD-10-CM | POA: Diagnosis not present

## 2018-04-15 NOTE — Patient Instructions (Signed)
Abnormal CT scan of the chest  Extensive emphysema, groundglass changes, no evidence of a mass, evidence of asbestos exposure in the past--CT was looked over with you  Not acutely ill at present  Findings on the CT may be from several different causes I do not see anything that puts you in harm's way at present  We will repeat CT in 3 months  Follow-up in 3 months just after CT  Call with any changes in symptoms

## 2018-04-15 NOTE — Progress Notes (Signed)
Subjective:    Patient ID: [name] [name], male    DOB: [birth year], 84 y.o.   MRN: [id number]  Reason for consultation-abnormal CT scan of the chest  HPI  He was recently hospitalized, treated for possible pneumonia Complicated medical history with a history of end-stage renal disease status post nephrectomy status post kidney transplant History of hypertension History of atrial fibrillation Denies any active cough at present Denies any chest pains or chest discomfort Denies any fevers or chills  History of chronic anemia, asbestos exposure, never smoker, hypertension, hyperparathyroidism  Did construction work in the past No pets, no recent travel, no significant contributing family history   His current list of medications reviewed Current Outpatient Medications on File Prior to Visit  Medication Sig Dispense Refill  . apixaban (ELIQUIS) 2.5 MG TABS tablet Take 1 tablet (2.5 mg total) by mouth 2 (two) times daily. 60 tablet 0  . aspirin 81 MG chewable tablet Chew 1 tablet (81 mg total) by mouth daily. 30 tablet 0  . cycloSPORINE modified (NEORAL) 100 MG capsule Take 100 mg by mouth 2 (two) times daily. 125 mg twice daily    . cycloSPORINE modified (NEORAL) 25 MG capsule Take 25 mg by mouth 2 (two) times daily. 125 mg twice daily    . diltiazem (CARDIZEM CD) 180 MG 24 hr capsule Take 1 capsule (180 mg total) by mouth daily. 30 capsule 0  . docusate sodium (COLACE) 100 MG capsule Take 200 mg by mouth daily as needed for mild constipation.    . famotidine (PEPCID) 20 MG tablet Take 1 tablet (20 mg total) by mouth at bedtime. 30 tablet 0  . feeding supplement, ENSURE ENLIVE, (ENSURE ENLIVE) LIQD Take 237 mLs by mouth 3 (three) times daily between meals. 237 mL 12  . HYDROcodone-acetaminophen (NORCO) 5-325 MG tablet Take 1 tablet by mouth every 6 (six) hours as needed for moderate pain. 20 tablet 0  . mycophenolate (MYFORTIC) 180 MG EC tablet Take 180 mg by mouth 2 (two) times daily.     . predniSONE (DELTASONE) 5 MG tablet Take 5 mg by mouth daily with breakfast.     No current facility-administered medications on file prior to visit.    Allergies  Allergen Reactions  . Ciprofloxacin Rash  . Clindamycin/Lincomycin Rash  . Levaquin [Levofloxacin] Rash  . Septra [Sulfamethoxazole-Trimethoprim] Rash    Review of Systems  Constitutional: Positive for unexpected weight change. Negative for fever.  HENT: Positive for congestion and trouble swallowing. Negative for dental problem, ear pain, nosebleeds, postnasal drip, rhinorrhea, sinus pressure, sneezing and sore throat.   Eyes: Negative for redness and itching.  Respiratory: Positive for shortness of breath and wheezing. Negative for cough and chest tightness.   Cardiovascular: Positive for palpitations and leg swelling.  Gastrointestinal: Negative for nausea and vomiting.  Genitourinary: Negative for dysuria.  Musculoskeletal: Positive for joint swelling.  Skin: Negative for rash.  Allergic/Immunologic: Negative.  Negative for environmental allergies, food allergies and immunocompromised state.  Neurological: Negative for headaches.  Hematological: Bruises/bleeds easily.  Psychiatric/Behavioral: Negative for dysphoric mood. The patient is not nervous/anxious.    Blood pressure 110/70, pulse 60, SpO2 100 %.      Objective:   Physical Exam  Constitutional: He is oriented to person, place, and time. He appears well-developed and well-nourished.  HENT:  Head: Normocephalic and atraumatic.  Eyes: Pupils are equal, round, and reactive to light. EOM are normal. Right eye exhibits no discharge. Left eye exhibits no discharge.  Neck: Normal  range of motion. Neck supple. No thyromegaly present.  Cardiovascular: Normal rate, regular rhythm and normal heart sounds.  Pulmonary/Chest: Effort normal and breath sounds normal. No stridor. He has no wheezes. He has no rales.  Abdominal: Bowel sounds are normal. He exhibits no  distension.  Musculoskeletal: Normal range of motion. He exhibits no deformity.  Neurological: He is alert and oriented to person, place, and time. No cranial nerve deficit.  Skin: Skin is warm and dry. No rash noted. No erythema.  Psychiatric: He has a normal mood and affect. His behavior is normal.   CT scan was reviewed with the patient and his son Areas of emphysema, pleural plaques, groundglass changes, bullous changes     Assessment & Plan:  .  Abnormal CT scan of the chest showing bullous changes, pleural plaques, groundglass changes .  Not feeling acutely ill at present .  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  Encouraged to stay active  .  Encouraged to call if any significant changes in symptoms  .  He has no significant symptoms at present suggesting an infectious process, does not appear to have any significant pulmonary limitation of present  The possibility of atypical infections, chronic inflammatory lung disease especially with his immune compromised status was also discussed  We will follow  I will see him again in about 3 months

## 2018-04-17 ENCOUNTER — Other Ambulatory Visit: Payer: Self-pay | Admitting: *Deleted

## 2018-04-17 NOTE — Patient Outreach (Signed)
[place] [facility]) Care Management  04/17/2018  Airon Sahni 1927-12-28 [id number]  Referral via RED Alert-EMMI-General Discharge; Day # 1, 04/03/2018; Reason: Scheduled follow-up? No  Telephone call attempt x 3; left voice message requesting call back.  Plan: Outreach letter has been sent. Follow up.  Sherrin Daisy, RN BSN [place] Management Coordinator [facility] Care Management  [phone]

## 2018-04-22 ENCOUNTER — Other Ambulatory Visit: Payer: Self-pay | Admitting: *Deleted

## 2018-04-22 NOTE — Patient Outreach (Signed)
[place] [facility]) Care Management  04/22/2018  Destyn Schuyler 08-11-1928 [id number]  EMMI-Referral:  Unsuccessful calls x 3; no response to outreach letter.  Plan: Case closure.  Sherrin Daisy, RN BSN [place] Management Coordinator [facility] Care Management  [phone]

## 2018-04-22 NOTE — Progress Notes (Signed)
Cardiology Office Note   Date:  04/23/2018   ID:  [name] [name], DOB August 30, 1928, MRN [id number]  PCP:  Mauricia Area, MD  Cardiologist:  Dr. Debara Pickett  Chief Complaint  Patient presents with  . Hospitalization Follow-up  . Atrial Fibrillation     History of Present Illness: [name] [name] is a 84 y.o. male who presents for post hospitalization follow up after being admitted for new onset atrial fib with RVR, with hx of CKD s/p renal transplant, HTN, and TIA.Marland Kitchen He also was complaining of left flank pain. He was treated with IV diltiazem and transitioned to oral diltiazem. He was started on Eliquis 2.5 mg BID.  Echocardiogram was completed during hospitalization which revealed LVEF of 55%-60% with normal wall motion. No valvular disease. Left atrium was moderately dilated. Mild TR  He is feeling well today. He denies rapid HR, bleeding or dyspnea. He is medically compliant.   Past Medical History:  Diagnosis Date  . Anemia in CKD (chronic kidney disease)   . Arthritis   . Bilateral hydrocele   . CKD (chronic kidney disease), stage II    nephrologist-  deterding  . Diverticulosis   . Dysuria   . H/O kidney transplant    right 1980/  left 1994 with right transplanted kidney removal  . Hematuria   . History of adenomatous polyp of colon    VILLOUS ADENOMA POLYP  . History of asbestos exposure   . History of condyloma acuminatum    genital  . 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 History of renal cell carcinoma    S/P  BILATERAL NEPHRECTOMY AND RENAL TRANSPLANTS  . History of small bowel obstruction    12/ 2006  due to adhesions -- resolved without surgerical intervention  . History of TIA (transient ischemic attack)   . Hyperlipidemia   . Hypertension   . Proteinuria   . Secondary hyperparathyroidism, renal ([place])   . Wears glasses     Past Surgical History:  Procedure Laterality Date  . COLONOSCOPY  10/17/2012   Procedure: COLONOSCOPY;  Surgeon:  Beryle Beams, MD;  Location: [facility];  Service: Endoscopy;  Laterality: N/A;  . CYSTOSCOPY N/A 07/13/2015   Procedure: CYSTOSCOPY;  Surgeon: Irine Seal, MD;  Location: [facility];  Service: Urology;  Laterality: N/A;  . HEMICOLECTOMY Right 1995   adenoma polyp  . HYDROCELE EXCISION Bilateral 07/13/2015   Procedure: RIGHT HYDROCELECTOMY, ADULT DRAINAGE OF LEFT HYDROCELE;  Surgeon: Irine Seal, MD;  Location: [facility];  Service: Urology;  Laterality: Bilateral;  . KIDNEY TRANSPLANT  right 1980; left 1994---  [facility]   right kidney removed 1989/  left kidney removed 1994     Current Outpatient Medications  Medication Sig Dispense Refill  . apixaban (ELIQUIS) 2.5 MG TABS tablet Take 1 tablet (2.5 mg total) by mouth 2 (two) times daily. 60 tablet 0  . aspirin 81 MG chewable tablet Chew 1 tablet (81 mg total) by mouth daily. 30 tablet 0  . cycloSPORINE modified (NEORAL) 100 MG capsule Take 100 mg by mouth 2 (two) times daily. 125 mg twice daily    . cycloSPORINE modified (NEORAL) 25 MG capsule Take 25 mg by mouth 2 (two) times daily. 125 mg twice daily    . diltiazem (CARDIZEM CD) 180 MG 24 hr capsule Take 1 capsule (180 mg total) by mouth daily. 30 capsule 5  . docusate sodium (COLACE) 100 MG capsule Take  200 mg by mouth daily as needed for mild constipation.    . famotidine (PEPCID) 20 MG tablet Take 1 tablet (20 mg total) by mouth at bedtime. 30 tablet 0  . feeding supplement, ENSURE ENLIVE, (ENSURE ENLIVE) LIQD Take 237 mLs by mouth 3 (three) times daily between meals. 237 mL 12  . HYDROcodone-acetaminophen (NORCO) 5-325 MG tablet Take 1 tablet by mouth every 6 (six) hours as needed for moderate pain. 20 tablet 0  . mycophenolate (MYFORTIC) 180 MG EC tablet Take 180 mg by mouth 2 (two) times daily.    . predniSONE (DELTASONE) 5 MG tablet Take 5 mg by mouth daily with breakfast.     No current facility-administered medications for this visit.      Allergies:   Ciprofloxacin; Clindamycin/lincomycin; Levofloxacin; and Septra [sulfamethoxazole-trimethoprim]    Social History:  The patient  reports that he has never smoked. He has never used smokeless tobacco. He reports that he does not drink alcohol or use drugs.   Family History:  The patient's family history is not on file.    ROS: All other systems are reviewed and negative. Unless otherwise mentioned in H&P    PHYSICAL EXAM: VS:  BP 116/74   Pulse 70   Ht 6\' 3"  (1.905 m)   Wt 131 lb 3.2 oz (59.5 kg)   BMI 16.40 kg/m  , BMI Body mass index is 16.4 kg/m. GEN: Well nourished, well developed, in no acute distress  HEENT: normal  Neck: no JVD, carotid bruits, or masses Cardiac: IRRR; no murmurs, rubs, or gallops,no edema  Respiratory:  clear to auscultation bilaterally, normal work of breathing GI: soft, nontender, nondistended, + BS MS: no deformity or atrophy  Skin: warm and dry, no rash Neuro:  Strength and sensation are intact Psych: euthymic mood, full affect   EKG:  Not completed this office visit.   Recent Labs: 03/26/2018: ALT 11; TSH 1.972 03/29/2018: BUN 16; Creatinine, Ser 1.54; Hemoglobin 11.8; Magnesium 1.7; Platelets 179; Potassium 4.1; Sodium 136    Lipid Panel No results found for: CHOL, TRIG, HDL, CHOLHDL, VLDL, LDLCALC, LDLDIRECT    Wt Readings from Last 3 Encounters:  04/23/18 131 lb 3.2 oz (59.5 kg)  03/28/18 138 lb 3.7 oz (62.7 kg)  07/13/15 139 lb (63 kg)      Other studies Reviewed: Echocardiogram 2018-04-07  Left ventricle: The cavity size was normal. There was mild   concentric hypertrophy. Systolic function was normal. The   estimated ejection fraction was in the range of 55% to 60%. Wall   motion was normal; there were no regional wall motion   abnormalities. - Aortic valve: Transvalvular velocity was within the normal range.   There was no stenosis. There was mild regurgitation. - Aorta: Ascending aortic diameter: 39 mm  (S). - Ascending aorta: The ascending aorta was mildly dilated. - Mitral valve: Transvalvular velocity was within the normal range.   There was no evidence for stenosis. There was no regurgitation. - Left atrium: The atrium was moderately to severely dilated. - Right ventricle: The cavity size was normal. Wall thickness was   normal. Systolic function was normal. - Atrial septum: No defect or patent foramen ovale was identified   by color flow Doppler. - Tricuspid valve: There was moderate regurgitation. - Pulmonary arteries: Systolic pressure was mildly increased. PA   peak pressure: 40 mm Hg (S).  ASSESSMENT AND PLAN:  1. Atrial fib: Rate is controlled on diltiazem. He is tolerating Eliquis without complaints of bleeding  or excessive bruising. His left atrium was severely dilated. Doubt a candidate for DCCV. Will continue medical management. I have advised him to be careful with falling or using sharp instruments on Eliquis. Refill provided on diltiazem.   2. Hypertension:  Very good control of BP today. Continue diltiazem only.    3. Chronic constipation: Advised to continue stool softener, especially being on Eliquis. He is to notify us for any bleeding or dark stools.    Current medicines are reviewed at length with the patient today.    Labs/ tests ordered today include: None  Phill Myron. [place] Pugh, ANP, AACC   04/23/2018 11:46 AM    [facility] [address], [place], [place] [address] Phone: [phone]; Fax: [phone]

## 2018-04-23 ENCOUNTER — Encounter: Payer: Self-pay | Admitting: Adult Health

## 2018-04-23 ENCOUNTER — Ambulatory Visit (INDEPENDENT_AMBULATORY_CARE_PROVIDER_SITE_OTHER): Payer: Medicare Other | Admitting: Adult Health

## 2018-04-23 VITALS — BP 116/74 | HR 70 | Ht 75.0 in | Wt 131.2 lb

## 2018-04-23 DIAGNOSIS — I482 Chronic atrial fibrillation: Secondary | ICD-10-CM

## 2018-04-23 DIAGNOSIS — I4821 Permanent atrial fibrillation: Secondary | ICD-10-CM

## 2018-04-23 DIAGNOSIS — I10 Essential (primary) hypertension: Secondary | ICD-10-CM

## 2018-04-23 MED ORDER — DILTIAZEM HCL ER COATED BEADS 180 MG PO CP24: 180.0000 mg | ORAL_CAPSULE | Freq: Every day | ORAL | 5 refills | Status: AC

## 2018-04-23 NOTE — Patient Instructions (Signed)
Medication Instructions:  NO CHANGES- Your physician recommends that you continue on your current medications as directed. Please refer to the Current Medication list given to you today.  If you need a refill on your cardiac medications before your next appointment, please call your pharmacy.  Follow-Up: Your physician wants you to follow-up in: 6 months with DR HILTY You should receive a reminder letter in the mail two months in advance. If you do not receive a letter, please call our office to schedule the follow-up appointment.   Thank you for choosing [facility] at [facility]!!

## 2018-05-07 DIAGNOSIS — I4891 Unspecified atrial fibrillation: Secondary | ICD-10-CM | POA: Diagnosis not present

## 2018-05-07 DIAGNOSIS — Z940 Kidney transplant status: Secondary | ICD-10-CM | POA: Diagnosis not present

## 2018-05-07 DIAGNOSIS — Z8673 Personal history of transient ischemic attack (TIA), and cerebral infarction without residual deficits: Secondary | ICD-10-CM | POA: Diagnosis not present

## 2018-05-07 DIAGNOSIS — E782 Mixed hyperlipidemia: Secondary | ICD-10-CM | POA: Diagnosis not present

## 2018-05-07 DIAGNOSIS — R319 Hematuria, unspecified: Secondary | ICD-10-CM | POA: Diagnosis not present

## 2018-05-07 DIAGNOSIS — E790 Hyperuricemia without signs of inflammatory arthritis and tophaceous disease: Secondary | ICD-10-CM | POA: Diagnosis not present

## 2018-05-07 DIAGNOSIS — Z85528 Personal history of other malignant neoplasm of kidney: Secondary | ICD-10-CM | POA: Diagnosis not present

## 2018-05-07 DIAGNOSIS — N182 Chronic kidney disease, stage 2 (mild): Secondary | ICD-10-CM | POA: Diagnosis not present

## 2018-05-07 DIAGNOSIS — I129 Hypertensive chronic kidney disease with stage 1 through stage 4 chronic kidney disease, or unspecified chronic kidney disease: Secondary | ICD-10-CM | POA: Diagnosis not present

## 2018-05-07 DIAGNOSIS — R809 Proteinuria, unspecified: Secondary | ICD-10-CM | POA: Diagnosis not present

## 2018-05-07 DIAGNOSIS — Z86010 Personal history of colonic polyps: Secondary | ICD-10-CM | POA: Diagnosis not present

## 2018-05-07 DIAGNOSIS — E46 Unspecified protein-calorie malnutrition: Secondary | ICD-10-CM | POA: Diagnosis not present

## 2018-05-17 DIAGNOSIS — N182 Chronic kidney disease, stage 2 (mild): Secondary | ICD-10-CM | POA: Diagnosis not present

## 2018-06-13 DIAGNOSIS — R350 Frequency of micturition: Secondary | ICD-10-CM | POA: Diagnosis not present

## 2018-06-13 DIAGNOSIS — R3121 Asymptomatic microscopic hematuria: Secondary | ICD-10-CM | POA: Diagnosis not present

## 2018-07-15 ENCOUNTER — Telehealth: Payer: Self-pay | Admitting: Internal Medicine

## 2018-07-15 DIAGNOSIS — Z940 Kidney transplant status: Secondary | ICD-10-CM | POA: Diagnosis not present

## 2018-07-15 DIAGNOSIS — N182 Chronic kidney disease, stage 2 (mild): Secondary | ICD-10-CM | POA: Diagnosis not present

## 2018-07-15 DIAGNOSIS — Z23 Encounter for immunization: Secondary | ICD-10-CM | POA: Diagnosis not present

## 2018-07-15 DIAGNOSIS — R319 Hematuria, unspecified: Secondary | ICD-10-CM | POA: Diagnosis not present

## 2018-07-15 DIAGNOSIS — Z85528 Personal history of other malignant neoplasm of kidney: Secondary | ICD-10-CM | POA: Diagnosis not present

## 2018-07-15 DIAGNOSIS — I129 Hypertensive chronic kidney disease with stage 1 through stage 4 chronic kidney disease, or unspecified chronic kidney disease: Secondary | ICD-10-CM | POA: Diagnosis not present

## 2018-07-15 DIAGNOSIS — Z7901 Long term (current) use of anticoagulants: Secondary | ICD-10-CM | POA: Diagnosis not present

## 2018-07-15 DIAGNOSIS — E46 Unspecified protein-calorie malnutrition: Secondary | ICD-10-CM | POA: Diagnosis not present

## 2018-07-15 DIAGNOSIS — Z8673 Personal history of transient ischemic attack (TIA), and cerebral infarction without residual deficits: Secondary | ICD-10-CM | POA: Diagnosis not present

## 2018-07-15 DIAGNOSIS — R809 Proteinuria, unspecified: Secondary | ICD-10-CM | POA: Diagnosis not present

## 2018-07-15 DIAGNOSIS — I4891 Unspecified atrial fibrillation: Secondary | ICD-10-CM | POA: Diagnosis not present

## 2018-07-15 DIAGNOSIS — E790 Hyperuricemia without signs of inflammatory arthritis and tophaceous disease: Secondary | ICD-10-CM | POA: Diagnosis not present

## 2018-07-15 NOTE — Telephone Encounter (Signed)
Faxed signed MD portion of [employer] patient assistance application for 2020 to [employer] @ [phone] 

## 2018-07-17 ENCOUNTER — Other Ambulatory Visit: Payer: Medicare Other

## 2018-07-18 DIAGNOSIS — R3121 Asymptomatic microscopic hematuria: Secondary | ICD-10-CM | POA: Diagnosis not present

## 2018-07-18 DIAGNOSIS — R351 Nocturia: Secondary | ICD-10-CM | POA: Diagnosis not present

## 2018-07-18 DIAGNOSIS — N401 Enlarged prostate with lower urinary tract symptoms: Secondary | ICD-10-CM | POA: Diagnosis not present

## 2018-07-22 ENCOUNTER — Ambulatory Visit (INDEPENDENT_AMBULATORY_CARE_PROVIDER_SITE_OTHER)
Admission: RE | Admit: 2018-07-22 | Discharge: 2018-07-22 | Disposition: A | Discharge status: Home / Self Care | Payer: Medicare Other | Source: Ambulatory Visit | Attending: Pulmonary Disease | Admitting: Pulmonary Disease

## 2018-07-22 DIAGNOSIS — J439 Emphysema, unspecified: Secondary | ICD-10-CM

## 2018-07-24 NOTE — Progress Notes (Signed)
LMTCB

## 2018-07-29 DIAGNOSIS — Z940 Kidney transplant status: Secondary | ICD-10-CM | POA: Diagnosis not present

## 2018-08-01 NOTE — Progress Notes (Signed)
lmom 

## 2018-08-14 ENCOUNTER — Ambulatory Visit: Payer: Medicare Other | Admitting: Pulmonary Disease

## 2018-09-30 ENCOUNTER — Other Ambulatory Visit: Payer: Self-pay

## 2018-09-30 ENCOUNTER — Inpatient Hospital Stay (HOSPITAL_COMMUNITY)
Admission: EM | Admit: 2018-09-30 | Discharge: 2018-10-05 | DRG: 698 | Disposition: A | Discharge status: Home Health | Payer: Medicare Other | Attending: Internal Medicine | Admitting: Internal Medicine

## 2018-09-30 ENCOUNTER — Emergency Department (HOSPITAL_COMMUNITY): Payer: Medicare Other

## 2018-09-30 ENCOUNTER — Encounter (HOSPITAL_COMMUNITY): Payer: Self-pay

## 2018-09-30 DIAGNOSIS — L8990 Pressure ulcer of unspecified site, unspecified stage: Secondary | ICD-10-CM

## 2018-09-30 DIAGNOSIS — N401 Enlarged prostate with lower urinary tract symptoms: Secondary | ICD-10-CM | POA: Diagnosis present

## 2018-09-30 DIAGNOSIS — E8809 Other disorders of plasma-protein metabolism, not elsewhere classified: Secondary | ICD-10-CM

## 2018-09-30 DIAGNOSIS — J984 Other disorders of lung: Secondary | ICD-10-CM | POA: Diagnosis not present

## 2018-09-30 DIAGNOSIS — N400 Enlarged prostate without lower urinary tract symptoms: Secondary | ICD-10-CM | POA: Diagnosis present

## 2018-09-30 DIAGNOSIS — E43 Unspecified severe protein-calorie malnutrition: Secondary | ICD-10-CM | POA: Diagnosis present

## 2018-09-30 DIAGNOSIS — E860 Dehydration: Secondary | ICD-10-CM | POA: Diagnosis present

## 2018-09-30 DIAGNOSIS — Z66 Do not resuscitate: Secondary | ICD-10-CM | POA: Diagnosis not present

## 2018-09-30 DIAGNOSIS — Z79891 Long term (current) use of opiate analgesic: Secondary | ICD-10-CM

## 2018-09-30 DIAGNOSIS — Z7982 Long term (current) use of aspirin: Secondary | ICD-10-CM

## 2018-09-30 DIAGNOSIS — I499 Cardiac arrhythmia, unspecified: Secondary | ICD-10-CM | POA: Diagnosis not present

## 2018-09-30 DIAGNOSIS — Z515 Encounter for palliative care: Secondary | ICD-10-CM

## 2018-09-30 DIAGNOSIS — D62 Acute posthemorrhagic anemia: Secondary | ICD-10-CM | POA: Diagnosis not present

## 2018-09-30 DIAGNOSIS — Z7189 Other specified counseling: Secondary | ICD-10-CM

## 2018-09-30 DIAGNOSIS — Y830 Surgical operation with transplant of whole organ as the cause of abnormal reaction of the patient, or of later complication, without mention of misadventure at the time of the procedure: Secondary | ICD-10-CM | POA: Diagnosis present

## 2018-09-30 DIAGNOSIS — I4819 Other persistent atrial fibrillation: Secondary | ICD-10-CM | POA: Diagnosis present

## 2018-09-30 DIAGNOSIS — R6510 Systemic inflammatory response syndrome (SIRS) of non-infectious origin without acute organ dysfunction: Secondary | ICD-10-CM | POA: Diagnosis not present

## 2018-09-30 DIAGNOSIS — R509 Fever, unspecified: Secondary | ICD-10-CM | POA: Diagnosis not present

## 2018-09-30 DIAGNOSIS — N179 Acute kidney failure, unspecified: Secondary | ICD-10-CM | POA: Diagnosis not present

## 2018-09-30 DIAGNOSIS — L89152 Pressure ulcer of sacral region, stage 2: Secondary | ICD-10-CM | POA: Diagnosis present

## 2018-09-30 DIAGNOSIS — Z77090 Contact with and (suspected) exposure to asbestos: Secondary | ICD-10-CM | POA: Diagnosis present

## 2018-09-30 DIAGNOSIS — Z85528 Personal history of other malignant neoplasm of kidney: Secondary | ICD-10-CM

## 2018-09-30 DIAGNOSIS — Z881 Allergy status to other antibiotic agents status: Secondary | ICD-10-CM

## 2018-09-30 DIAGNOSIS — I959 Hypotension, unspecified: Secondary | ICD-10-CM | POA: Diagnosis not present

## 2018-09-30 DIAGNOSIS — Z905 Acquired absence of kidney: Secondary | ICD-10-CM

## 2018-09-30 DIAGNOSIS — I444 Left anterior fascicular block: Secondary | ICD-10-CM | POA: Diagnosis not present

## 2018-09-30 DIAGNOSIS — R338 Other retention of urine: Secondary | ICD-10-CM | POA: Diagnosis present

## 2018-09-30 DIAGNOSIS — Z79899 Other long term (current) drug therapy: Secondary | ICD-10-CM

## 2018-09-30 DIAGNOSIS — I471 Supraventricular tachycardia: Secondary | ICD-10-CM | POA: Diagnosis present

## 2018-09-30 DIAGNOSIS — N183 Chronic kidney disease, stage 3 (moderate): Secondary | ICD-10-CM | POA: Diagnosis present

## 2018-09-30 DIAGNOSIS — R339 Retention of urine, unspecified: Secondary | ICD-10-CM

## 2018-09-30 DIAGNOSIS — N2581 Secondary hyperparathyroidism of renal origin: Secondary | ICD-10-CM | POA: Diagnosis present

## 2018-09-30 DIAGNOSIS — Z7952 Long term (current) use of systemic steroids: Secondary | ICD-10-CM

## 2018-09-30 DIAGNOSIS — N136 Pyonephrosis: Secondary | ICD-10-CM | POA: Diagnosis present

## 2018-09-30 DIAGNOSIS — J449 Chronic obstructive pulmonary disease, unspecified: Secondary | ICD-10-CM | POA: Diagnosis present

## 2018-09-30 DIAGNOSIS — M1990 Unspecified osteoarthritis, unspecified site: Secondary | ICD-10-CM | POA: Diagnosis present

## 2018-09-30 DIAGNOSIS — N184 Chronic kidney disease, stage 4 (severe): Secondary | ICD-10-CM

## 2018-09-30 DIAGNOSIS — T8619 Other complication of kidney transplant: Principal | ICD-10-CM | POA: Diagnosis present

## 2018-09-30 DIAGNOSIS — R6511 Systemic inflammatory response syndrome (SIRS) of non-infectious origin with acute organ dysfunction: Secondary | ICD-10-CM | POA: Diagnosis present

## 2018-09-30 DIAGNOSIS — N390 Urinary tract infection, site not specified: Secondary | ICD-10-CM

## 2018-09-30 DIAGNOSIS — R627 Adult failure to thrive: Secondary | ICD-10-CM | POA: Diagnosis present

## 2018-09-30 DIAGNOSIS — Z86010 Personal history of colonic polyps: Secondary | ICD-10-CM

## 2018-09-30 DIAGNOSIS — R0902 Hypoxemia: Secondary | ICD-10-CM | POA: Diagnosis not present

## 2018-09-30 DIAGNOSIS — Z8673 Personal history of transient ischemic attack (TIA), and cerebral infarction without residual deficits: Secondary | ICD-10-CM

## 2018-09-30 DIAGNOSIS — K921 Melena: Secondary | ICD-10-CM | POA: Diagnosis present

## 2018-09-30 DIAGNOSIS — Z7901 Long term (current) use of anticoagulants: Secondary | ICD-10-CM

## 2018-09-30 DIAGNOSIS — Z8249 Family history of ischemic heart disease and other diseases of the circulatory system: Secondary | ICD-10-CM

## 2018-09-30 DIAGNOSIS — F03918 Unspecified dementia, unspecified severity, with other behavioral disturbance: Secondary | ICD-10-CM

## 2018-09-30 DIAGNOSIS — F0391 Unspecified dementia with behavioral disturbance: Secondary | ICD-10-CM | POA: Diagnosis present

## 2018-09-30 DIAGNOSIS — I2510 Atherosclerotic heart disease of native coronary artery without angina pectoris: Secondary | ICD-10-CM | POA: Diagnosis present

## 2018-09-30 DIAGNOSIS — D631 Anemia in chronic kidney disease: Secondary | ICD-10-CM | POA: Diagnosis present

## 2018-09-30 DIAGNOSIS — I129 Hypertensive chronic kidney disease with stage 1 through stage 4 chronic kidney disease, or unspecified chronic kidney disease: Secondary | ICD-10-CM | POA: Diagnosis not present

## 2018-09-30 DIAGNOSIS — R64 Cachexia: Secondary | ICD-10-CM | POA: Diagnosis present

## 2018-09-30 DIAGNOSIS — I714 Abdominal aortic aneurysm, without rupture: Secondary | ICD-10-CM | POA: Diagnosis present

## 2018-09-30 DIAGNOSIS — E785 Hyperlipidemia, unspecified: Secondary | ICD-10-CM | POA: Diagnosis present

## 2018-09-30 DIAGNOSIS — Z882 Allergy status to sulfonamides status: Secondary | ICD-10-CM

## 2018-09-30 DIAGNOSIS — Z9079 Acquired absence of other genital organ(s): Secondary | ICD-10-CM

## 2018-09-30 LAB — CBC WITH DIFFERENTIAL/PLATELET
Abs Immature Granulocytes: 0.02 10*3/uL (ref 0.00–0.07)
BASOS ABS: 0 10*3/uL (ref 0.0–0.1)
Basophils Relative: 0 %
EOS ABS: 0 10*3/uL (ref 0.0–0.5)
EOS PCT: 0 %
HEMATOCRIT: 32 % — AB (ref 39.0–52.0)
HEMOGLOBIN: 9.7 g/dL — AB (ref 13.0–17.0)
Immature Granulocytes: 0 %
LYMPHS ABS: 1.2 10*3/uL (ref 0.7–4.0)
LYMPHS PCT: 20 %
MCH: 31 pg (ref 26.0–34.0)
MCHC: 30.3 g/dL (ref 30.0–36.0)
MCV: 102.2 fL — AB (ref 80.0–100.0)
MONO ABS: 0.3 10*3/uL (ref 0.1–1.0)
Monocytes Relative: 5 %
NRBC: 0 % (ref 0.0–0.2)
Neutro Abs: 4.2 10*3/uL (ref 1.7–7.7)
Neutrophils Relative %: 75 %
Platelets: 219 10*3/uL (ref 150–400)
RBC: 3.13 MIL/uL — ABNORMAL LOW (ref 4.22–5.81)
RDW: 16.8 % — AB (ref 11.5–15.5)
WBC: 5.7 10*3/uL (ref 4.0–10.5)

## 2018-09-30 LAB — PROTIME-INR
INR: 1.22
Prothrombin Time: 15.3 seconds — ABNORMAL HIGH (ref 11.4–15.2)

## 2018-09-30 LAB — COMPREHENSIVE METABOLIC PANEL
ALBUMIN: 2 g/dL — AB (ref 3.5–5.0)
ALT: 14 U/L (ref 0–44)
AST: 24 U/L (ref 15–41)
Alkaline Phosphatase: 76 U/L (ref 38–126)
Anion gap: 11 (ref 5–15)
BUN: 53 mg/dL — AB (ref 8–23)
CHLORIDE: 103 mmol/L (ref 98–111)
CO2: 25 mmol/L (ref 22–32)
CREATININE: 2.49 mg/dL — AB (ref 0.61–1.24)
Calcium: 9.1 mg/dL (ref 8.9–10.3)
GFR calc Af Amer: 26 mL/min — ABNORMAL LOW (ref >60)
GFR, EST NON AFRICAN AMERICAN: 22 mL/min — AB (ref >60)
GLUCOSE: 97 mg/dL (ref 70–99)
POTASSIUM: 4.9 mmol/L (ref 3.5–5.1)
SODIUM: 139 mmol/L (ref 135–145)
Total Bilirubin: 1.1 mg/dL (ref 0.3–1.2)
Total Protein: 6.9 g/dL (ref 6.5–8.1)

## 2018-09-30 LAB — URINALYSIS, ROUTINE W REFLEX MICROSCOPIC
BILIRUBIN URINE: NEGATIVE
GLUCOSE, UA: NEGATIVE mg/dL
Ketones, ur: NEGATIVE mg/dL
NITRITE: NEGATIVE
PROTEIN: 30 mg/dL — AB
Specific Gravity, Urine: 1.01 (ref 1.005–1.030)
pH: 7 (ref 5.0–8.0)

## 2018-09-30 LAB — I-STAT TROPONIN, ED: Troponin i, poc: 0.02 ng/mL (ref 0.00–0.08)

## 2018-09-30 LAB — TSH: TSH: 3.369 u[IU]/mL (ref 0.350–4.500)

## 2018-09-30 LAB — INFLUENZA PANEL BY PCR (TYPE A & B)
Influenza A By PCR: NEGATIVE
Influenza B By PCR: NEGATIVE

## 2018-09-30 LAB — PHOSPHORUS: Phosphorus: 3 mg/dL (ref 2.5–4.6)

## 2018-09-30 LAB — I-STAT CG4 LACTIC ACID, ED: LACTIC ACID, VENOUS: 2.75 mmol/L — AB (ref 0.5–1.9)

## 2018-09-30 LAB — SODIUM, URINE, RANDOM: Sodium, Ur: 53 mmol/L

## 2018-09-30 LAB — URIC ACID: Uric Acid, Serum: 8.8 mg/dL — ABNORMAL HIGH (ref 3.7–8.6)

## 2018-09-30 LAB — OSMOLALITY, URINE: Osmolality, Ur: 351 mOsm/kg (ref 300–900)

## 2018-09-30 LAB — CREATININE, URINE, RANDOM: Creatinine, Urine: 75.61 mg/dL

## 2018-09-30 LAB — ABO/RH: ABO/RH(D): O POS

## 2018-09-30 LAB — MAGNESIUM: Magnesium: 1.9 mg/dL (ref 1.7–2.4)

## 2018-09-30 LAB — OSMOLALITY: Osmolality: 307 mOsm/kg — ABNORMAL HIGH (ref 275–295)

## 2018-09-30 MED ORDER — LACTATED RINGERS IV SOLN
INTRAVENOUS | Status: AC
  Administered 2018-09-30 – 2018-10-01 (×2): via INTRAVENOUS

## 2018-09-30 MED ORDER — ASPIRIN 81 MG PO CHEW
81.0000 mg | CHEWABLE_TABLET | Freq: Every day | ORAL | Status: DC
  Administered 2018-10-01 – 2018-10-05 (×5): 81 mg via ORAL
  Filled 2018-09-30 (×5): qty 1

## 2018-09-30 MED ORDER — APIXABAN 2.5 MG PO TABS
2.5000 mg | ORAL_TABLET | Freq: Two times a day (BID) | ORAL | Status: DC
  Administered 2018-09-30 – 2018-10-01 (×3): 2.5 mg via ORAL
  Filled 2018-09-30 (×6): qty 1

## 2018-09-30 MED ORDER — SODIUM CHLORIDE 0.9 % IV SOLN
1.0000 g | Freq: Once | INTRAVENOUS | Status: AC
  Administered 2018-09-30: 1 g via INTRAVENOUS
  Filled 2018-09-30: qty 10

## 2018-09-30 MED ORDER — DOCUSATE SODIUM 100 MG PO CAPS
200.0000 mg | ORAL_CAPSULE | Freq: Every day | ORAL | Status: DC | PRN
  Administered 2018-10-01 – 2018-10-04 (×2): 200 mg via ORAL
  Filled 2018-09-30 (×2): qty 2

## 2018-09-30 MED ORDER — ACETAMINOPHEN 325 MG PO TABS
650.0000 mg | ORAL_TABLET | Freq: Four times a day (QID) | ORAL | Status: DC | PRN
  Administered 2018-10-02: 650 mg via ORAL
  Filled 2018-09-30 (×2): qty 2

## 2018-09-30 MED ORDER — IPRATROPIUM-ALBUTEROL 0.5-2.5 (3) MG/3ML IN SOLN
3.0000 mL | Freq: Four times a day (QID) | RESPIRATORY_TRACT | Status: DC
  Administered 2018-10-01 (×3): 3 mL via RESPIRATORY_TRACT
  Filled 2018-09-30 (×4): qty 3

## 2018-09-30 MED ORDER — CYCLOSPORINE MODIFIED (NEORAL) 25 MG PO CAPS
125.0000 mg | ORAL_CAPSULE | Freq: Two times a day (BID) | ORAL | Status: DC
  Administered 2018-09-30 – 2018-10-05 (×10): 125 mg via ORAL
  Filled 2018-09-30 (×10): qty 5

## 2018-09-30 MED ORDER — SODIUM CHLORIDE 0.9 % IV BOLUS
500.0000 mL | Freq: Once | INTRAVENOUS | Status: AC
  Administered 2018-09-30: 500 mL via INTRAVENOUS

## 2018-09-30 MED ORDER — DILTIAZEM HCL ER COATED BEADS 180 MG PO CP24
180.0000 mg | ORAL_CAPSULE | Freq: Every day | ORAL | Status: DC
  Administered 2018-10-01 – 2018-10-05 (×5): 180 mg via ORAL
  Filled 2018-09-30 (×5): qty 1

## 2018-09-30 MED ORDER — CYCLOSPORINE MODIFIED (NEORAL) 25 MG PO CAPS: 25.0000 mg | ORAL_CAPSULE | Freq: Two times a day (BID) | ORAL | Status: DC

## 2018-09-30 MED ORDER — CYCLOSPORINE MODIFIED (NEORAL) 100 MG PO CAPS: 100.0000 mg | ORAL_CAPSULE | Freq: Two times a day (BID) | ORAL | Status: DC

## 2018-09-30 MED ORDER — PRO-STAT SUGAR FREE PO LIQD
30.0000 mL | Freq: Three times a day (TID) | ORAL | Status: DC
  Administered 2018-10-01 – 2018-10-02 (×4): 30 mL via ORAL
  Filled 2018-09-30 (×4): qty 30

## 2018-09-30 MED ORDER — MYCOPHENOLATE SODIUM 180 MG PO TBEC
180.0000 mg | DELAYED_RELEASE_TABLET | Freq: Two times a day (BID) | ORAL | Status: DC
  Administered 2018-09-30 – 2018-10-05 (×10): 180 mg via ORAL
  Filled 2018-09-30 (×10): qty 1

## 2018-09-30 MED ORDER — PREDNISONE 5 MG PO TABS
5.0000 mg | ORAL_TABLET | Freq: Every day | ORAL | Status: DC
  Administered 2018-10-01 – 2018-10-05 (×5): 5 mg via ORAL
  Filled 2018-09-30 (×5): qty 1

## 2018-09-30 MED ORDER — ENSURE ENLIVE PO LIQD
237.0000 mL | Freq: Three times a day (TID) | ORAL | Status: DC
  Administered 2018-10-01 – 2018-10-05 (×10): 237 mL via ORAL

## 2018-09-30 MED ORDER — FAMOTIDINE 20 MG PO TABS
20.0000 mg | ORAL_TABLET | Freq: Every day | ORAL | Status: DC
  Administered 2018-09-30 – 2018-10-04 (×5): 20 mg via ORAL
  Filled 2018-09-30 (×5): qty 1

## 2018-09-30 MED ORDER — ONDANSETRON HCL 4 MG/2ML IJ SOLN: 4.0000 mg | Freq: Four times a day (QID) | INTRAMUSCULAR | Status: DC | PRN

## 2018-09-30 MED ORDER — OSELTAMIVIR PHOSPHATE 75 MG PO CAPS
75.0000 mg | ORAL_CAPSULE | Freq: Once | ORAL | Status: AC
  Administered 2018-09-30: 75 mg via ORAL
  Filled 2018-09-30: qty 1

## 2018-09-30 NOTE — Progress Notes (Signed)
ANTICOAGULATION CONSULT NOTE - Initial Consult  Pharmacy Consult for apixaban Indication: atrial fibrillation  Allergies  Allergen Reactions  . Ciprofloxacin Rash  . Clindamycin/Lincomycin Rash  . Levofloxacin Rash    unknown  . Septra [Sulfamethoxazole-Trimethoprim] Rash    Patient Measurements: Height: 6\' 3"  (190.5 cm) Weight: 134 lb (60.8 kg) IBW/kg (Calculated) : 84.5  Vital Signs: Temp: 98.4 F (36.9 C) (01/20 1515) Temp Source: Rectal (01/20 1515) BP: 110/86 (01/20 1645) Pulse Rate: 88 (01/20 1515)  Labs: Recent Labs    09/30/18 1500  HGB 9.7*  HCT 32.0*  PLT 219  LABPROT 15.3*  INR 1.22  CREATININE 2.49*    Estimated Creatinine Clearance: 17.6 mL/min (A) (by C-G formula based on SCr of 2.49 mg/dL (H)).   Medical History: Past Medical History:  Diagnosis Date  . Anemia in CKD (chronic kidney disease)   . Arthritis   . Bilateral hydrocele   . CKD (chronic kidney disease), stage II    nephrologist-  deterding  . Diverticulosis   . Dysuria   . H/O kidney transplant    right 1980/  left 1994 with right transplanted kidney removal  . Hematuria   . History of adenomatous polyp of colon    VILLOUS ADENOMA POLYP  . History of asbestos exposure   . History of condyloma acuminatum    genital  . 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 History of renal cell carcinoma    S/P  BILATERAL NEPHRECTOMY AND RENAL TRANSPLANTS  . History of small bowel obstruction    12/ 2006  due to adhesions -- resolved without surgerical intervention  . History of TIA (transient ischemic attack)   . Hyperlipidemia   . Hypertension   . Proteinuria   . Secondary hyperparathyroidism, renal ([place])   . Wears glasses     Medications:  See electronic med rec  Assessment: 85 y.o. M presents with fever. Pt on apixaban PTA for in afib - last dose taken this morning per pt. To continue apixaban upon admission.  Hgb down to 9.7 on admission - noted with  chronic anemia, Plt wnl. Noted pt also with CKD stage 4.  Goal of Therapy:  Prevention of CVA Monitor platelets by anticoagulation protocol: Yes   Plan:  Continue Apixaban 2.5mg  po BID Will f/u CBC  Sherlon Handing, PharmD, BCPS Clinical pharmacist  **Pharmacist phone directory can now be found on [email or web address] ([email or web address]).  Listed under [place]. 09/30/2018,5:38 PM

## 2018-09-30 NOTE — ED Triage Notes (Signed)
Pt arrives to ED from home with complaints of fever of 102 since this afternon. EMS reports pt's caregiver called; pt states he had a stuffy nose since yesterday. EMS gave 1000mg  tylenol en route. Pt placed in position of comfort with bed locked and lowered, call bell in reach.

## 2018-09-30 NOTE — H&P (Signed)
TRH H&P   Patient Demographics:    [name] [name], is a 85 y.o. male  MRN: [id number]   DOB - Feb 21, 1928  Admit Date - 09/30/2018  Outpatient Primary MD for the patient is Deterding, Jeneen Rinks, MD  Outpatient Specialists: Dr Deterding    Patient coming from: Home  Chief Complaint  Patient presents with  . Fever      HPI:    [name] [name]  is a 85 y.o. male, with history of kidney transplant several decades ago, CKD 4, paroxysmal atrial fibrillation [place] vas 2 score of greater than 3 on Eliquis, hypertension, dyslipidemia, anemia of chronic disease, chronic weakness and deconditioning with moderate to severe protein calorie malnutrition, lives at home with his daughter and is minimally active and walks inside the house with a cane is brought in with few days history of feeling poorly, patient apparently was complaining of some sore throat and chills since last night, he is also not been drinking and eating well for the last day.    EMS was called today and they found him to have a temp of 102, he was brought to the ER where initial work-up including chest x-ray, UA and influenza screen were unremarkable.  He was found to have ARF and I was requested to provide further care.    Review of systems:    A full 10 point Review of Systems was done, except as stated above, all other Review of Systems were negative.   With Past History of the following :    Past Medical History:  Diagnosis Date  . Anemia in CKD (chronic kidney disease)   . Arthritis   . Bilateral hydrocele   . CKD (chronic kidney disease), stage II    nephrologist-  deterding  . Diverticulosis   . Dysuria   . H/O kidney transplant    right 1980/   left 1994 with right transplanted kidney removal  . Hematuria   . History of adenomatous polyp of colon    VILLOUS ADENOMA POLYP  . History of asbestos exposure   . History of condyloma acuminatum    genital  . 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 History of renal cell carcinoma    S/P  BILATERAL NEPHRECTOMY AND RENAL TRANSPLANTS  . History of  small bowel obstruction    12/ 2006  due to adhesions -- resolved without surgerical intervention  . History of TIA (transient ischemic attack)   . Hyperlipidemia   . Hypertension   . Proteinuria   . Secondary hyperparathyroidism, renal ([place])   . Wears glasses       Past Surgical History:  Procedure Laterality Date  . COLONOSCOPY  10/17/2012   Procedure: COLONOSCOPY;  Surgeon: Beryle Beams, MD;  Location: [facility];  Service: Endoscopy;  Laterality: N/A;  . CYSTOSCOPY N/A 07/13/2015   Procedure: CYSTOSCOPY;  Surgeon: Irine Seal, MD;  [facility];  Service: Urology;  Laterality: N/A;  . HEMICOLECTOMY Right 1995   adenoma polyp  . HYDROCELE EXCISION Bilateral 07/13/2015   Procedure: RIGHT HYDROCELECTOMY, ADULT DRAINAGE OF LEFT HYDROCELE;  Surgeon: Irine Seal, MD;  Location: [facility];  Service: Urology;  Laterality: Bilateral;  . KIDNEY TRANSPLANT  right 1980; left 1994---  [facility]   right kidney removed 5170/  left kidney removed 1994      Social History:     Social History   Tobacco Use  . Smoking status: Never Smoker  . Smokeless tobacco: Never Used  Substance Use Topics  . Alcohol use: No         Family History :  He denies any family history of CAD at young age   Home Medications:   Prior to Admission medications   Medication Sig Start Date End Date Taking? Authorizing Provider  aspirin 81 MG chewable tablet Chew 1 tablet (81 mg total) by mouth daily. 03/30/18   Regalado, Belkys A, MD  cycloSPORINE modified (NEORAL) 100 MG capsule Take 100 mg by  mouth 2 (two) times daily. 125 mg twice daily    [provider]  cycloSPORINE modified (NEORAL) 25 MG capsule Take 25 mg by mouth 2 (two) times daily. 125 mg twice daily    [provider]  diltiazem (CARDIZEM CD) 180 MG 24 hr capsule Take 1 capsule (180 mg total) by mouth daily. 04/23/18   Lendon Colonel, NP  docusate sodium (COLACE) 100 MG capsule Take 200 mg by mouth daily as needed for mild constipation.    [provider]  famotidine (PEPCID) 20 MG tablet Take 1 tablet (20 mg total) by mouth at bedtime. 03/29/18   Regalado, Belkys A, MD  feeding supplement, ENSURE ENLIVE, (ENSURE ENLIVE) LIQD Take 237 mLs by mouth 3 (three) times daily between meals. 03/29/18   Regalado, Belkys A, MD  HYDROcodone-acetaminophen (NORCO) 5-325 MG tablet Take 1 tablet by mouth every 6 (six) hours as needed for moderate pain. 07/13/15   Irine Seal, MD  mycophenolate (MYFORTIC) 180 MG EC tablet Take 180 mg by mouth 2 (two) times daily.    [provider]  predniSONE (DELTASONE) 5 MG tablet Take 5 mg by mouth daily with breakfast.    [provider]     Allergies:     Allergies  Allergen Reactions  . Ciprofloxacin Rash  . Clindamycin/Lincomycin Rash  . Levofloxacin Rash    unknown  . Septra [Sulfamethoxazole-Trimethoprim] Rash     Physical Exam:   Vitals  Blood pressure 110/86, pulse 88, temperature 98.4 F (36.9 C), temperature source Rectal, resp. rate 14, height 6\' 3"  (1.905 m), weight 60.8 kg, SpO2 100 %.   1. General frail elderly African-American gentleman who appears dehydrated lying in hospital bed in no apparent discomfort  2. Normal affect and insight, Not Suicidal or Homicidal, Awake  Alert, Oriented X 3.  3. No F.N deficits, ALL C.Nerves Intact, Strength 5/5 all 4 extremities, Sensation intact all 4 extremities, Plantars down going.  4. Ears and Eyes appear Normal, Conjunctivae clear, PERRLA.  Dry oral Mucosa.  5. Supple Neck, No JVD, No  cervical lymphadenopathy appriciated, No Carotid Bruits.  6. Symmetrical Chest wall movement, Good air movement bilaterally, CTAB.  7. RRR, No Gallops, Rubs or Murmurs, No Parasternal Heave.  8. Positive Bowel Sounds, Abdomen Soft, No tenderness, No organomegaly appriciated,No rebound -guarding or rigidity.  9.  No Cyanosis, Normal Skin Turgor, No Skin Rash or Bruise.  10. Good muscle tone,  joints appear normal , no effusions, Normal ROM.  11. No Palpable Lymph Nodes in Neck or Axillae      Data Review:    CBC Recent Labs  Lab 09/30/18 1500  WBC 5.7  HGB 9.7*  HCT 32.0*  PLT 219  MCV 102.2*  MCH 31.0  MCHC 30.3  RDW 16.8*  LYMPHSABS 1.2  MONOABS 0.3  EOSABS 0.0  BASOSABS 0.0   ------------------------------------------------------------------------------------------------------------------  Chemistries  Recent Labs  Lab 09/30/18 1500 09/30/18 1508  NA 139  --   K 4.9  --   CL 103  --   CO2 25  --   GLUCOSE 97  --   BUN 53*  --   CREATININE 2.49*  --   CALCIUM 9.1  --   MG  --  1.9  AST 24  --   ALT 14  --   ALKPHOS 76  --   BILITOT 1.1  --    ------------------------------------------------------------------------------------------------------------------ estimated creatinine clearance is 17.6 mL/min (A) (by C-G formula based on SCr of 2.49 mg/dL (H)). ------------------------------------------------------------------------------------------------------------------ No results for input(s): TSH, T4TOTAL, T3FREE, THYROIDAB in the last 72 hours.  Invalid input(s): FREET3  Coagulation profile Recent Labs  Lab 09/30/18 1500  INR 1.22   ------------------------------------------------------------------------------------------------------------------- No results for input(s): DDIMER in the last 72 hours. -------------------------------------------------------------------------------------------------------------------  Cardiac Enzymes No results for  input(s): CKMB, TROPONINI, MYOGLOBIN in the last 168 hours.  Invalid input(s): CK ------------------------------------------------------------------------------------------------------------------ No results found for: BNP   ---------------------------------------------------------------------------------------------------------------  Urinalysis    Component Value Date/Time   COLORURINE YELLOW 03/26/2018 1940   APPEARANCEUR CLEAR 03/26/2018 1940   LABSPEC 1.010 03/26/2018 1940   PHURINE 5.0 03/26/2018 1940   GLUCOSEU NEGATIVE 03/26/2018 1940   HGBUR MODERATE (A) 03/26/2018 1940   BILIRUBINUR NEGATIVE 03/26/2018 [place] NEGATIVE 03/26/2018 1940   PROTEINUR 100 (A) 03/26/2018 1940   NITRITE NEGATIVE 03/26/2018 1940   LEUKOCYTESUR NEGATIVE 03/26/2018 1940    ----------------------------------------------------------------------------------------------------------------   Imaging Results:    Dg Chest Port 1 View  Result Date: 09/30/2018 CLINICAL DATA:  Possible seizure today. Fever of 102 since this afternoon. EXAM: PORTABLE CHEST 1 VIEW COMPARISON:  03/26/2018 and chest CT dated 07/22/2018. FINDINGS: Normal sized heart. Tortuous and mildly calcified thoracic aorta. The lungs remain hyperexpanded with bullous changes and previously demonstrated bilateral calcified pleural plaques. Careful poorly defined rounded nodular density at the left lung base. The interstitial markings remain mildly prominent. Diffuse osteopenia. IMPRESSION: 1. Interval poorly defined rounded nodular density at the left lung base. This could represent a true nodule or rounded focus of pneumonia or atelectasis. This could be further evaluated with a chest CT with contrast. 2. Stable changes of COPD and bilateral calcified pleural plaques compatible with previous asbestos exposure. Electronically Signed   By: Claudie Revering M.D.   On: 09/30/2018 15:24    My  personal review of EKG: Rhythm NSR,   no Acute ST  changes   Assessment & Plan:      1.  Dehydration with ARF on CKD 4.  Baseline creatinine close to 1.8, check UA along with urine electrolytes, hydrate, avoid nephrotoxins and repeat BMP in the morning.  Would also check bladder scan to rule out any obstruction.  2.  Sore throat and reported fevers.  Could have viral URI, influenza screen is negative, he did have mild dysuria hence I will cover him with Rocephin 1 dose, UA is pending.  3.  History of kidney transplant.  Supportive care continue home medications.  Outpatient follow-up with his primary nephrologist Dr. Jimmy Footman post discharge.  4.  Poor appetite, deconditioning and weakness.  He appears to have at least moderate to severe protein calorie malnutrition.  Will place him on pro-stat, liberalize diet, hydrate, PT eval.  Check TSH.  5.  Anemia of chronic disease.  Due to underlying CKD 4.  Supportive care.  6.  Paroxysmal atrial fibrillation with [place] vas 2 score of at least 3.  Continue diltiazem and Eliquis.  Pharmacy consulted to dose Eliquis.    DVT Prophylaxis Eliquis  AM Labs Ordered, also please review Full Orders  Family Communication: Admission, patients condition and plan of care including tests being ordered have been discussed with the patient  who indicates understanding and agree with the plan and Code Status.  Code Status Full  Likely DC to  TBD  Condition GUARDED    Consults called: None    Admission status: Obs    Time spent in minutes : 35   Lala Lund M.D on 09/30/2018 at 5:47 PM  To page go to [email or web address] - password [facility]

## 2018-09-30 NOTE — ED Provider Notes (Signed)
[facility] EMERGENCY DEPARTMENT Provider Note   CSN: [id number] Arrival date & time: 09/30/18  1444     History   Chief Complaint Chief Complaint  Patient presents with  . Fever    HPI [name] [name] is a 85 y.o. male.  85 year old male with prior medical history as detailed below presents for evaluation of increasing weakness.  EMS transported patient to the ED today.  EMS reports a temperature of the patient in route was 102.  He was given a dose of Tylenol prior to arrival.  Patient complains of feeling weak and tired.  Additional history is obtained from his daughter who the patient resides with.  Daughter reports increasing weakness over the last 3 to 4 days.  She denies nausea or vomiting.  She denies significant URI symptoms.  Patient denies current chest pain, shortness of breath, nausea, vomiting, abdominal pain, or other acute complaint.  The history is provided by the patient, medical records and a relative.  Fever  Max temp prior to arrival:  102 Temp source:  Oral Severity:  Moderate Onset quality:  Gradual Duration:  4 days Timing:  Constant Progression:  Worsening Chronicity:  New Relieved by:  Nothing Worsened by:  Nothing Ineffective treatments:  None tried   Past Medical History:  Diagnosis Date  . Anemia in CKD (chronic kidney disease)   . Arthritis   . Bilateral hydrocele   . CKD (chronic kidney disease), stage II    nephrologist-  deterding  . Diverticulosis   . Dysuria   . H/O kidney transplant    right 1980/  left 1994 with right transplanted kidney removal  . Hematuria   . History of adenomatous polyp of colon    VILLOUS ADENOMA POLYP  . History of asbestos exposure   . History of condyloma acuminatum    genital  . 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 History of renal cell carcinoma    S/P  BILATERAL NEPHRECTOMY AND RENAL TRANSPLANTS  . History of small bowel obstruction    12/ 2006  due  to adhesions -- resolved without surgerical intervention  . History of TIA (transient ischemic attack)   . Hyperlipidemia   . Hypertension   . Proteinuria   . Secondary hyperparathyroidism, renal ([place])   . Wears glasses     Patient Active Problem List   Diagnosis Date Noted  . Bone lesion 03/27/2018  . AAA (abdominal aortic aneurysm) ([place]) 03/27/2018  . BPH (benign prostatic hyperplasia) 03/27/2018  . Weight loss 03/27/2018  . Hypotension 03/27/2018  . Unspecified atrial fibrillation ([place]) 03/27/2018  . Protein-calorie malnutrition, severe 03/27/2018  . SIRS (systemic inflammatory response syndrome) ([place]) 03/26/2018    Past Surgical History:  Procedure Laterality Date  . COLONOSCOPY  10/17/2012   Procedure: COLONOSCOPY;  Surgeon: Beryle Beams, MD;  Location: [facility];  Service: Endoscopy;  Laterality: N/A;  . CYSTOSCOPY N/A 07/13/2015   Procedure: CYSTOSCOPY;  Surgeon: Irine Seal, MD;  Location: [facility];  Service: Urology;  Laterality: N/A;  . HEMICOLECTOMY Right 1995   adenoma polyp  . HYDROCELE EXCISION Bilateral 07/13/2015   Procedure: RIGHT HYDROCELECTOMY, ADULT DRAINAGE OF LEFT HYDROCELE;  Surgeon: Irine Seal, MD;  Location: [facility];  Service: Urology;  Laterality: Bilateral;  . KIDNEY TRANSPLANT  right 1980; left 1994---  [facility]   right kidney removed 1989/  left kidney removed 1994  Home Medications    Prior to Admission medications   Medication Sig Start Date End Date Taking? Authorizing Provider  apixaban (ELIQUIS) 2.5 MG TABS tablet Take 1 tablet (2.5 mg total) by mouth 2 (two) times daily. 03/29/18   Regalado, Jerald Kief A, MD  aspirin 81 MG chewable tablet Chew 1 tablet (81 mg total) by mouth daily. 03/30/18   Regalado, Belkys A, MD  cycloSPORINE modified (NEORAL) 100 MG capsule Take 100 mg by mouth 2 (two) times daily. 125 mg twice daily    [provider]  cycloSPORINE modified (NEORAL) 25 MG capsule Take 25  mg by mouth 2 (two) times daily. 125 mg twice daily    [provider]  diltiazem (CARDIZEM CD) 180 MG 24 hr capsule Take 1 capsule (180 mg total) by mouth daily. 04/23/18   Lendon Colonel, NP  docusate sodium (COLACE) 100 MG capsule Take 200 mg by mouth daily as needed for mild constipation.    [provider]  famotidine (PEPCID) 20 MG tablet Take 1 tablet (20 mg total) by mouth at bedtime. 03/29/18   Regalado, Belkys A, MD  feeding supplement, ENSURE ENLIVE, (ENSURE ENLIVE) LIQD Take 237 mLs by mouth 3 (three) times daily between meals. 03/29/18   Regalado, Belkys A, MD  HYDROcodone-acetaminophen (NORCO) 5-325 MG tablet Take 1 tablet by mouth every 6 (six) hours as needed for moderate pain. 07/13/15   Irine Seal, MD  mycophenolate (MYFORTIC) 180 MG EC tablet Take 180 mg by mouth 2 (two) times daily.    [provider]  predniSONE (DELTASONE) 5 MG tablet Take 5 mg by mouth daily with breakfast.    [provider]    Family History History reviewed. No pertinent family history.  Social History Social History   Tobacco Use  . Smoking status: Never Smoker  . Smokeless tobacco: Never Used  Substance Use Topics  . Alcohol use: No  . Drug use: No     Allergies   Ciprofloxacin; Clindamycin/lincomycin; Levofloxacin; and Septra [sulfamethoxazole-trimethoprim]   Review of Systems Review of Systems  Constitutional: Positive for fever.  All other systems reviewed and are negative.    Physical Exam Updated Vital Signs BP (!) 108/93 (BP Location: Right Arm)   Pulse 88   Temp 98.4 F (36.9 C) (Rectal)   Resp 16   Ht 6\' 3"  (1.905 m)   Wt 60.8 kg   SpO2 100%   BMI 16.75 kg/m   Physical Exam Vitals signs and nursing note reviewed.  Constitutional:      General: He is not in acute distress.    Appearance: He is well-developed.  HENT:     Head: Normocephalic and atraumatic.  Eyes:     Conjunctiva/sclera: Conjunctivae normal.     Pupils:  Pupils are equal, round, and reactive to light.  Neck:     Musculoskeletal: Normal range of motion and neck supple.  Cardiovascular:     Rate and Rhythm: Normal rate and regular rhythm.     Heart sounds: Normal heart sounds.  Pulmonary:     Effort: Pulmonary effort is normal. No respiratory distress.     Breath sounds: Normal breath sounds.  Abdominal:     General: There is no distension.     Palpations: Abdomen is soft.     Tenderness: There is no abdominal tenderness.  Musculoskeletal: Normal range of motion.        General: No deformity.  Skin:    General: Skin is warm and dry.  Neurological:     General: No focal deficit present.     Mental Status: He is alert and oriented to person, place, and time. Mental status is at baseline.      ED Treatments / Results  Labs (all labs ordered are listed, but only abnormal results are displayed) Labs Reviewed  CBC WITH DIFFERENTIAL/PLATELET - Abnormal; Notable for the following components:      Result Value   RBC 3.13 (*)    Hemoglobin 9.7 (*)    HCT 32.0 (*)    MCV 102.2 (*)    RDW 16.8 (*)    All other components within normal limits  CULTURE, BLOOD (ROUTINE X 2)  CULTURE, BLOOD (ROUTINE X 2)  COMPREHENSIVE METABOLIC PANEL  PROTIME-INR  URINALYSIS, ROUTINE W REFLEX MICROSCOPIC  INFLUENZA PANEL BY PCR (TYPE A & B)  MAGNESIUM  PHOSPHORUS  I-STAT CHEM 8, ED  I-STAT CG4 LACTIC ACID, ED  I-STAT TROPONIN, ED  TYPE AND SCREEN    EKG EKG Interpretation  Date/Time:  Monday September 30 2018 14:51:17 EST Ventricular Rate:  96 PR Interval:    QRS Duration: 79 QT Interval:  354 QTC Calculation: 448 R Axis:   -60 Text Interpretation:  Sinus rhythm Aberrant conduction of SV complex(es) Short PR interval Left anterior fascicular block Probable left ventricular hypertrophy Abnormal T, consider ischemia, lateral leads Baseline wander in lead(s) V1 Confirmed by Dene Gentry [phone]) on 09/30/2018 3:05:09 PM   Radiology No  results found.  Procedures Procedures (including critical care time)  Medications Ordered in ED Medications - No data to display   Initial Impression / Assessment and Plan / ED Course  I have reviewed the triage vital signs and the nursing notes.  Pertinent labs & imaging results that were available during my care of the patient were reviewed by me and considered in my medical decision making (see chart for details).     MDM  Screen complete  Patient is presenting for evaluation of reported failure to thrive symptoms.  Patient's exam is suggestive of, at the very least, moderate dehydration.  Screening labs in the ED suggest that dehydration is the primary diagnosis.  Patient's creatinine is increased from his baseline.  Patient was noted to have a fever of 102 with EMS.  Upon initial evaluation in the ED his rectal temperature is 98.4.   Patient without overt evidence of overwhelming infection on initial exam. Hospitalist aware that no antibiotics were given in the ED.   Hospitalist Candiss Norse) service is aware of case and will evaluate for admission.   Final Clinical Impressions(s) / ED Diagnoses   Final diagnoses:  Acute renal failure, unspecified acute renal failure type [facility])    ED Discharge Orders    None       Valarie Merino, MD 09/30/18 [phone]

## 2018-10-01 ENCOUNTER — Inpatient Hospital Stay (HOSPITAL_COMMUNITY): Payer: Medicare Other

## 2018-10-01 DIAGNOSIS — R627 Adult failure to thrive: Secondary | ICD-10-CM | POA: Diagnosis present

## 2018-10-01 DIAGNOSIS — N401 Enlarged prostate with lower urinary tract symptoms: Secondary | ICD-10-CM | POA: Diagnosis not present

## 2018-10-01 DIAGNOSIS — T8619 Other complication of kidney transplant: Secondary | ICD-10-CM | POA: Diagnosis present

## 2018-10-01 DIAGNOSIS — Z86010 Personal history of colonic polyps: Secondary | ICD-10-CM | POA: Diagnosis not present

## 2018-10-01 DIAGNOSIS — N2581 Secondary hyperparathyroidism of renal origin: Secondary | ICD-10-CM | POA: Diagnosis present

## 2018-10-01 DIAGNOSIS — Z7982 Long term (current) use of aspirin: Secondary | ICD-10-CM | POA: Diagnosis not present

## 2018-10-01 DIAGNOSIS — D631 Anemia in chronic kidney disease: Secondary | ICD-10-CM | POA: Diagnosis present

## 2018-10-01 DIAGNOSIS — E46 Unspecified protein-calorie malnutrition: Secondary | ICD-10-CM | POA: Diagnosis not present

## 2018-10-01 DIAGNOSIS — R3914 Feeling of incomplete bladder emptying: Secondary | ICD-10-CM | POA: Diagnosis not present

## 2018-10-01 DIAGNOSIS — N3289 Other specified disorders of bladder: Secondary | ICD-10-CM | POA: Diagnosis not present

## 2018-10-01 DIAGNOSIS — Z7189 Other specified counseling: Secondary | ICD-10-CM | POA: Diagnosis not present

## 2018-10-01 DIAGNOSIS — I4819 Other persistent atrial fibrillation: Secondary | ICD-10-CM | POA: Diagnosis present

## 2018-10-01 DIAGNOSIS — R339 Retention of urine, unspecified: Secondary | ICD-10-CM | POA: Diagnosis not present

## 2018-10-01 DIAGNOSIS — M1990 Unspecified osteoarthritis, unspecified site: Secondary | ICD-10-CM | POA: Diagnosis present

## 2018-10-01 DIAGNOSIS — N136 Pyonephrosis: Secondary | ICD-10-CM | POA: Diagnosis present

## 2018-10-01 DIAGNOSIS — K921 Melena: Secondary | ICD-10-CM | POA: Diagnosis present

## 2018-10-01 DIAGNOSIS — I482 Chronic atrial fibrillation, unspecified: Secondary | ICD-10-CM | POA: Diagnosis not present

## 2018-10-01 DIAGNOSIS — Z8673 Personal history of transient ischemic attack (TIA), and cerebral infarction without residual deficits: Secondary | ICD-10-CM | POA: Diagnosis not present

## 2018-10-01 DIAGNOSIS — R350 Frequency of micturition: Secondary | ICD-10-CM | POA: Diagnosis not present

## 2018-10-01 DIAGNOSIS — R195 Other fecal abnormalities: Secondary | ICD-10-CM | POA: Diagnosis not present

## 2018-10-01 DIAGNOSIS — Y830 Surgical operation with transplant of whole organ as the cause of abnormal reaction of the patient, or of later complication, without mention of misadventure at the time of the procedure: Secondary | ICD-10-CM | POA: Diagnosis present

## 2018-10-01 DIAGNOSIS — Z515 Encounter for palliative care: Secondary | ICD-10-CM | POA: Diagnosis not present

## 2018-10-01 DIAGNOSIS — N390 Urinary tract infection, site not specified: Secondary | ICD-10-CM | POA: Diagnosis not present

## 2018-10-01 DIAGNOSIS — R6511 Systemic inflammatory response syndrome (SIRS) of non-infectious origin with acute organ dysfunction: Secondary | ICD-10-CM | POA: Diagnosis present

## 2018-10-01 DIAGNOSIS — R6510 Systemic inflammatory response syndrome (SIRS) of non-infectious origin without acute organ dysfunction: Secondary | ICD-10-CM | POA: Diagnosis not present

## 2018-10-01 DIAGNOSIS — E8809 Other disorders of plasma-protein metabolism, not elsewhere classified: Secondary | ICD-10-CM | POA: Diagnosis not present

## 2018-10-01 DIAGNOSIS — Z905 Acquired absence of kidney: Secondary | ICD-10-CM | POA: Diagnosis not present

## 2018-10-01 DIAGNOSIS — I471 Supraventricular tachycardia: Secondary | ICD-10-CM | POA: Diagnosis present

## 2018-10-01 DIAGNOSIS — L89152 Pressure ulcer of sacral region, stage 2: Secondary | ICD-10-CM | POA: Diagnosis present

## 2018-10-01 DIAGNOSIS — F0391 Unspecified dementia with behavioral disturbance: Secondary | ICD-10-CM | POA: Diagnosis present

## 2018-10-01 DIAGNOSIS — Z79891 Long term (current) use of opiate analgesic: Secondary | ICD-10-CM | POA: Diagnosis not present

## 2018-10-01 DIAGNOSIS — N179 Acute kidney failure, unspecified: Secondary | ICD-10-CM | POA: Diagnosis present

## 2018-10-01 DIAGNOSIS — E43 Unspecified severe protein-calorie malnutrition: Secondary | ICD-10-CM | POA: Diagnosis present

## 2018-10-01 DIAGNOSIS — D62 Acute posthemorrhagic anemia: Secondary | ICD-10-CM | POA: Diagnosis present

## 2018-10-01 DIAGNOSIS — Z881 Allergy status to other antibiotic agents status: Secondary | ICD-10-CM | POA: Diagnosis not present

## 2018-10-01 DIAGNOSIS — Z85528 Personal history of other malignant neoplasm of kidney: Secondary | ICD-10-CM | POA: Diagnosis not present

## 2018-10-01 DIAGNOSIS — J90 Pleural effusion, not elsewhere classified: Secondary | ICD-10-CM | POA: Diagnosis not present

## 2018-10-01 DIAGNOSIS — R338 Other retention of urine: Secondary | ICD-10-CM | POA: Diagnosis not present

## 2018-10-01 DIAGNOSIS — L8990 Pressure ulcer of unspecified site, unspecified stage: Secondary | ICD-10-CM

## 2018-10-01 DIAGNOSIS — R64 Cachexia: Secondary | ICD-10-CM | POA: Diagnosis present

## 2018-10-01 DIAGNOSIS — D638 Anemia in other chronic diseases classified elsewhere: Secondary | ICD-10-CM | POA: Diagnosis not present

## 2018-10-01 DIAGNOSIS — N1339 Other hydronephrosis: Secondary | ICD-10-CM | POA: Diagnosis not present

## 2018-10-01 DIAGNOSIS — N184 Chronic kidney disease, stage 4 (severe): Secondary | ICD-10-CM | POA: Diagnosis not present

## 2018-10-01 DIAGNOSIS — Z882 Allergy status to sulfonamides status: Secondary | ICD-10-CM | POA: Diagnosis not present

## 2018-10-01 LAB — FERRITIN: Ferritin: 335 ng/mL (ref 24–336)

## 2018-10-01 LAB — BASIC METABOLIC PANEL
ANION GAP: 9 (ref 5–15)
BUN: 51 mg/dL — ABNORMAL HIGH (ref 8–23)
CALCIUM: 8.6 mg/dL — AB (ref 8.9–10.3)
CO2: 23 mmol/L (ref 22–32)
Chloride: 106 mmol/L (ref 98–111)
Creatinine, Ser: 2.21 mg/dL — ABNORMAL HIGH (ref 0.61–1.24)
GFR calc Af Amer: 30 mL/min — ABNORMAL LOW (ref >60)
GFR calc non Af Amer: 26 mL/min — ABNORMAL LOW (ref >60)
Glucose, Bld: 101 mg/dL — ABNORMAL HIGH (ref 70–99)
Potassium: 4.5 mmol/L (ref 3.5–5.1)
Sodium: 138 mmol/L (ref 135–145)

## 2018-10-01 LAB — IRON AND TIBC
Iron: 15 ug/dL — ABNORMAL LOW (ref 45–182)
Saturation Ratios: 11 % — ABNORMAL LOW (ref 17.9–39.5)
TIBC: 136 ug/dL — ABNORMAL LOW (ref 250–450)
UIBC: 121 ug/dL

## 2018-10-01 LAB — CBC
HCT: 23.1 % — ABNORMAL LOW (ref 39.0–52.0)
HCT: 24.6 % — ABNORMAL LOW (ref 39.0–52.0)
Hemoglobin: 7.2 g/dL — ABNORMAL LOW (ref 13.0–17.0)
Hemoglobin: 7.6 g/dL — ABNORMAL LOW (ref 13.0–17.0)
MCH: 31.2 pg (ref 26.0–34.0)
MCH: 31 pg (ref 26.0–34.0)
MCHC: 31.2 g/dL (ref 30.0–36.0)
MCHC: 30.9 g/dL (ref 30.0–36.0)
MCV: 100 fL (ref 80.0–100.0)
MCV: 100.4 fL — ABNORMAL HIGH (ref 80.0–100.0)
PLATELETS: 181 10*3/uL (ref 150–400)
Platelets: 200 10*3/uL (ref 150–400)
RBC: 2.31 MIL/uL — ABNORMAL LOW (ref 4.22–5.81)
RBC: 2.45 MIL/uL — AB (ref 4.22–5.81)
RDW: 16.9 % — ABNORMAL HIGH (ref 11.5–15.5)
RDW: 16.7 % — ABNORMAL HIGH (ref 11.5–15.5)
WBC: 4.8 10*3/uL (ref 4.0–10.5)
WBC: 5.9 10*3/uL (ref 4.0–10.5)
nRBC: 0 % (ref 0.0–0.2)
nRBC: 0 % (ref 0.0–0.2)

## 2018-10-01 LAB — VITAMIN B12: Vitamin B-12: 1137 pg/mL — ABNORMAL HIGH (ref 180–914)

## 2018-10-01 LAB — FOLATE: Folate: 16.4 ng/mL (ref >5.9)

## 2018-10-01 LAB — RETICULOCYTES
IMMATURE RETIC FRACT: 13.5 % (ref 2.3–15.9)
RBC.: 2.45 MIL/uL — ABNORMAL LOW (ref 4.22–5.81)
Retic Count, Absolute: 91.1 10*3/uL (ref 19.0–186.0)
Retic Ct Pct: 3.7 % — ABNORMAL HIGH (ref 0.4–3.1)

## 2018-10-01 LAB — MAGNESIUM: Magnesium: 1.7 mg/dL (ref 1.7–2.4)

## 2018-10-01 MED ORDER — TAMSULOSIN HCL 0.4 MG PO CAPS
0.4000 mg | ORAL_CAPSULE | Freq: Every day | ORAL | Status: DC
  Administered 2018-10-01 – 2018-10-04 (×3): 0.4 mg via ORAL
  Filled 2018-10-01 (×4): qty 1

## 2018-10-01 MED ORDER — IPRATROPIUM-ALBUTEROL 0.5-2.5 (3) MG/3ML IN SOLN: 3.0000 mL | Freq: Four times a day (QID) | RESPIRATORY_TRACT | Status: DC | PRN

## 2018-10-01 MED ORDER — SODIUM CHLORIDE 0.9 % IV SOLN
1.0000 g | INTRAVENOUS | Status: AC
  Administered 2018-10-01 – 2018-10-03 (×3): 1 g via INTRAVENOUS
  Filled 2018-10-01 (×3): qty 10

## 2018-10-01 MED ORDER — SODIUM CHLORIDE 0.9 % IV SOLN: INTRAVENOUS | Status: DC

## 2018-10-01 MED ORDER — LACTATED RINGERS IV SOLN
INTRAVENOUS | Status: AC
  Administered 2018-10-01 – 2018-10-02 (×3): via INTRAVENOUS

## 2018-10-01 NOTE — Care Management Note (Signed)
Case Management Note Manya Silvas, RN MSN CCM Transitions of Care [address] [place] [phone]  Patient Details  Name: [name] [name] MRN: [id number] Date of Birth: 08/13/1928  Subjective/Objective:          Acute renal failure        Action/Plan: PTA home with daughter. History of renal transplant. Spoke with PT about today's session who advised that pt may be a candidate for [place] program vs SNF. Referral made to [facility] at [place]. Will continue to follow fro transition of care needs.   Expected Discharge Date:                  Expected Discharge Plan:  [place]  In-House Referral:  Clinical Social Work  Discharge planning Services  CM Consult  Post Acute Care Choice:    Choice offered to:     DME Arranged:    DME Agency:     HH Arranged:    [place] Agency:     Status of Service:  In process, will continue to follow  If discussed at Long Length of Stay Meetings, dates discussed:    Additional Comments:  Bartholomew Crews, RN 10/01/2018, 12:25 PM

## 2018-10-01 NOTE — Progress Notes (Signed)
PROGRESS NOTE    [name] [name]  OIZ:[id number] DOB: Feb 02, 1928 DOA: 09/30/2018 PCP: Mauricia Area, MD   Brief Narrative: 85 year old male with history of kidney transplant several decades ago, CKD stage IV, PAF, hypertension, HLD, anemia of chronic disease, chronic weakness and deconditioning with moderate to severe protein calorie malnutrition who lives at home with daughter, is minimally active, using cane who has been feeling poorly with sore throat chills not eating and drinking well, was brought to the ER for evaluation point of fever of 102.  In the ER chest x-ray, UA and influenza screen were unremarkable noted to have AKI and was admitted for further management  Subjective: Patient resting.  Appears alert awake, oriented.  Ate 75% of his meal this morning.  No fever overnight.  Assessment & Plan:   ARF  On CKD stage IV, baseline creatinine close to 1.8. Creat  On admit 2.5->2.2, improving w ivf we will continue on IV RL. Suspect multifactorial from prerenal with decreased oral intake, possible some component of obstruction due to BPH, bladder scan showed 607 urine and needed in and out catheterization.  Order renal ultrasound.cont ivf. monior I/o, bladder scan.  Fever at home with SIRS, UA showed WBC more than 50 large leukocyte esterase, and also having retention suspecting UTI, given his  immunosuppressed status.  Cont on empiric ceftriaxone, follow-up on urine culture( RN informed to add on). Blood cx In process.  BP is stable. Chest x-ray showed poorly defined rounded nodular density in the left lung base nodule vs rounded focus of pneumonia or atelectasis- ct chest recommended, will order to further characterize.  Prior CT scan showed changes of asbestosis and calcification.  Status post renal transplant several decades ago, chronically immunosuppressed on prednisone, mycophenolate and cyclosporine.Known to Dr. Jimmy Footman from nephrology,  BPH, he has had history of cystoscopy.I  will start on Flomax.  Bladder scan if he has ongoing urine retention will need to consider urology consultation/Foley catheter.He has had Foley placed in the past. Obtaininig US renal.  If he has further retention may need to place catheter.  Stage II pressure ulcer sacrum, POA-cont off loading, wound care.  Protein-calorie malnutrition, severe: Cont to nutrition.  Consulted dietitian. Poor oral intake/deconditioning/failure to thrive.  Patient has become forgetful for family.  She was frustrated, not eating well.  Consider low dose Remeron bedtime  Anemia of chronic disease, with drop in hemoglobin from 9.7 g on admission (likely hemoconcentrated), this morning 7.2 to 7.6 g, ordered anemia panel.  Suspect from ivf and anemia of renal disease.  No obvious blood loss noted, check Hemoccult in he stool.  Of note he is on Eliquis for A. fib.  PAF:in NSR, continue home Cardizem and Eliquis-renal dose.  DVT prophylaxis: Eliquis  Code Status: full Code Family Communication: no family at bedside. Disposition Plan: Pending clinical improvement.  Admitted as observation. I feel patient will need at least 2 midnight stay to manage his ongoing renal failure, failure to thrive, UTI so we will change him to inpatient status.  Consultants: none  Procedures: none  Antimicrobials: Anti-infectives (From admission, onward)   Start     Dose/Rate Route Frequency Ordered Stop   10/01/18 1800  cefTRIAXone (ROCEPHIN) 1 g in sodium chloride 0.9 % 100 mL IVPB     1 g 200 mL/hr over 30 Minutes Intravenous Every 24 hours 10/01/18 1149 10/04/18 1759   09/30/18 1800  cefTRIAXone (ROCEPHIN) 1 g in sodium chloride 0.9 % 100 mL IVPB     1  g 200 mL/hr over 30 Minutes Intravenous  Once 09/30/18 1746 09/30/18 1847   09/30/18 1730  oseltamivir (TAMIFLU) capsule 75 mg     75 mg Oral  Once 09/30/18 1727 09/30/18 1819       Objective: Vitals:   10/01/18 0404 10/01/18 0531 10/01/18 0845 10/01/18 0900  BP: (!) 132/103  (!) 154/97  (!) 141/101  Pulse: 66 80 67 82  Resp: 18  18 16   Temp: 97.8 F (36.6 C)   97.8 F (36.6 C)  TempSrc: Oral   Oral  SpO2: 95%  97% 96%  Weight: 56.6 kg     Height: 6\' 3"  (1.905 m)       Intake/Output Summary (Last 24 hours) at 10/01/2018 1203 Last data filed at 10/01/2018 0835 Gross per 24 hour  Intake 2123.15 ml  Output 350 ml  Net 1773.15 ml   Filed Weights   09/30/18 1448 10/01/18 0404  Weight: 60.8 kg 56.6 kg   Weight change:   Body mass index is 15.6 kg/m.  Intake/Output from previous day: 01/20 0701 - 01/21 0700 In: 1683.2 [P.O.:240; I.V.:843.2; IV Piggyback:600] Out: 350 [Urine:350] Intake/Output this shift: Total I/O In: 440 [P.O.:440] Out: 0   Examination:  General exam:Appears calm and comfortable,frail, elderly.  HEENT:PERRL,Oral mucosa moist, Ear/Nose normal on gross exam. Respiratory system: Bilateral equal air entry, normal vesicular breath sounds, no wheezes or crackles.  Cardiovascular system: S1 & S2 heard,No JVD, murmurs. Gastrointestinal system: Abdomen is  soft, non tender, non distended, BS +  Nervous System:Alert and oriented to self, place ( but not exact name of hospital) unable to tell who the current president is and current date.No focal neurological deficits/moving extremities, sensation intact. Extremities:No edema, no clubbing, distal peripheral pulses palpable. Skin:No rashes, lesions, no icterus. Skin is dray. TKZ:SWFUXN muscle bulk,tone ,power  Medications: Scheduled Meds: . apixaban  2.5 mg Oral BID  . aspirin  81 mg Oral Daily  . cycloSPORINE modified  125 mg Oral BID  . diltiazem  180 mg Oral Daily  . famotidine  20 mg Oral QHS  . feeding supplement (ENSURE ENLIVE)  237 mL Oral TID BM  . feeding supplement (PRO-STAT SUGAR FREE 64)  30 mL Oral TID WC  . ipratropium-albuterol  3 mL Nebulization Q6H  . mycophenolate  180 mg Oral BID  . predniSONE  5 mg Oral Q breakfast  . tamsulosin  0.4 mg Oral QHS   Continuous  Infusions: . cefTRIAXone (ROCEPHIN)  IV    . lactated ringers      Data Reviewed: I have personally reviewed following labs and imaging studies  CBC: Recent Labs  Lab 09/30/18 1500 10/01/18 0505 10/01/18 0950  WBC 5.7 4.8 5.9  NEUTROABS 4.2  --   --   HGB 9.7* 7.2* 7.6*  HCT 32.0* 23.1* 24.6*  MCV 102.2* 100.0 100.4*  PLT 219 181 235   Basic Metabolic Panel: Recent Labs  Lab 09/30/18 1500 09/30/18 1508 10/01/18 0505  NA 139  --  138  K 4.9  --  4.5  CL 103  --  106  CO2 25  --  23  GLUCOSE 97  --  101*  BUN 53*  --  51*  CREATININE 2.49*  --  2.21*  CALCIUM 9.1  --  8.6*  MG  --  1.9 1.7  PHOS  --  3.0  --    GFR: Estimated Creatinine Clearance: 18.5 mL/min (A) (by C-G formula based on SCr of 2.21 mg/dL (H)). Liver  Function Tests: Recent Labs  Lab 09/30/18 1500  AST 24  ALT 14  ALKPHOS 76  BILITOT 1.1  PROT 6.9  ALBUMIN 2.0*   No results for input(s): LIPASE, AMYLASE in the last 168 hours. No results for input(s): AMMONIA in the last 168 hours. Coagulation Profile: Recent Labs  Lab 09/30/18 1500  INR 1.22   Cardiac Enzymes: No results for input(s): CKTOTAL, CKMB, CKMBINDEX, TROPONINI in the last 168 hours. BNP (last 3 results) No results for input(s): PROBNP in the last 8760 hours. HbA1C: No results for input(s): HGBA1C in the last 72 hours. CBG: No results for input(s): GLUCAP in the last 168 hours. Lipid Profile: No results for input(s): CHOL, HDL, LDLCALC, TRIG, CHOLHDL, LDLDIRECT in the last 72 hours. Thyroid Function Tests: Recent Labs    09/30/18 1920  TSH 3.369   Anemia Panel: Recent Labs    10/01/18 0950  VITAMINB12 1,137*  FOLATE 16.4  FERRITIN 335  TIBC 136*  IRON 15*  RETICCTPCT 3.7*   Sepsis Labs: Recent Labs  Lab 09/30/18 1650  LATICACIDVEN 2.75*    No results found for this or any previous visit (from the past 240 hour(s)).    Radiology Studies: Dg Chest Port 1 View  Result Date: 09/30/2018 CLINICAL DATA:   Possible seizure today. Fever of 102 since this afternoon. EXAM: PORTABLE CHEST 1 VIEW COMPARISON:  03/26/2018 and chest CT dated 07/22/2018. FINDINGS: Normal sized heart. Tortuous and mildly calcified thoracic aorta. The lungs remain hyperexpanded with bullous changes and previously demonstrated bilateral calcified pleural plaques. Careful poorly defined rounded nodular density at the left lung base. The interstitial markings remain mildly prominent. Diffuse osteopenia. IMPRESSION: 1. Interval poorly defined rounded nodular density at the left lung base. This could represent a true nodule or rounded focus of pneumonia or atelectasis. This could be further evaluated with a chest CT with contrast. 2. Stable changes of COPD and bilateral calcified pleural plaques compatible with previous asbestos exposure. Electronically Signed   By: Claudie Revering M.D.   On: 09/30/2018 15:24      LOS: 0 days   Time spent: More than 50% of that time was spent in counseling and/or coordination of care.  Antonieta Pert, MD [facility]  10/01/2018, 12:03 PM

## 2018-10-01 NOTE — Consult Note (Signed)
   [facility] Inpatient Consult   10/01/2018  [name] [name] [birth year] [id number]    [facility] Care Management referral received. Spoke with inpatient RNCM who indicates that [facility] was recommended. [name] with [facility] is going to follow up with patient/family regarding [place] program services.  Therefore, [facility] Care Management would not be appropriate at the time. Writer went to bedside to discuss potential needs.  However, respiratory was at bedside..  Will continue to follow along for disposition plans and progression.   Marthenia Rolling, MSN-Ed, RN,BSN [facility] Liaison [phone]

## 2018-10-01 NOTE — Progress Notes (Signed)
Patient retaining 409 ml post void. Text paged NP Schorr. Awaiting response.

## 2018-10-01 NOTE — Evaluation (Signed)
Physical Therapy Evaluation Patient Details Name: [name] [name] MRN: [id number] DOB: 1928/05/16 Today's Date: 10/01/2018   History of Present Illness  Pt is an 85 y/o male with a PMH significant for kidney transplant (R in 1980, L in 1994 with R transplanted kidney removal), TIA, ESRD. Pt presents with a "few day" history of feeling poorly, sore throat, fever and chills.   Clinical Impression  Pt admitted with above diagnosis. Pt currently with functional limitations due to the deficits listed below (see PT Problem List). At the time of PT eval pt was able to perform transfers and minimal ambulation with gross min guard to min assist with RW for support. Tolerance for functional activity is decreased, however feel we could have ambulated to the hall if cognition allowed (pt confused and insisting he has already walked with PT x2 today - daughter present and confirming he has not been OOB this morning). Pt's daughter has been home with him since mid-December but states she will have to return to work soon. This patient would benefit from [facility] program if pt qualifies. If he does not qualify, feel pt will require SNF level rehab prior to return home with family. Acutely, pt will benefit from skilled PT to increase their independence and safety with mobility to allow discharge to the venue listed below.      Follow Up Recommendations SNF;Supervision/Assistance - 24 hour    Equipment Recommendations  Rolling walker with 5" wheels    Recommendations for Other Services       Precautions / Restrictions Precautions Precautions: Fall Precaution Comments: Pt technically a "mod fall risk", however feel he is at a high risk for falls Restrictions Weight Bearing Restrictions: No      Mobility  Bed Mobility Overal bed mobility: Needs Assistance Bed Mobility: Supine to Sit     Supine to sit: Min guard;HOB elevated     General bed mobility comments: Increased time and HOB elevated  for assist. Pt was able to transition to EOB without physical assistance, however close guard provided for safety.   Transfers Overall transfer level: Needs assistance Equipment used: Rolling walker (2 wheeled) Transfers: Sit to/from Stand Sit to Stand: Min assist         General transfer comment: Light min assist for pt to power up to full stand from low bed height. VC's for hand placement on seated surface for safety.   Ambulation/Gait Ambulation/Gait assistance: Min guard Gait Distance (Feet): 5 Feet Assistive device: Rolling walker (2 wheeled) Gait Pattern/deviations: Step-to pattern;Decreased stride length Gait velocity: Decreased Gait velocity interpretation: <1.8 ft/sec, indicate of risk for recurrent falls General Gait Details: Pt was able to take a few steps and pivot around to the recliner chair from the bed - ~5 feet. Pt overall steady with the RW.   Stairs            Wheelchair Mobility    Modified Rankin (Stroke Patients Only)       Balance Overall balance assessment: Needs assistance Sitting-balance support: Feet supported;No upper extremity supported Sitting balance-Leahy Scale: Fair     Standing balance support: Bilateral upper extremity supported;During functional activity Standing balance-Leahy Scale: Poor Standing balance comment: Reliant on UE support                             Pertinent Vitals/Pain Pain Assessment: No/denies pain    Home Living Family/patient expects to be discharged to:: Private residence Living Arrangements:  Children Available Help at Discharge: Family;Available PRN/intermittently Type of Home: House Home Access: Level entry     Home Layout: One level Home Equipment: Clinical cytogeneticist - 4 wheels      Prior Function Level of Independence: Needs assistance   Gait / Transfers Assistance Needed: Has a rollator but daughter has been providing hands on assist for short walking trips (bathroom, kitchen,  etc).   ADL's / Homemaking Assistance Needed: Assist from daughter  Comments: Mid december starting not eating well and function began to decline. Daughter has been home with him since then and has been helping with ADL's.      Hand Dominance   Dominant Hand: Right    Extremity/Trunk Assessment   Upper Extremity Assessment Upper Extremity Assessment: Generalized weakness    Lower Extremity Assessment Lower Extremity Assessment: Generalized weakness    Cervical / Trunk Assessment Cervical / Trunk Assessment: Normal  Communication   Communication: Expressive difficulties;HOH  Cognition Arousal/Alertness: Awake/alert Behavior During Therapy: WFL for tasks assessed/performed Overall Cognitive Status: Impaired/Different from baseline Area of Impairment: Orientation;Memory;Following commands;Safety/judgement;Awareness;Problem solving                 Orientation Level: Disoriented to;Person;Situation;Time   Memory: Decreased short-term memory Following Commands: Follows one step commands consistently;Follows multi-step commands inconsistently;Follows multi-step commands with increased time Safety/Judgement: Decreased awareness of safety;Decreased awareness of deficits Awareness: Intellectual Problem Solving: Slow processing;Decreased initiation;Difficulty sequencing;Requires verbal cues        General Comments      Exercises     Assessment/Plan    PT Assessment Patient needs continued PT services  PT Problem List Decreased strength;Decreased activity tolerance;Decreased balance;Decreased mobility;Decreased knowledge of use of DME;Decreased cognition;Decreased safety awareness;Decreased knowledge of precautions       PT Treatment Interventions DME instruction;Gait training;Stair training;Functional mobility training;Therapeutic activities;Therapeutic exercise;Neuromuscular re-education;Patient/family education    PT Goals (Current goals can be found in the Care  Plan section)  Acute Rehab PT Goals Patient Stated Goal: Pt prefers to return home at d/c vs going to SNF.  PT Goal Formulation: With patient/family Time For Goal Achievement: 10/08/18 Potential to Achieve Goals: Good    Frequency Min 3X/week   Barriers to discharge Decreased caregiver support Daughter has been home with him since mid December but has to go back to work soon.     Co-evaluation               AM-PAC PT "6 Clicks" Mobility  Outcome Measure Help needed turning from your back to your side while in a flat bed without using bedrails?: None Help needed moving from lying on your back to sitting on the side of a flat bed without using bedrails?: A Little Help needed moving to and from a bed to a chair (including a wheelchair)?: A Little Help needed standing up from a chair using your arms (e.g., wheelchair or bedside chair)?: A Little Help needed to walk in hospital room?: A Little Help needed climbing 3-5 steps with a railing? : A Lot 6 Click Score: 18    End of Session Equipment Utilized During Treatment: Gait belt Activity Tolerance: Patient tolerated treatment well Patient left: in chair;with call bell/phone within reach;with chair alarm set;with family/visitor present Nurse Communication: Mobility status PT Visit Diagnosis: Unsteadiness on feet (R26.81);Muscle weakness (generalized) (M62.81);Difficulty in walking, not elsewhere classified (R26.2)    Time: 3662-9476 PT Time Calculation (min) (ACUTE ONLY): 39 min   Charges:   PT Evaluation $PT Eval Moderate Complexity: 1 Mod PT Treatments $Gait  Training: 23-37 mins        Rolinda Roan, PT, DPT Acute Rehabilitation Services Pager: [phone] Office: [phone]   Thelma Comp 10/01/2018, 1:28 PM

## 2018-10-01 NOTE — Progress Notes (Signed)
Pt. bladder scan showed 607cc. Notified NP Schorr. Order given for In and Out Cath. Emptied 350 cc. Urine had some brownish sediments with clots.

## 2018-10-02 ENCOUNTER — Encounter (HOSPITAL_COMMUNITY): Payer: Self-pay | Admitting: Cardiology

## 2018-10-02 DIAGNOSIS — R6510 Systemic inflammatory response syndrome (SIRS) of non-infectious origin without acute organ dysfunction: Secondary | ICD-10-CM

## 2018-10-02 DIAGNOSIS — Z7189 Other specified counseling: Secondary | ICD-10-CM

## 2018-10-02 DIAGNOSIS — F03918 Unspecified dementia, unspecified severity, with other behavioral disturbance: Secondary | ICD-10-CM

## 2018-10-02 DIAGNOSIS — I4819 Other persistent atrial fibrillation: Secondary | ICD-10-CM

## 2018-10-02 DIAGNOSIS — Z515 Encounter for palliative care: Secondary | ICD-10-CM

## 2018-10-02 DIAGNOSIS — N179 Acute kidney failure, unspecified: Secondary | ICD-10-CM

## 2018-10-02 DIAGNOSIS — F0391 Unspecified dementia with behavioral disturbance: Secondary | ICD-10-CM

## 2018-10-02 LAB — BASIC METABOLIC PANEL
Anion gap: 10 (ref 5–15)
BUN: 57 mg/dL — ABNORMAL HIGH (ref 8–23)
CO2: 25 mmol/L (ref 22–32)
Calcium: 8.7 mg/dL — ABNORMAL LOW (ref 8.9–10.3)
Chloride: 104 mmol/L (ref 98–111)
Creatinine, Ser: 2.01 mg/dL — ABNORMAL HIGH (ref 0.61–1.24)
GFR calc Af Amer: 33 mL/min — ABNORMAL LOW (ref >60)
GFR, EST NON AFRICAN AMERICAN: 29 mL/min — AB (ref >60)
Glucose, Bld: 118 mg/dL — ABNORMAL HIGH (ref 70–99)
Potassium: 4.7 mmol/L (ref 3.5–5.1)
Sodium: 139 mmol/L (ref 135–145)

## 2018-10-02 LAB — PREPARE RBC (CROSSMATCH)

## 2018-10-02 LAB — CBC
HCT: 22.1 % — ABNORMAL LOW (ref 39.0–52.0)
Hemoglobin: 6.6 g/dL — CL (ref 13.0–17.0)
MCH: 29.9 pg (ref 26.0–34.0)
MCHC: 29.9 g/dL — ABNORMAL LOW (ref 30.0–36.0)
MCV: 100 fL (ref 80.0–100.0)
Platelets: 176 10*3/uL (ref 150–400)
RBC: 2.21 MIL/uL — ABNORMAL LOW (ref 4.22–5.81)
RDW: 16.5 % — ABNORMAL HIGH (ref 11.5–15.5)
WBC: 4.7 10*3/uL (ref 4.0–10.5)
nRBC: 0 % (ref 0.0–0.2)

## 2018-10-02 LAB — OCCULT BLOOD X 1 CARD TO LAB, STOOL: FECAL OCCULT BLD: POSITIVE — AB

## 2018-10-02 MED ORDER — PRO-STAT SUGAR FREE PO LIQD
30.0000 mL | Freq: Two times a day (BID) | ORAL | Status: DC
  Administered 2018-10-03 – 2018-10-05 (×3): 30 mL via ORAL
  Filled 2018-10-02 (×5): qty 30

## 2018-10-02 MED ORDER — FUROSEMIDE 10 MG/ML IJ SOLN
40.0000 mg | Freq: Once | INTRAMUSCULAR | Status: AC
  Administered 2018-10-02: 40 mg via INTRAVENOUS
  Filled 2018-10-02: qty 4

## 2018-10-02 MED ORDER — OLANZAPINE 5 MG PO TBDP
5.0000 mg | ORAL_TABLET | Freq: Every day | ORAL | Status: DC
  Administered 2018-10-02 – 2018-10-04 (×3): 5 mg via ORAL
  Filled 2018-10-02 (×3): qty 1

## 2018-10-02 MED ORDER — ONDANSETRON 4 MG PO TBDP
4.0000 mg | ORAL_TABLET | Freq: Once | ORAL | Status: AC
  Administered 2018-10-02: 4 mg via ORAL
  Filled 2018-10-02: qty 1

## 2018-10-02 MED ORDER — SODIUM CHLORIDE 0.9% IV SOLUTION: Freq: Once | INTRAVENOUS | Status: DC

## 2018-10-02 NOTE — Progress Notes (Signed)
Physical Therapy Treatment Patient Details Name: [name] [name] MRN: [id number] DOB: 06/11/1928 Today's Date: 10/02/2018    History of Present Illness Pt is an 85 y/o male with a PMH significant for kidney transplant (R in 1980, L in 1994 with R transplanted kidney removal), TIA, ESRD. Pt presents with a "few day" history of feeling poorly, sore throat, fever and chills.     PT Comments    Pt received in bed, in good spirits, willing to participate in therapy. Pt able to perform bed mobility mod(I), transfers and ambulation with min guard for safety. Pt able to ambulate approximately 100 ft today using RW, confusion appears to be decreased from eval. Pt returned to his bed and performed seated marches before reporting needing a rest and rolling back into bed. Pt required 3 min rest break before able to move to chair, where he was left with family in the room and call bell within reach. He will continue to benefit from skilled PT services to increase his limited functional mobility. Continue to recommend [facility] if pt qualifies, or SNF if pt does not qualify.   Follow Up Recommendations  SNF;Supervision/Assistance - 24 hour     Equipment Recommendations  Rolling walker with 5" wheels    Recommendations for Other Services       Precautions / Restrictions Precautions Precautions: Fall Restrictions Weight Bearing Restrictions: No    Mobility  Bed Mobility Overal bed mobility: Needs Assistance       Supine to sit: Modified independent (Device/Increase time)     General bed mobility comments: HOB elevated; pt able to perform bed mobility with increased time and no physical assistance  Transfers Overall transfer level: Needs assistance Equipment used: Rolling walker (2 wheeled) Transfers: Sit to/from Stand Sit to Stand: Min guard         General transfer comment: min guard provided for pt safety and balance. verbal cues for hand placement on bed to push off for  safety  Ambulation/Gait Ambulation/Gait assistance: Min guard Gait Distance (Feet): 100 Feet Assistive device: Rolling walker (2 wheeled) Gait Pattern/deviations: Step-through pattern;Decreased stride length;Drifts right/left Gait velocity: Decreased Gait velocity interpretation: <1.8 ft/sec, indicate of risk for recurrent falls General Gait Details: Pt able to ambulate in hallway using RW and min guard for safety and verbal cues for forward gaze and obstacle avoidance. Observe heel-toe, step through pattern.   Stairs             Wheelchair Mobility    Modified Rankin (Stroke Patients Only)       Balance Overall balance assessment: Needs assistance Sitting-balance support: Feet supported;No upper extremity supported Sitting balance-Leahy Scale: Fair     Standing balance support: Bilateral upper extremity supported;During functional activity Standing balance-Leahy Scale: Fair                              Cognition Arousal/Alertness: Awake/alert Behavior During Therapy: WFL for tasks assessed/performed Overall Cognitive Status: Impaired/Different from baseline Area of Impairment: Problem solving;Safety/judgement                       Following Commands: Follows one step commands consistently;Follows multi-step commands inconsistently;Follows multi-step commands with increased time   Awareness: Intellectual Problem Solving: Slow processing;Requires verbal cues        Exercises General Exercises - Lower Extremity Hip Flexion/Marching: AROM;Both;5 reps    General Comments        Pertinent  Vitals/Pain Pain Assessment: No/denies pain    Home Living                      Prior Function            PT Goals (current goals can now be found in the care plan section) Acute Rehab PT Goals Patient Stated Goal: Pt prefers to return home at d/c vs going to SNF.  PT Goal Formulation: With patient/family Time For Goal Achievement:  10/08/18 Potential to Achieve Goals: Good Progress towards PT goals: Progressing toward goals    Frequency    Min 3X/week      PT Plan Current plan remains appropriate    Co-evaluation              AM-PAC PT "6 Clicks" Mobility   Outcome Measure  Help needed turning from your back to your side while in a flat bed without using bedrails?: None Help needed moving from lying on your back to sitting on the side of a flat bed without using bedrails?: None Help needed moving to and from a bed to a chair (including a wheelchair)?: A Little Help needed standing up from a chair using your arms (e.g., wheelchair or bedside chair)?: A Little Help needed to walk in hospital room?: A Little Help needed climbing 3-5 steps with a railing? : A Lot 6 Click Score: 19    End of Session Equipment Utilized During Treatment: Gait belt Activity Tolerance: Patient tolerated treatment well Patient left: in chair;with call bell/phone within reach;with family/visitor present   PT Visit Diagnosis: Unsteadiness on feet (R26.81);Muscle weakness (generalized) (M62.81);Difficulty in walking, not elsewhere classified (R26.2)     Time: 8675-4492 PT Time Calculation (min) (ACUTE ONLY): 19 min  Charges:  $Gait Training: 8-22 mins                      Ronnell Guadalajara, SPT

## 2018-10-02 NOTE — Progress Notes (Signed)
PROGRESS NOTE    [name] [name]  WNU:[id number] DOB: 03-15-1928 DOA: 09/30/2018 PCP: Mauricia Area, MD   Brief Narrative: 85 year old male with history of kidney transplant several decades ago, CKD stage III, PAF, hypertension, HLD, anemia of chronic disease, chronic weakness and deconditioning with moderate to severe protein calorie malnutrition who lives at home with daughter, is minimally active, using cane who has been feeling poorly with sore throat chills not eating and drinking well, was brought to the ER for evaluation point of fever of 102.  In the ER chest x-ray, UA and influenza screen were unremarkable noted to have AKI and was admitted for further management  Subjective: Patient was seen alongside patient's son.  No new complaints.  No significant history from the patient.    Assessment & Plan:   AKI on CKD III:  Baseline creatinine close to 1.8. Creat  On admit 2.5->2.2, improving w ivf we will continue on IV RL. Suspect multifactorial from prerenal with decreased oral intake, possible some component of obstruction due to BPH, bladder scan showed 607 urine and needed in and out catheterization.  Order renal ultrasound.cont ivf. monior I/o, bladder scan. 10/02/2018: Serum creatinine is down to 2.01 today.  Continue to monitor.  Continue treatment for possible UTI.  Fever at home with SIRS, UA showed WBC more than 50 large leukocyte esterase, and also having retention suspecting UTI, given his  immunosuppressed status.  Cont on empiric ceftriaxone, follow-up on urine culture( RN informed to add on). Blood cx In process.  BP is stable. Chest x-ray showed poorly defined rounded nodular density in the left lung base nodule vs rounded focus of pneumonia or atelectasis- ct chest recommended, will order to further characterize.  Prior CT scan showed changes of asbestosis and calcification. 10/02/2018: Urinalysis is suggestive of likely UTI.  Continue antibiotics.    Status post renal  transplant several decades ago, chronically immunosuppressed on prednisone, mycophenolate and cyclosporine.Known to Dr. Jimmy Footman from nephrology, 10/02/2018 AKI of transplanted kidney is improving.  Continue to monitor.  BPH, he has had history of cystoscopy.I will start on Flomax.  Bladder scan if he has ongoing urine retention will need to consider urology consultation/Foley catheter.He has had Foley placed in the past. Obtaininig US renal.  If he has further retention may need to place catheter.  Stage II pressure ulcer sacrum, POA-cont off loading, wound care.  Protein-calorie malnutrition, severe: Cont to nutrition.  Consulted dietitian. Poor oral intake/deconditioning/failure to thrive.  Patient has become forgetful for family.  She was frustrated, not eating well.  Consider low dose Remeron bedtime  Anemia of chronic disease/Acute blood loss anemia: with drop in hemoglobin from 9.7 g on admission (likely hemoconcentrated), this morning 7.2 to 7.6 g, ordered anemia panel.  Suspect from ivf and anemia of renal disease.  No obvious blood loss noted, check Hemoccult in he stool.  Of note he is on Eliquis for A. Fib. 10/02/2018: Hemoglobin is 6.6 g/dL today.  Stool occult blood is positive.  GI and cardiology team consulted.  As per cardiology note, Eliquis will be discontinued.  PAF:in NSR, continue home Cardizem.  DVT prophylaxis:  Code Status: full Code Family Communication: Son Disposition Plan: This will depend on hospital course  consultants: Cardiology and GI.  Procedures: none  Antimicrobials: Anti-infectives (From admission, onward)   Start     Dose/Rate Route Frequency Ordered Stop   10/01/18 1800  cefTRIAXone (ROCEPHIN) 1 g in sodium chloride 0.9 % 100 mL IVPB     1  g 200 mL/hr over 30 Minutes Intravenous Every 24 hours 10/01/18 1149 10/04/18 1759   Oct 11, 2018 1800  cefTRIAXone (ROCEPHIN) 1 g in sodium chloride 0.9 % 100 mL IVPB     1 g 200 mL/hr over 30 Minutes Intravenous   Once Oct 11, 2018 1746 October 11, 2018 1847   Oct 11, 2018 1730  oseltamivir (TAMIFLU) capsule 75 mg     75 mg Oral  Once 10/11/2018 1727 2018/10/11 1819       Objective: Vitals:   10/02/18 0734 10/02/18 1238 10/02/18 1240 10/02/18 1303  BP: 117/82 95/76  93/78  Pulse: 62 70  72  Resp: 18 18  16   Temp: (!) 97.3 F (36.3 C)  (!) 97.4 F (36.3 C) (!) 97.4 F (36.3 C)  TempSrc: Oral  Oral Oral  SpO2: 100% 100%  100%  Weight:      Height:        Intake/Output Summary (Last 24 hours) at 10/02/2018 1535 Last data filed at 10/02/2018 1300 Gross per 24 hour  Intake 830 ml  Output 1000 ml  Net -170 ml   Filed Weights   2018-10-11 1448 10/01/18 0404 10/01/18 2101  Weight: 60.8 kg 56.6 kg 56.5 kg   Weight change: -4.282 kg  Body mass index is 15.57 kg/m.  Intake/Output from previous day: 01/21 0701 - 01/22 0700 In: 1410 [P.O.:1410] Out: 700 [Urine:700] Intake/Output this shift: Total I/O In: 340 [P.O.:340] Out: 300 [Urine:300]  Examination:  General exam:Appears calm and comfortable.  HEENT: Pallor.  No jaundice Respiratory system: Clear to auscultation.   Cardiovascular system: S1 & S2 heard. Gastrointestinal system: Abdomen is  soft, non tender, non distended, BS +  Nervous System: Awake and alert.  Patient moves all limbs.   Extremities:No edema,   Medications: Scheduled Meds: . sodium chloride   Intravenous Once  . apixaban  2.5 mg Oral BID  . aspirin  81 mg Oral Daily  . cycloSPORINE modified  125 mg Oral BID  . diltiazem  180 mg Oral Daily  . famotidine  20 mg Oral QHS  . feeding supplement (ENSURE ENLIVE)  237 mL Oral TID BM  . feeding supplement (PRO-STAT SUGAR FREE 64)  30 mL Oral BID BM  . mycophenolate  180 mg Oral BID  . predniSONE  5 mg Oral Q breakfast  . tamsulosin  0.4 mg Oral QPC supper   Continuous Infusions: . cefTRIAXone (ROCEPHIN)  IV 1 g (10/01/18 1711)    Data Reviewed: I have personally reviewed following labs and imaging studies  CBC: Recent Labs    Lab 2018-10-11 1500 10/01/18 0505 10/01/18 0950 10/02/18 0412  WBC 5.7 4.8 5.9 4.7  NEUTROABS 4.2  --   --   --   HGB 9.7* 7.2* 7.6* 6.6*  HCT 32.0* 23.1* 24.6* 22.1*  MCV 102.2* 100.0 100.4* 100.0  PLT 219 181 200 161   Basic Metabolic Panel: Recent Labs  Lab 11-Oct-2018 1500 10-11-18 1508 10/01/18 0505 10/02/18 0412  NA 139  --  138 139  K 4.9  --  4.5 4.7  CL 103  --  106 104  CO2 25  --  23 25  GLUCOSE 97  --  101* 118*  BUN 53*  --  51* 57*  CREATININE 2.49*  --  2.21* 2.01*  CALCIUM 9.1  --  8.6* 8.7*  MG  --  1.9 1.7  --   PHOS  --  3.0  --   --    GFR: Estimated Creatinine Clearance: 20.3 mL/min (A) (  by C-G formula based on SCr of 2.01 mg/dL (H)). Liver Function Tests: Recent Labs  Lab 09/30/18 1500  AST 24  ALT 14  ALKPHOS 76  BILITOT 1.1  PROT 6.9  ALBUMIN 2.0*   No results for input(s): LIPASE, AMYLASE in the last 168 hours. No results for input(s): AMMONIA in the last 168 hours. Coagulation Profile: Recent Labs  Lab 09/30/18 1500  INR 1.22   Cardiac Enzymes: No results for input(s): CKTOTAL, CKMB, CKMBINDEX, TROPONINI in the last 168 hours. BNP (last 3 results) No results for input(s): PROBNP in the last 8760 hours. HbA1C: No results for input(s): HGBA1C in the last 72 hours. CBG: No results for input(s): GLUCAP in the last 168 hours. Lipid Profile: No results for input(s): CHOL, HDL, LDLCALC, TRIG, CHOLHDL, LDLDIRECT in the last 72 hours. Thyroid Function Tests: Recent Labs    09/30/18 1920  TSH 3.369   Anemia Panel: Recent Labs    10/01/18 0950  VITAMINB12 1,137*  FOLATE 16.4  FERRITIN 335  TIBC 136*  IRON 15*  RETICCTPCT 3.7*   Sepsis Labs: Recent Labs  Lab 09/30/18 1650  LATICACIDVEN 2.75*    Recent Results (from the past 240 hour(s))  Culture, blood (routine x 2)     Status: None (Preliminary result)   Collection Time: 09/30/18  2:58 PM  Result Value Ref Range Status   Specimen Description BLOOD RIGHT ANTECUBITAL   Final   Special Requests   Final    BOTTLES DRAWN AEROBIC AND ANAEROBIC Blood Culture adequate volume   Culture   Final    NO GROWTH 2 DAYS Performed at [facility], [place] [address], [place], [place] [address]    Report Status PENDING  Incomplete  Culture, blood (routine x 2)     Status: None (Preliminary result)   Collection Time: 09/30/18  3:02 PM  Result Value Ref Range Status   Specimen Description BLOOD LEFT FOREARM  Final   Special Requests   Final    BOTTLES DRAWN AEROBIC ONLY Blood Culture results may not be optimal due to an inadequate volume of blood received in culture bottles   Culture   Final    NO GROWTH 2 DAYS Performed at [facility], [place] [address], [place], [place] [address]    Report Status PENDING  Incomplete      Radiology Studies: Ct Chest Wo Contrast  Result Date: 10/01/2018 CLINICAL DATA:  Evaluate for lung nodule. EXAM: CT CHEST WITHOUT CONTRAST TECHNIQUE: Multidetector CT imaging of the chest was performed following the standard protocol without IV contrast. COMPARISON:  CT chest 07/22/2018 FINDINGS: Cardiovascular: Normal heart size. No pericardial effusion. Aortic atherosclerosis. Aneurysmal dilatation of the ascending thoracic aorta measures up to 4.3 cm. Multi vessel coronary artery atherosclerotic calcifications including left main disease noted. Mediastinum/Nodes: Normal appearance of the thyroid gland. The trachea appears patent and is midline. Small hiatal hernia suspected. No mediastinal adenopathy. No enlarged axillary lymph nodes. Lungs/Pleura: There are small bilateral pleural effusions which appear new from previous exam. Bilateral pleural calcifications are identified compatible with prior asbestos exposure. Advanced changes of emphysema noted. Patchy peribronchovascular predominant areas of mild ground-glass attenuation, most evident throughout the mid to upper lungs bilaterally, similar compared to the prior study. Scattered calcified  granulomas are noted in the lungs bilaterally. Emphysematous bulla within the posterior left lower lobe has a new nodular filling defect measuring 1.4 cm, image 77/8 and corresponds to the nodular density identified on recent chest radiograph. Upper Abdomen:  No acute abnormality. Musculoskeletal: No chest wall mass or suspicious bone lesions identified. The patient appears diffusely cachectic. IMPRESSION: 1. Asbestos related pleural disease redemonstrated. New small bilateral pleural effusions are identified. 2. Diffuse bronchial wall thickening with emphysema, as above; imaging findings suggestive of underlying COPD. Nonspecific mild peribronchovascular predominant ground-glass attenuation within the mid and upper lung zones bilaterally, stable compared to previous exam. 3. New nodular density filling a posterior left lower lobe emphysematous bulla is likely postinflammatory or infectious in etiology. Recommend followup imaging in 3 months with repeat CT of the chest to ensure resolution. 4. Aortic atherosclerosis as well as left main and 3 vessel coronary artery disease. 5. Aortic Atherosclerosis (ICD10-I70.0) and Emphysema (ICD10-J43.9). Electronically Signed   By: Kerby Moors M.D.   On: 10/01/2018 19:37   US Renal  Result Date: 10/01/2018 CLINICAL DATA:  Urinary retention EXAM: ULTRASOUND OF RENAL TRANSPLANT TECHNIQUE: Ultrasound examination of the renal transplant was performed with gray-scale and color Doppler evaluation. COMPARISON:  None. FINDINGS: Transplant kidney location:  LLQ Transplant kidney description: Renal measurements: 9.3 x 5.9 x 4.2 cm = volume: 124.73mL. Normal cortical echogenicity. No evidence of mass. Mild hydronephrosis is present. Color flow in the main renal artery at the hilum:  Yes Color flow in the main renal vein at the hilum:  Yes Bladder: There is focal wall thickening along the posterior wall of the bladder measuring up to 1.5 cm in thickness. There is a moderate amount of  fluid in the bladder. Other findings:  No perinephric fluid collections. IMPRESSION: There is a moderate amount of fluid in the bladder and mild hydronephrosis of the left lower quadrant transplant kidney. Focal wall thickening along the posterior wall of the bladder is noted. Transitional cell carcinoma is not excluded. Consider cystoscopy. Electronically Signed   By: Marybelle Killings M.D.   On: 10/01/2018 18:28      LOS: 1 day   Time spent: 35 minutes  Bonnell Public, MD [facility]  10/02/2018, 3:35 PM

## 2018-10-02 NOTE — Progress Notes (Signed)
Orders received to insert a foley catheter

## 2018-10-02 NOTE — Consult Note (Addendum)
UNASSIGNED PATIENT Reason for Consult: Blood in stool. Referring Physician: THP.  [name] [name] is an 85 y.o. male.  HPI: [name] is a 85 year old black male with multiple medical problems listed below. GI consultation was requested because the patient had guaiac positive stools. Patient was not able to give much history on his medical details but his daughter at the bedside did not want to answer any questions that she was not interested in a GI evaluation this time. She was not interested in having any invasive procedures done as palliative services have been requested. Patient had recently developed rapid A. Fib and was anticoagulated. He been weak and deconditioned with severe protein calorie malnutrition and had been feeling poorly. He was brought to the ER for evaluation of fever up to 102F. Influenza screen urinalysis 10 checks status x-ray were negative in the emergency room and is found to have acute kidney injury was admitted for evaluation. Patient was found to have a drop in his hemoglobin and guaiac positive stools and therefore a GI consult was requested..  Past Medical History:  Diagnosis Date  . Anemia in CKD (chronic kidney disease)   . Arthritis   . Bilateral hydrocele   . CKD (chronic kidney disease), stage II    nephrologist-  deterding  . Diverticulosis   . Dysuria   . H/O kidney transplant    right 1980/  left 1994 with right transplanted kidney removal  . Hematuria   . History of adenomatous polyp of colon    VILLOUS ADENOMA POLYP  . History of asbestos exposure   . History of condyloma acuminatum    genital  . 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 History of renal cell carcinoma    S/P  BILATERAL NEPHRECTOMY AND RENAL TRANSPLANTS  . History of small bowel obstruction    12/ 2006  due to adhesions -- resolved without surgerical intervention  . History of TIA (transient ischemic attack)   . Hyperlipidemia   . Hypertension   .  Proteinuria   . Secondary hyperparathyroidism, renal ([place])   . Wears glasses    Past Surgical History:  Procedure Laterality Date  . COLONOSCOPY  10/17/2012   Procedure: COLONOSCOPY;  Surgeon: Beryle Beams, MD;  Location: [facility];  Service: Endoscopy;  Laterality: N/A;  . CYSTOSCOPY N/A 07/13/2015   Procedure: CYSTOSCOPY;  Surgeon: Irine Seal, MD;  [facility];  Service: Urology;  Laterality: N/A;  . HEMICOLECTOMY Right 1995   adenoma polyp  . HYDROCELE EXCISION Bilateral 07/13/2015   Procedure: RIGHT HYDROCELECTOMY, ADULT DRAINAGE OF LEFT HYDROCELE;  Surgeon: Irine Seal, MD;  Location: [facility];  Service: Urology;  Laterality: Bilateral;  . KIDNEY TRANSPLANT  right 1980; left 1994---  [facility]   right kidney removed 1989/  left kidney removed 1994   Family History  Problem Relation Age of Onset  . Hypertension Mother    Social History:  reports that he has never smoked. He has never used smokeless tobacco. He reports that he does not drink alcohol or use drugs.  Allergies:  Allergies  Allergen Reactions  . Ciprofloxacin Rash  . Clindamycin/Lincomycin Rash  . Levofloxacin Rash  . Septra [Sulfamethoxazole-Trimethoprim] Rash   Medications: I have reviewed the patient's current medications.  Results for orders placed or performed during the hospital encounter of 09/30/18 (from the past 48 hour(s))  Basic metabolic panel     Status: Abnormal  Collection Time: 10/01/18  5:05 AM  Result Value Ref Range   Sodium 138 135 - 145 mmol/L   Potassium 4.5 3.5 - 5.1 mmol/L   Chloride 106 98 - 111 mmol/L   CO2 23 22 - 32 mmol/L   Glucose, Bld 101 (H) 70 - 99 mg/dL   BUN 51 (H) 8 - 23 mg/dL   Creatinine, Ser 2.21 (H) 0.61 - 1.24 mg/dL   Calcium 8.6 (L) 8.9 - 10.3 mg/dL   GFR calc non Af Amer 26 (L) >60 mL/min   GFR calc Af Amer 30 (L) >60 mL/min   Anion gap 9 5 - 15    Comment: Performed at [facility] [address],  [place], [place] [address]  CBC     Status: Abnormal   Collection Time: 10/01/18  5:05 AM  Result Value Ref Range   WBC 4.8 4.0 - 10.5 K/uL   RBC 2.31 (L) 4.22 - 5.81 MIL/uL   Hemoglobin 7.2 (L) 13.0 - 17.0 g/dL    Comment: REPEATED TO VERIFY DELTA CHECK NOTED    HCT 23.1 (L) 39.0 - 52.0 %   MCV 100.0 80.0 - 100.0 fL   MCH 31.2 26.0 - 34.0 pg   MCHC 31.2 30.0 - 36.0 g/dL   RDW 16.9 (H) 11.5 - 15.5 %   Platelets 181 150 - 400 K/uL   nRBC 0.0 0.0 - 0.2 %    Comment: Performed at [facility], [place] [address] [place], [place] [address]  Magnesium     Status: None   Collection Time: 10/01/18  5:05 AM  Result Value Ref Range   Magnesium 1.7 1.7 - 2.4 mg/dL    Comment: Performed at [facility] [address] [place], [place] [address]  CBC     Status: Abnormal   Collection Time: 10/01/18  9:50 AM  Result Value Ref Range   WBC 5.9 4.0 - 10.5 K/uL   RBC 2.45 (L) 4.22 - 5.81 MIL/uL   Hemoglobin 7.6 (L) 13.0 - 17.0 g/dL   HCT 24.6 (L) 39.0 - 52.0 %   MCV 100.4 (H) 80.0 - 100.0 fL   MCH 31.0 26.0 - 34.0 pg   MCHC 30.9 30.0 - 36.0 g/dL   RDW 16.7 (H) 11.5 - 15.5 %   Platelets 200 150 - 400 K/uL   nRBC 0.0 0.0 - 0.2 %    Comment: Performed at [facility], [place] [address] [place], [place] [address]  Vitamin B12     Status: Abnormal   Collection Time: 10/01/18  9:50 AM  Result Value Ref Range   Vitamin B-12 1,137 (H) 180 - 914 pg/mL    Comment: (NOTE) This assay is not validated for testing neonatal or myeloproliferative syndrome specimens for Vitamin B12 levels. Performed at [facility], [place] [address], [place], [place] [address]   Folate     Status: None   Collection Time: 10/01/18  9:50 AM  Result Value Ref Range   Folate 16.4 >5.9 ng/mL    Comment: Performed at [facility] [address], [place], [place] [address]  Iron and TIBC     Status: Abnormal   Collection Time: 10/01/18  9:50 AM  Result Value Ref Range   Iron 15 (L) 45 - 182 ug/dL    TIBC 136 (L) 250 - 450 ug/dL   Saturation Ratios 11 (L) 17.9 - 39.5 %   UIBC 121 ug/dL  Comment: Performed at [facility], [place] [address] [place], [place] [address]  Ferritin     Status: None   Collection Time: 10/01/18  9:50 AM  Result Value Ref Range   Ferritin 335 24 - 336 ng/mL    Comment: Performed at [facility] [address], [place], [place] [address]  Reticulocytes     Status: Abnormal   Collection Time: 10/01/18  9:50 AM  Result Value Ref Range   Retic Ct Pct 3.7 (H) 0.4 - 3.1 %   RBC. 2.45 (L) 4.22 - 5.81 MIL/uL   Retic Count, Absolute 91.1 19.0 - 186.0 K/uL   Immature Retic Fract 13.5 2.3 - 15.9 %    Comment: Performed at [facility] [address] [place], [place] [address]  Occult blood card to lab, stool     Status: Abnormal   Collection Time: 10/02/18  3:05 AM  Result Value Ref Range   Fecal Occult Bld POSITIVE (A) NEGATIVE    Comment: Performed at [facility] [address] [place], [place] [address]  Basic metabolic panel     Status: Abnormal   Collection Time: 10/02/18  4:12 AM  Result Value Ref Range   Sodium 139 135 - 145 mmol/L   Potassium 4.7 3.5 - 5.1 mmol/L   Chloride 104 98 - 111 mmol/L   CO2 25 22 - 32 mmol/L   Glucose, Bld 118 (H) 70 - 99 mg/dL   BUN 57 (H) 8 - 23 mg/dL   Creatinine, Ser 2.01 (H) 0.61 - 1.24 mg/dL   Calcium 8.7 (L) 8.9 - 10.3 mg/dL   GFR calc non Af Amer 29 (L) >60 mL/min   GFR calc Af Amer 33 (L) >60 mL/min   Anion gap 10 5 - 15    Comment: Performed at [facility] [address], [place] [address]  CBC     Status: Abnormal   Collection Time: 10/02/18  4:12 AM  Result Value Ref Range   WBC 4.7 4.0 - 10.5 K/uL   RBC 2.21 (L) 4.22 - 5.81 MIL/uL   Hemoglobin 6.6 (LL) 13.0 - 17.0 g/dL    Comment: REPEATED TO VERIFY THIS CRITICAL RESULT HAS VERIFIED AND BEEN CALLED TO V. GYEKYE,RN BY TERRAN TYSOR ON 01 22 2020 AT 0547, AND HAS BEEN READ BACK.     HCT 22.1 (L) 39.0 - 52.0 %    MCV 100.0 80.0 - 100.0 fL   MCH 29.9 26.0 - 34.0 pg   MCHC 29.9 (L) 30.0 - 36.0 g/dL   RDW 16.5 (H) 11.5 - 15.5 %   Platelets 176 150 - 400 K/uL   nRBC 0.0 0.0 - 0.2 %    Comment: Performed at [facility] [address] [place], [place] [address]  Prepare RBC     Status: None   Collection Time: 10/02/18  6:39 AM  Result Value Ref Range   Order Confirmation      ORDER PROCESSED BY BLOOD BANK Performed at [facility], [place] [address] [place],  [address]     Ct Chest Wo Contrast  Result Date: 10/01/2018 CLINICAL DATA:  Evaluate for lung nodule. EXAM: CT CHEST WITHOUT CONTRAST TECHNIQUE: Multidetector CT imaging of the chest was performed following the standard protocol without IV contrast. COMPARISON:  CT chest 07/22/2018 FINDINGS: Cardiovascular: Normal heart size. No pericardial effusion. Aortic atherosclerosis. Aneurysmal dilatation of the ascending thoracic aorta measures up to  4.3 cm. Multi vessel coronary artery atherosclerotic calcifications including left main disease noted. Mediastinum/Nodes: Normal appearance of the thyroid gland. The trachea appears patent and is midline. Small hiatal hernia suspected. No mediastinal adenopathy. No enlarged axillary lymph nodes. Lungs/Pleura: There are small bilateral pleural effusions which appear new from previous exam. Bilateral pleural calcifications are identified compatible with prior asbestos exposure. Advanced changes of emphysema noted. Patchy peribronchovascular predominant areas of mild ground-glass attenuation, most evident throughout the mid to upper lungs bilaterally, similar compared to the prior study. Scattered calcified granulomas are noted in the lungs bilaterally. Emphysematous bulla within the posterior left lower lobe has a new nodular filling defect measuring 1.4 cm, image 77/8 and corresponds to the nodular density identified on recent chest radiograph. Upper Abdomen: No acute abnormality. Musculoskeletal: No  chest wall mass or suspicious bone lesions identified. The patient appears diffusely cachectic. IMPRESSION: 1. Asbestos related pleural disease redemonstrated. New small bilateral pleural effusions are identified. 2. Diffuse bronchial wall thickening with emphysema, as above; imaging findings suggestive of underlying COPD. Nonspecific mild peribronchovascular predominant ground-glass attenuation within the mid and upper lung zones bilaterally, stable compared to previous exam. 3. New nodular density filling a posterior left lower lobe emphysematous bulla is likely postinflammatory or infectious in etiology. Recommend followup imaging in 3 months with repeat CT of the chest to ensure resolution. 4. Aortic atherosclerosis as well as left main and 3 vessel coronary artery disease. 5. Aortic Atherosclerosis (ICD10-I70.0) and Emphysema (ICD10-J43.9). Electronically Signed   By: Kerby Moors M.D.   On: 10/01/2018 19:37   US Renal  Result Date: 10/01/2018 CLINICAL DATA:  Urinary retention EXAM: ULTRASOUND OF RENAL TRANSPLANT TECHNIQUE: Ultrasound examination of the renal transplant was performed with gray-scale and color Doppler evaluation. COMPARISON:  None. FINDINGS: Transplant kidney location:  LLQ Transplant kidney description: Renal measurements: 9.3 x 5.9 x 4.2 cm = volume: 124.55mL. Normal cortical echogenicity. No evidence of mass. Mild hydronephrosis is present. Color flow in the main renal artery at the hilum:  Yes Color flow in the main renal vein at the hilum:  Yes Bladder: There is focal wall thickening along the posterior wall of the bladder measuring up to 1.5 cm in thickness. There is a moderate amount of fluid in the bladder. Other findings:  No perinephric fluid collections. IMPRESSION: There is a moderate amount of fluid in the bladder and mild hydronephrosis of the left lower quadrant transplant kidney. Focal wall thickening along the posterior wall of the bladder is noted. Transitional cell  carcinoma is not excluded. Consider cystoscopy. Electronically Signed   By: Marybelle Killings M.D.   On: 10/01/2018 18:28   Review of Systems  Unable to perform ROS: Age   Blood pressure 133/90, pulse 63, temperature (!) 97.5 F (36.4 C), temperature source Oral, resp. rate 18, height 6\' 3"  (1.905 m), weight 56.5 kg, SpO2 100 %. Physical Exam  Constitutional: He appears well-developed and well-nourished.  HENT:  Head: Normocephalic and atraumatic.  Eyes: EOM are normal.  Neck: Normal range of motion. Neck supple.  Cardiovascular: Normal rate and regular rhythm.  GI: Normal appearance.   Assessment/Plan: 1) Anemia of chronic disease with drop in hemoglobin and guaiac-positive stools-and is unable to give details on his history and daughter is not interested in having any invasive procedures at this time therefore all GI workup will be held. Palliative care services have been requested a meeting his entire with the family tomorrow therefore further recommendation made if aggressive treatment as requested by the  family. 2) Acute on chronic kidney disease-Stage IV; patient is status post renal renal ultrasound several decades ago.  3) Severe protein calorie malnutrition. 4) PAF-on Cardizem and Eliquis. 5) Fever at home with SIRS-on empiric ceftriaxone. 6) BPH. 7) Stage II pressure ulcer and his sacrum. Marland KitchenMANN,Kache Mcclurg 10/02/2018, 8:08 PM

## 2018-10-02 NOTE — Progress Notes (Signed)
Initial Nutrition Assessment  DOCUMENTATION CODES:   Severe malnutrition in context of chronic illness, Underweight  INTERVENTION:   - Continue Ensure Enlive po BID, each supplement provides 350 kcal and 20 grams of protein (vanilla flavor)  - Pro-stat 30 ml BID, each supplement provides 100 kcals and 15 grams protein  NUTRITION DIAGNOSIS:   Severe Malnutrition related to chronic illness (CKD stage IV) as evidenced by severe fat depletion, severe muscle depletion.  GOAL:   Patient will meet greater than or equal to 90% of their needs, Weight gain  MONITOR:   PO intake, Supplement acceptance, Weight trends, Labs, Skin  REASON FOR ASSESSMENT:   Malnutrition Screening Tool, Consult Assessment of nutrition requirement/status  ASSESSMENT:   85 year old male who presented to the ED on 1/20 with fever. PMH significant for kidney transplant several decades ago, CKD stage IV, atrial fibrillation, anemia, HLD, HTN. Pt admitted with ARF.  Spoke with pt and family member at bedside. Pt eating cookies and ordering dinner and breakfast meals at time of visit. For dinner, pt ordered chicken pot pie, ginger ale, and vanilla ice cream. For breakfast, pt ordered eggs, sausage, biscuit, and orange juice.  Spoke with pt's family member who reports pt ate at least 90% of breakfast meal this morning that included toast, grits, sausage, and eggs.  Pt's family member and pt share that pt has a good appetite but that recently his appetite decreased due to getting sick around the holidays. Pt's family member states that he believes pt did not really get better after that illness.  Pt reports that he typically eats 2-3 meals daily.  Breakfast: honey bun or liver pudding with eggs and grits Lunch: homemade soup Dinner: leftovers or Big Mac from [employer]  Pt endorses weight loss over the last 5+ years. Pt reports his usual adult body weight as "in the 200's." Pt's family member believes pt weighed  295 lbs at one point. Pt states that he weighed 225 lbs at the end of November and "lost weight quick."  Per weight history in chart, pt with 6.2 kg weight loss since 03/28/18. This is a 9.9% weight loss in 6 months which is not quite significant for timeframe.  Meal Completion: 50-75%  Medications reviewed and include: Pepcid, Ensure Enlive TID, Pro-stat TID, Lasix, IV antibiotics  Labs reviewed: BUN 57 (H), creatinine 2.01 (H), hemoglobin 6.6 (L)  NUTRITION - FOCUSED PHYSICAL EXAM:    Most Recent Value  Orbital Region  Severe depletion  Upper Arm Region  Severe depletion  Thoracic and Lumbar Region  Severe depletion  Buccal Region  Severe depletion  Temple Region  Severe depletion  Clavicle Bone Region  Severe depletion  Clavicle and Acromion Bone Region  Severe depletion  Scapular Bone Region  Severe depletion  Dorsal Hand  Severe depletion  Patellar Region  Severe depletion  Anterior Thigh Region  Severe depletion  Posterior Calf Region  Severe depletion  Edema (RD Assessment)  None  Hair  Reviewed  Eyes  Reviewed  Mouth  Reviewed  Skin  Reviewed  Nails  Reviewed       Diet Order:   Diet Order            DIET SOFT Room service appropriate? Yes; Fluid consistency: Thin  Diet effective now              EDUCATION NEEDS:   Education needs have been addressed  Skin:  Skin Assessment: Skin Integrity Issues: Stage II: coccyx  Last BM:  1/22 (medium type 5)  Height:   Ht Readings from Last 1 Encounters:  10/01/18 _0  (1.905 m)    Weight:   Wt Readings from Last 1 Encounters:  10/01/18 56.5 kg    Ideal Body Weight:  89.1 kg  BMI:  Body mass index is 15.57 kg/m.  Estimated Nutritional Needs:   Kcal:  1700-1900  Protein:  85-100 grams  Fluid:  >/= 1.7 L    Gaynell Face, MS, RD, LDN Inpatient Clinical Dietitian Pager: [phone] Weekend/After Hours: [phone]

## 2018-10-02 NOTE — Consult Note (Signed)
Consultation Note Date: 10/02/2018   Patient Name: [name] [name]  DOB: 1928-01-20  MRN: [id number]  Age / Sex: 85 y.o., male  PCP: [name] Area, MD Referring Physician: Bonnell Public, MD  Reason for Consultation: Establishing goals of care  HPI/Patient Profile: 85 y.o. male  with past medical history of CKD s/p transplant x 2, COPD, asbestos exposure, A-fib, chronic anemia admitted on 09/30/2018 with weakness, fever. Workup reveals acute on chronic kidney injury, dehydration, UTI, anemia. Bladder ultra sound + wall thickening with recommendations for cystoscopy. Fecal occult +. Palliative medicine consulted for "GOC and code status".    Clinical Assessment and Goals of Care:  I have reviewed medical records including EPIC notes, labs and imaging, received report from patient's RN, assessed the patient and then met at the bedside along with patient's daughterDanelle [name],  to discuss diagnosis prognosis, GOC, EOL wishes, disposition and options.  I introduced Palliative Medicine as specialized medical care for people living with serious illness. It focuses on providing relief from the symptoms and stress of a serious illness. The goal is to improve quality of life for both the patient and the family.  We discussed a brief life review of the patient. He has two children, spouse is deceased. He worked until he was disabled due to his kidney disease in the 1970's.   As far as functional and nutritional status - his daughter recalls significant decline especially over the last year. He has lost approx 14 pounds in the last six months (down to 124 pounds from 138 in July- this is a 10% loss which is significant). He has had decrease in his ability to ambulate, and has been noted to be weaker. She has also noted changes in his cognitive functioning- he is not taking his medications and is forgetting to eat and  drink.  We discussed her current illness and what it means in the larger context of her on-going co-morbidities.  Natural disease trajectory and expectations at EOL were discussed. We discussed his multiple comorbidities- CKD, likely dementia, anemia, now possible bladder cancer. Discussed what interventions and limitations patient would prefer. She notes that previously when patient had sclerotic bone lesions on CT scan in July 2019 he had expressed he would not want treatment for any type of cancer.   The difference between aggressive medical intervention and comfort care was explained and considered in light of the patient's goals of care.   During our discussion the patient was sitting in the chair, confused at times. He complained of the IV in place, he complained of stomach pain, nausea. He was not aware that he was in the hospital. He asked multiple questions and became upset/agitated at times with medical care.   Advanced directives, concepts specific to code status, artifical feeding and hydration, and rehospitalization were considered and discussed. The patient does not have written directives, however, [name] [name] states he would not want to be kept alive artificially on ventilator or by other means. We discussed CODE STATUS. DNR status was discussed- [name] [name] believes  DNR would align with patient's [facility] but needs to discuss with her brother.  Hospice and Palliative Care services outpatient were explained and offered.  Questions and concerns were addressed.  Hard Choices booklet left for review. The family was encouraged to call with questions or concerns.   Primary Decision Maker NEXT OF KIN- patient's children    SUMMARY OF RECOMMENDATIONS -Continue full scope care for now- patient's daughter to discuss possible comfort care and Hospice with her brother- followup meeting planned tomorrow at 70 -Ondansetron 69m ODT x1 for patient complaint of nausea -Zyprexa 545morally disintegrating  tablet QHS for sleep and agitation      Code Status/Advance Care Planning:  Full code  Palliative Prophylaxis:   Delirium Protocol and Frequent Pain Assessment  Additional Recommendations (Limitations, Scope, Preferences):  No Chemotherapy  Prognosis:    Unable to determine  Discharge Planning: To Be Determined  Primary Diagnoses: Present on Admission: . BPH (benign prostatic hyperplasia) . Hypotension . Protein-calorie malnutrition, severe . SIRS (systemic inflammatory response syndrome) (HCC) . AR[place] (acute renal failure) ([facility]  I have reviewed the medical record, interviewed the patient and family, and examined the patient. The following aspects are pertinent.  Past Medical History:  Diagnosis Date  . Anemia in CKD (chronic kidney disease)   . Arthritis   . Bilateral hydrocele   . CKD (chronic kidney disease), stage II    nephrologist-  deterding  . Diverticulosis   . Dysuria   . H/O kidney transplant    right 1980/  left 1994 with right transplanted kidney removal  . Hematuria   . History of adenomatous polyp of colon    VILLOUS ADENOMA POLYP  . History of asbestos exposure   . History of condyloma acuminatum    genital  . 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 History of renal cell carcinoma    S/P  BILATERAL NEPHRECTOMY AND RENAL TRANSPLANTS  . History of small bowel obstruction    12/ 2006  due to adhesions -- resolved without surgerical intervention  . History of TIA (transient ischemic attack)   . Hyperlipidemia   . Hypertension   . Proteinuria   . Secondary hyperparathyroidism, renal ([facility]  . Wears glasses    Social History   Socioeconomic History  . Marital status: Widowed    Spouse name: Not on file  . Number of children: Not on file  . Years of education: Not on file  . Highest education level: Not on file  Occupational History  . Not on file  Social Needs  . Financial resource strain: Not on file  .  Food insecurity:    Worry: Not on file    Inability: Not on file  . Transportation needs:    Medical: Not on file    Non-medical: Not on file  Tobacco Use  . Smoking status: Never Smoker  . Smokeless tobacco: Never Used  Substance and Sexual Activity  . Alcohol use: No  . Drug use: No  . Sexual activity: Not Currently    Birth control/protection: None  Lifestyle  . Physical activity:    Days per week: Not on file    Minutes per session: Not on file  . Stress: Not on file  Relationships  . Social connections:    Talks on phone: Not on file    Gets together: Not on file    Attends religious service: Not on file  Active member of club or organization: Not on file    Attends meetings of clubs or organizations: Not on file    Relationship status: Not on file  Other Topics Concern  . Not on file  Social History Narrative  . Not on file   History reviewed. No pertinent family history. Scheduled Meds: . sodium chloride   Intravenous Once  . apixaban  2.5 mg Oral BID  . aspirin  81 mg Oral Daily  . cycloSPORINE modified  125 mg Oral BID  . diltiazem  180 mg Oral Daily  . famotidine  20 mg Oral QHS  . feeding supplement (ENSURE ENLIVE)  237 mL Oral TID BM  . feeding supplement (PRO-STAT SUGAR FREE 64)  30 mL Oral BID BM  . mycophenolate  180 mg Oral BID  . predniSONE  5 mg Oral Q breakfast  . tamsulosin  0.4 mg Oral QPC supper   Continuous Infusions: . cefTRIAXone (ROCEPHIN)  IV 1 g (10/01/18 1711)   PRN Meds:.acetaminophen, docusate sodium, ipratropium-albuterol, ondansetron (ZOFRAN) IV Medications Prior to Admission:  Prior to Admission medications   Medication Sig Start Date End Date Taking? Authorizing Provider  acetaminophen (TYLENOL) 500 MG tablet Take 500 mg by mouth daily as needed for fever or headache (pain).   Yes [provider]  apixaban (ELIQUIS) 2.5 MG TABS tablet Take 2.5 mg by mouth 2 (two) times daily.   Yes [provider]  aspirin  81 MG chewable tablet Chew 1 tablet (81 mg total) by mouth daily. 03/30/18  Yes Regalado, Belkys A, MD  cycloSPORINE modified (NEORAL) 100 MG capsule Take 100 mg by mouth See admin instructions. Take one capsule (100 mg) by mouth twice daily with a 25 mg capsule for a total dose of 125 mg   Yes [provider]  cycloSPORINE modified (NEORAL) 25 MG capsule Take 25 mg by mouth See admin instructions. Take one capsule (25 mg) by mouth twice daily with a 100 mg capsule for a total dose of 125 mg   Yes [provider]  diltiazem (CARDIZEM CD) 180 MG 24 hr capsule Take 1 capsule (180 mg total) by mouth daily. 04/23/18  Yes Lendon Colonel, NP  docusate sodium (COLACE) 100 MG capsule Take 100 mg by mouth daily.    Yes [provider]  famotidine (PEPCID) 20 MG tablet Take 1 tablet (20 mg total) by mouth at bedtime. 03/29/18  Yes Regalado, Belkys A, MD  feeding supplement, ENSURE ENLIVE, (ENSURE ENLIVE) LIQD Take 237 mLs by mouth 3 (three) times daily between meals. Patient taking differently: Take 237 mLs by mouth 2 (two) times daily between meals.  03/29/18  Yes Regalado, Belkys A, MD  mycophenolate (MYFORTIC) 180 MG EC tablet Take 180 mg by mouth 2 (two) times daily.   Yes [provider]  predniSONE (DELTASONE) 5 MG tablet Take 5 mg by mouth daily with breakfast.   Yes [provider]  HYDROcodone-acetaminophen (NORCO) 5-325 MG tablet Take 1 tablet by mouth every 6 (six) hours as needed for moderate pain. Patient not taking: Reported on 09/30/2018 07/13/15   Irine Seal, MD   Allergies  Allergen Reactions  . Ciprofloxacin Rash  . Clindamycin/Lincomycin Rash  . Levofloxacin Rash  . Septra [Sulfamethoxazole-Trimethoprim] Rash   Review of Systems  Unable to perform ROS: Dementia    Physical Exam Vitals signs and nursing note reviewed.  Constitutional:      Comments: cachectic  Cardiovascular:     Rate and Rhythm:  Normal rate.     Pulses: Normal  pulses.  Pulmonary:     Effort: Pulmonary effort is normal.  Neurological:     Mental Status: He is disoriented.  Psychiatric:     Comments: Easily agitated     Vital Signs: BP 93/78   Pulse 72   Temp (!) 97.4 F (36.3 C) (Oral)   Resp 16   Ht _0  (1.905 m)   Wt 56.5 kg   SpO2 100%   BMI 15.57 kg/m  Pain Scale: 0-10   Pain Score: 0-No pain   SpO2: SpO2: 100 % O2 Device:SpO2: 100 % O2 Flow Rate: .   IO: Intake/output summary:   Intake/Output Summary (Last 24 hours) at 10/02/2018 1522 Last data filed at 10/02/2018 0900 Gross per 24 hour  Intake 610 ml  Output 700 ml  Net -90 ml    LBM: Last BM Date: 09/29/18 Baseline Weight: Weight: 60.8 kg Most recent weight: Weight: 56.5 kg     Palliative Assessment/Data: PPS: 40%   Flowsheet Rows     Most Recent Value  Intake Tab  Referral Department  Hospitalist  Unit at Time of Referral  Med/Surg Unit  Palliative Care Primary Diagnosis  Other (Comment) [CKD]  Date Notified  10/02/18  Palliative Care Type  New Palliative care  Reason for referral  Clarify Goals of Care  Date of Admission  09/30/18  # of days IP prior to Palliative referral  2  Clinical Assessment  Psychosocial & Spiritual Assessment  Palliative Care Outcomes      Thank you for this consult. Palliative medicine will continue to follow and assist as needed.   Time In: 1300 Time Out: 1430 Time Total: 90 minutes Prolonged services billed: yes  Greater than 50%  of this time was spent counseling and coordinating care related to the above assessment and plan.  Signed by: Mariana Kaufman, AGNP-C Palliative Medicine    Please contact Palliative Medicine Team phone at [phone] for questions and concerns.  For individual provider: See Shea Evans

## 2018-10-02 NOTE — Progress Notes (Signed)
CRITICAL VALUE ALERT  Critical Value:  Hgb 6.6  Date & Time Notied:  10/02/2018 0558  Provider Notified: NP Schorr   Orders Received/Actions taken: Awaiting response

## 2018-10-02 NOTE — Consult Note (Signed)
Cardiology Consultation:   Patient ID: [name] [name] MRN: [id number]; DOB: [birth year]  Admit date: 09/30/2018 Date of Consult: 10/02/2018  Primary Care Provider: Mauricia Area, MD Primary Cardiologist: Pixie Casino, MD  Primary Electrophysiologist:  None    Patient Profile:   [name] [name] is a 85 y.o. male with a hx of PAF, CKD s/p renal transplant, HTN, TIA on eliquis at 2.5 BID who is being seen today for the evaluation of anticoagulation with new GI bleed at the request of Dr. Marthenia Rolling.  History of Present Illness:   [name] [name] with hx above and new a fib with RVR in 03/2018 in combination with infection.  He was placed on Eliquis and IV dilt.  Echo with normal LV function, mild AI, mod to severe LAE plan for rate control.  Did have some brady so dilt to 180 mg daily.  He has continued a fib with rate control.      Pt now admitted 09/30/18 with fever, of 102, lactic acid 2.75, troponin poc 0.02 - his Hgb dropped to 6.6 from 9.7 on admit. Cr of 2.49 and baseline 1.8, neg for flu.    EKG:  The EKG was personally reviewed and demonstrates:  Appears SR with LVH and no acute changes, but some leads still appears a fib  Telemetry:  Telemetry was personally reviewed and demonstrates:  SR with short bursts of SVT.    Since admit he has had urinary retention with I&O caths.   Today his Hgb came back at 6.6.  HCT 22  Stool heme +,   CT of chest  IMPRESSION: 1. Asbestos related pleural disease redemonstrated. New small bilateral pleural effusions are identified. 2. Diffuse bronchial wall thickening with emphysema, as above; imaging findings suggestive of underlying COPD. Nonspecific mild peribronchovascular predominant ground-glass attenuation within the mid and upper lung zones bilaterally, stable compared to previous exam. 3. New nodular density filling a posterior left lower lobe emphysematous bulla is likely postinflammatory or infectious in etiology. Recommend followup imaging in  3 months with repeat CT of the chest to ensure resolution. 4. Aortic atherosclerosis as well as left main and 3 vessel coronary artery disease. 5. Aortic Atherosclerosis (ICD10-I70.0) and Emphysema (ICD10-J43.9).  Renal ultrasound IMPRESSION: There is a moderate amount of fluid in the bladder and mild hydronephrosis of the left lower quadrant transplant kidney. Focal wall thickening along the posterior wall of the bladder is noted. Transitional cell carcinoma is not excluded. Consider cystoscopy.   BP today 95/76 --yesterday 117/82.  Afebrile.    Currently sitting up in chair , his daughter is with him.  No chest pain but did haves some SOB this AM.   His wt continues to decrease, he was 130 lbs 04/23/18 and now 124 lbs.  His daughter notes poor appetite.    Past Medical History:  Diagnosis Date  . Anemia in CKD (chronic kidney disease)   . Arthritis   . Bilateral hydrocele   . CKD (chronic kidney disease), stage II    nephrologist-  deterding  . Diverticulosis   . Dysuria   . H/O kidney transplant    right 1980/  left 1994 with right transplanted kidney removal  . Hematuria   . History of adenomatous polyp of colon    VILLOUS ADENOMA POLYP  . History of asbestos exposure   . History of condyloma acuminatum    genital  . 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 History of  renal cell carcinoma    S/P  BILATERAL NEPHRECTOMY AND RENAL TRANSPLANTS  . History of small bowel obstruction    12/ 2006  due to adhesions -- resolved without surgerical intervention  . History of TIA (transient ischemic attack)   . Hyperlipidemia   . Hypertension   . Proteinuria   . Secondary hyperparathyroidism, renal ([place])   . Wears glasses     Past Surgical History:  Procedure Laterality Date  . COLONOSCOPY  10/17/2012   Procedure: COLONOSCOPY;  Surgeon: Beryle Beams, MD;  Location: [facility];  Service: Endoscopy;  Laterality: N/A;  . CYSTOSCOPY N/A 07/13/2015     Procedure: CYSTOSCOPY;  Surgeon: Irine Seal, MD;  Location: [facility];  Service: Urology;  Laterality: N/A;  . HEMICOLECTOMY Right 1995   adenoma polyp  . HYDROCELE EXCISION Bilateral 07/13/2015   Procedure: RIGHT HYDROCELECTOMY, ADULT DRAINAGE OF LEFT HYDROCELE;  Surgeon: Irine Seal, MD;  Location: [facility];  Service: Urology;  Laterality: Bilateral;  . KIDNEY TRANSPLANT  right 1980; left 1994---  [facility]   right kidney removed 0347/  left kidney removed 1994     Home Medications:  Prior to Admission medications   Medication Sig Start Date End Date Taking? Authorizing Provider  acetaminophen (TYLENOL) 500 MG tablet Take 500 mg by mouth daily as needed for fever or headache (pain).   Yes [provider]  apixaban (ELIQUIS) 2.5 MG TABS tablet Take 2.5 mg by mouth 2 (two) times daily.   Yes [provider]  aspirin 81 MG chewable tablet Chew 1 tablet (81 mg total) by mouth daily. 03/30/18  Yes Regalado, Belkys A, MD  cycloSPORINE modified (NEORAL) 100 MG capsule Take 100 mg by mouth See admin instructions. Take one capsule (100 mg) by mouth twice daily with a 25 mg capsule for a total dose of 125 mg   Yes [provider]  cycloSPORINE modified (NEORAL) 25 MG capsule Take 25 mg by mouth See admin instructions. Take one capsule (25 mg) by mouth twice daily with a 100 mg capsule for a total dose of 125 mg   Yes [provider]  diltiazem (CARDIZEM CD) 180 MG 24 hr capsule Take 1 capsule (180 mg total) by mouth daily. 04/23/18  Yes Lendon Colonel, NP  docusate sodium (COLACE) 100 MG capsule Take 100 mg by mouth daily.    Yes [provider]  famotidine (PEPCID) 20 MG tablet Take 1 tablet (20 mg total) by mouth at bedtime. 03/29/18  Yes Regalado, Belkys A, MD  feeding supplement, ENSURE ENLIVE, (ENSURE ENLIVE) LIQD Take 237 mLs by mouth 3 (three) times daily between meals. Patient taking differently: Take 237 mLs by  mouth 2 (two) times daily between meals.  03/29/18  Yes Regalado, Belkys A, MD  mycophenolate (MYFORTIC) 180 MG EC tablet Take 180 mg by mouth 2 (two) times daily.   Yes [provider]  predniSONE (DELTASONE) 5 MG tablet Take 5 mg by mouth daily with breakfast.   Yes [provider]  HYDROcodone-acetaminophen (NORCO) 5-325 MG tablet Take 1 tablet by mouth every 6 (six) hours as needed for moderate pain. Patient not taking: Reported on 09/30/2018 07/13/15   Irine Seal, MD    Inpatient Medications: Scheduled Meds: . sodium chloride   Intravenous Once  . apixaban  2.5 mg Oral BID  . aspirin  81 mg Oral Daily  . cycloSPORINE modified  125 mg Oral BID  . diltiazem  180 mg Oral Daily  .  famotidine  20 mg Oral QHS  . feeding supplement (ENSURE ENLIVE)  237 mL Oral TID BM  . feeding supplement (PRO-STAT SUGAR FREE 64)  30 mL Oral BID BM  . mycophenolate  180 mg Oral BID  . OLANZapine zydis  5 mg Oral QHS  . predniSONE  5 mg Oral Q breakfast  . tamsulosin  0.4 mg Oral QPC supper   Continuous Infusions: . cefTRIAXone (ROCEPHIN)  IV 1 g (10/01/18 1711)   PRN Meds: acetaminophen, docusate sodium, ipratropium-albuterol, ondansetron (ZOFRAN) IV  Allergies:    Allergies  Allergen Reactions  . Ciprofloxacin Rash  . Clindamycin/Lincomycin Rash  . Levofloxacin Rash  . Septra [Sulfamethoxazole-Trimethoprim] Rash    Social History:   Social History   Socioeconomic History  . Marital status: Widowed    Spouse name: Not on file  . Number of children: Not on file  . Years of education: Not on file  . Highest education level: Not on file  Occupational History  . Not on file  Social Needs  . Financial resource strain: Not on file  . Food insecurity:    Worry: Not on file    Inability: Not on file  . Transportation needs:    Medical: Not on file    Non-medical: Not on file  Tobacco Use  . Smoking status: Never Smoker  . Smokeless tobacco: Never Used  Substance and  Sexual Activity  . Alcohol use: No  . Drug use: No  . Sexual activity: Not Currently    Birth control/protection: None  Lifestyle  . Physical activity:    Days per week: Not on file    Minutes per session: Not on file  . Stress: Not on file  Relationships  . Social connections:    Talks on phone: Not on file    Gets together: Not on file    Attends religious service: Not on file    Active member of club or organization: Not on file    Attends meetings of clubs or organizations: Not on file    Relationship status: Not on file  . Intimate partner violence:    Fear of current or ex partner: Not on file    Emotionally abused: Not on file    Physically abused: Not on file    Forced sexual activity: Not on file  Other Topics Concern  . Not on file  Social History Narrative  . Not on file    Family History:    Family History  Problem Relation Age of Onset  . Hypertension Mother      ROS:  Please see the history of present illness.  General:no colds or fevers, + continued weight loss and poor appetite.   Skin:no rashes or ulcers HEENT:no blurred vision, no congestion CV:see HPI PUL:see HPI GI:no diarrhea constipation + melena now, no indigestion GU:no hematuria, no dysuria-hx of renal transplant MS:no joint pain, no claudication Neuro:no syncope, no lightheadedness, hx of TIA Endo:no diabetes, no thyroid disease  All other ROS reviewed and negative.     Physical Exam/Data:   Vitals:   10/02/18 0734 10/02/18 1238 10/02/18 1240 10/02/18 1303  BP: 117/82 95/76  93/78  Pulse: 62 70  72  Resp: 18 18  16   Temp: (!) 97.3 F (36.3 C)  (!) 97.4 F (36.3 C) (!) 97.4 F (36.3 C)  TempSrc: Oral  Oral Oral  SpO2: 100% 100%  100%  Weight:      Height:  Intake/Output Summary (Last 24 hours) at 10/02/2018 1543 Last data filed at 10/02/2018 1300 Gross per 24 hour  Intake 830 ml  Output 1000 ml  Net -170 ml   Last 3 Weights 10/01/2018 10/01/2018 09/30/2018  Weight  (lbs) 124 lb 9 oz 124 lb 12.5 oz 134 lb  Weight (kg) 56.5 kg 56.6 kg 60.782 kg     Body mass index is 15.57 kg/m.  General:  Frail male, in no acute distress, sitting in chair HEENT: normal Lymph: no adenopathy Neck: no JVD sitting up right Endocrine:  No thryomegaly Vascular: No carotid bruits; pedal pulses 2+ bilaterally  Cardiac:  normal S1, S2; RRR; no murmur gallup rub or click.   Lungs:  clear to auscultation bilaterally, no wheezing, rhonchi or rales  Abd: soft, nontender, no hepatomegaly  Ext: no edema Musculoskeletal:  No deformities, BUE and BLE strength normal and equal Skin: warm and dry  Neuro:  Alert and oriented X 3 MAE follows commands no focal abnormalities noted Psych:  Normal affect   Relevant CV Studies: Echo 03/27/18 Study Conclusions  - Left ventricle: The cavity size was normal. There was mild   concentric hypertrophy. Systolic function was normal. The   estimated ejection fraction was in the range of 55% to 60%. Wall   motion was normal; there were no regional wall motion   abnormalities. - Aortic valve: Transvalvular velocity was within the normal range.   There was no stenosis. There was mild regurgitation. - Aorta: Ascending aortic diameter: 39 mm (S). - Ascending aorta: The ascending aorta was mildly dilated. - Mitral valve: Transvalvular velocity was within the normal range.   There was no evidence for stenosis. There was no regurgitation. - Left atrium: The atrium was moderately to severely dilated. - Right ventricle: The cavity size was normal. Wall thickness was   normal. Systolic function was normal. - Atrial septum: No defect or patent foramen ovale was identified   by color flow Doppler. - Tricuspid valve: There was moderate regurgitation. - Pulmonary arteries: Systolic pressure was mildly increased. PA   peak pressure: 40 mm Hg (S).   Laboratory Data:  Chemistry Recent Labs  Lab 09/30/18 1500 10/01/18 0505 10/02/18 0412  NA 139  138 139  K 4.9 4.5 4.7  CL 103 106 104  CO2 25 23 25   GLUCOSE 97 101* 118*  BUN 53* 51* 57*  CREATININE 2.49* 2.21* 2.01*  CALCIUM 9.1 8.6* 8.7*  GFRNONAA 22* 26* 29*  GFRAA 26* 30* 33*  ANIONGAP 11 9 10     Recent Labs  Lab 09/30/18 1500  PROT 6.9  ALBUMIN 2.0*  AST 24  ALT 14  ALKPHOS 76  BILITOT 1.1   Hematology Recent Labs  Lab 10/01/18 0505 10/01/18 0950 10/02/18 0412  WBC 4.8 5.9 4.7  RBC 2.31* 2.45*  2.45* 2.21*  HGB 7.2* 7.6* 6.6*  HCT 23.1* 24.6* 22.1*  MCV 100.0 100.4* 100.0  MCH 31.2 31.0 29.9  MCHC 31.2 30.9 29.9*  RDW 16.9* 16.7* 16.5*  PLT 181 200 176   Cardiac EnzymesNo results for input(s): TROPONINI in the last 168 hours.  Recent Labs  Lab 09/30/18 1648  TROPIPOC 0.02    BNPNo results for input(s): BNP, PROBNP in the last 168 hours.  DDimer No results for input(s): DDIMER in the last 168 hours.  Radiology/Studies:  Ct Chest Wo Contrast  Result Date: 10/01/2018 CLINICAL DATA:  Evaluate for lung nodule. EXAM: CT CHEST WITHOUT CONTRAST TECHNIQUE: Multidetector CT imaging of the chest  was performed following the standard protocol without IV contrast. COMPARISON:  CT chest 07/22/2018 FINDINGS: Cardiovascular: Normal heart size. No pericardial effusion. Aortic atherosclerosis. Aneurysmal dilatation of the ascending thoracic aorta measures up to 4.3 cm. Multi vessel coronary artery atherosclerotic calcifications including left main disease noted. Mediastinum/Nodes: Normal appearance of the thyroid gland. The trachea appears patent and is midline. Small hiatal hernia suspected. No mediastinal adenopathy. No enlarged axillary lymph nodes. Lungs/Pleura: There are small bilateral pleural effusions which appear new from previous exam. Bilateral pleural calcifications are identified compatible with prior asbestos exposure. Advanced changes of emphysema noted. Patchy peribronchovascular predominant areas of mild ground-glass attenuation, most evident throughout  the mid to upper lungs bilaterally, similar compared to the prior study. Scattered calcified granulomas are noted in the lungs bilaterally. Emphysematous bulla within the posterior left lower lobe has a new nodular filling defect measuring 1.4 cm, image 77/8 and corresponds to the nodular density identified on recent chest radiograph. Upper Abdomen: No acute abnormality. Musculoskeletal: No chest wall mass or suspicious bone lesions identified. The patient appears diffusely cachectic. IMPRESSION: 1. Asbestos related pleural disease redemonstrated. New small bilateral pleural effusions are identified. 2. Diffuse bronchial wall thickening with emphysema, as above; imaging findings suggestive of underlying COPD. Nonspecific mild peribronchovascular predominant ground-glass attenuation within the mid and upper lung zones bilaterally, stable compared to previous exam. 3. New nodular density filling a posterior left lower lobe emphysematous bulla is likely postinflammatory or infectious in etiology. Recommend followup imaging in 3 months with repeat CT of the chest to ensure resolution. 4. Aortic atherosclerosis as well as left main and 3 vessel coronary artery disease. 5. Aortic Atherosclerosis (ICD10-I70.0) and Emphysema (ICD10-J43.9). Electronically Signed   By: Kerby Moors M.D.   On: 10/01/2018 19:37   US Renal  Result Date: 10/01/2018 CLINICAL DATA:  Urinary retention EXAM: ULTRASOUND OF RENAL TRANSPLANT TECHNIQUE: Ultrasound examination of the renal transplant was performed with gray-scale and color Doppler evaluation. COMPARISON:  None. FINDINGS: Transplant kidney location:  LLQ Transplant kidney description: Renal measurements: 9.3 x 5.9 x 4.2 cm = volume: 124.76mL. Normal cortical echogenicity. No evidence of mass. Mild hydronephrosis is present. Color flow in the main renal artery at the hilum:  Yes Color flow in the main renal vein at the hilum:  Yes Bladder: There is focal wall thickening along the  posterior wall of the bladder measuring up to 1.5 cm in thickness. There is a moderate amount of fluid in the bladder. Other findings:  No perinephric fluid collections. IMPRESSION: There is a moderate amount of fluid in the bladder and mild hydronephrosis of the left lower quadrant transplant kidney. Focal wall thickening along the posterior wall of the bladder is noted. Transitional cell carcinoma is not excluded. Consider cystoscopy. Electronically Signed   By: Marybelle Killings M.D.   On: 10/01/2018 18:28   Dg Chest Port 1 View  Result Date: 09/30/2018 CLINICAL DATA:  Possible seizure today. Fever of 102 since this afternoon. EXAM: PORTABLE CHEST 1 VIEW COMPARISON:  03/26/2018 and chest CT dated 07/22/2018. FINDINGS: Normal sized heart. Tortuous and mildly calcified thoracic aorta. The lungs remain hyperexpanded with bullous changes and previously demonstrated bilateral calcified pleural plaques. Careful poorly defined rounded nodular density at the left lung base. The interstitial markings remain mildly prominent. Diffuse osteopenia. IMPRESSION: 1. Interval poorly defined rounded nodular density at the left lung base. This could represent a true nodule or rounded focus of pneumonia or atelectasis. This could be further evaluated with a chest CT with  contrast. 2. Stable changes of COPD and bilateral calcified pleural plaques compatible with previous asbestos exposure. Electronically Signed   By: Claudie Revering M.D.   On: 09/30/2018 15:24    Assessment and Plan:   1. Fever with SIRS,  Possible UTI on ABx, blood cultures pending, per IM  2. ARF with baseline cr of 1.8 and now 2.4 -  Gentle hydration with improvement 3. Hx of renal transplant.  On chronically immunosuppressed on prednisone, mycophenolate and cyclosporine 4. Urinary retention with BPH and on renal ultrasound possible bladder cancer. 5. Persistent a fib on last EKG but now in SR with SVT.  On Eliquis 2.5 BID until today with acute blood loss  anemia, he was Hgb 9.7 on admit.  With new GI bleed and continued decline in general health would favor stopping eliquis.  His CHa2DS2Vasc score of 5.  DR. Spence Soberano to see.  6. abnormal renal ultrasound.  Palliative Care has seen 7. CAD by CTA of chest but asymptomatic.   8. Acute blood loss anemia with Hgb 9.7 on admit and now 6.6 transfused 1 uPRBCs.  hgb in July 11.8       For questions or updates, please contact [facility] Please consult [email or web address] for contact info under     Signed, Cecilie Kicks, NP  10/02/2018 3:43 PM   Attending Note:   The patient was seen and examined.  Agree with assessment and plan as noted above.  Changes made to the above note as needed.  Patient seen and independently examined with Cecilie Kicks, NP .   We discussed all aspects of the encounter. I agree with the assessment and plan as stated above.  1.   History of atrial fibrillation with rapid ventricular response: The patient has a history of atrial fibrillation that was noticed during his last admission.  He was started on Eliquis.  Unfortunately he developed SIRS and also a urinary tract infection which resulted in acute renal insufficiency.  He had continue the Eliquis and subsequently developed a GI bleed.  I suspect that his Eliquis levels increased abruptly with the onset of the acute renal failure.  Eliquis  is now been stopped.  He seems to be stabilized. His hemoglobin remains low.  Fortunately, he has gone back into NSR   He is quite frail. Hope is that his renal function will improve with antibiotics and after he is over this episode of SIRS.  He had a fairly significant GI bleed.  Not  sure that we should restart any oral anticoagulation even after he is improved given his risks.  CAD:   Has coronary calcifications.  No angina      I have spent a total of 40 minutes with patient reviewing hospital  notes , telemetry, EKGs, labs and examining patient as well as establishing an  assessment and plan that was discussed with the patient. > 50% of time was spent in direct patient care.    [name] [name], Brooke Bonito., MD, [facility] 10/02/2018, 4:57 PM [address]  [place] Pager [phone]

## 2018-10-03 DIAGNOSIS — D62 Acute posthemorrhagic anemia: Secondary | ICD-10-CM

## 2018-10-03 DIAGNOSIS — E8809 Other disorders of plasma-protein metabolism, not elsewhere classified: Secondary | ICD-10-CM

## 2018-10-03 DIAGNOSIS — I482 Chronic atrial fibrillation, unspecified: Secondary | ICD-10-CM

## 2018-10-03 LAB — CBC WITH DIFFERENTIAL/PLATELET
Abs Immature Granulocytes: 0.03 10*3/uL (ref 0.00–0.07)
Basophils Absolute: 0 10*3/uL (ref 0.0–0.1)
Basophils Relative: 0 %
Eosinophils Absolute: 0 10*3/uL (ref 0.0–0.5)
Eosinophils Relative: 1 %
HCT: 31.3 % — ABNORMAL LOW (ref 39.0–52.0)
Hemoglobin: 10.2 g/dL — ABNORMAL LOW (ref 13.0–17.0)
Immature Granulocytes: 1 %
Lymphocytes Relative: 14 %
Lymphs Abs: 0.9 10*3/uL (ref 0.7–4.0)
MCH: 31 pg (ref 26.0–34.0)
MCHC: 32.6 g/dL (ref 30.0–36.0)
MCV: 95.1 fL (ref 80.0–100.0)
Monocytes Absolute: 0.3 10*3/uL (ref 0.1–1.0)
Monocytes Relative: 4 %
Neutro Abs: 5.3 10*3/uL (ref 1.7–7.7)
Neutrophils Relative %: 80 %
Platelets: 170 10*3/uL (ref 150–400)
RBC: 3.29 MIL/uL — ABNORMAL LOW (ref 4.22–5.81)
RDW: 16.7 % — ABNORMAL HIGH (ref 11.5–15.5)
WBC: 6.6 10*3/uL (ref 4.0–10.5)
nRBC: 0 % (ref 0.0–0.2)

## 2018-10-03 LAB — BPAM RBC
Blood Product Expiration Date: 202002222359
Blood Product Expiration Date: 202002222359
ISSUE DATE / TIME: 202001221223
ISSUE DATE / TIME: 202001221617
Unit Type and Rh: 5100
Unit Type and Rh: 5100

## 2018-10-03 LAB — TYPE AND SCREEN
ABO/RH(D): O POS
Antibody Screen: NEGATIVE
UNIT DIVISION: 0
Unit division: 0

## 2018-10-03 LAB — RENAL FUNCTION PANEL
Albumin: 1.8 g/dL — ABNORMAL LOW (ref 3.5–5.0)
Anion gap: 8 (ref 5–15)
BUN: 57 mg/dL — ABNORMAL HIGH (ref 8–23)
CO2: 26 mmol/L (ref 22–32)
Calcium: 8.5 mg/dL — ABNORMAL LOW (ref 8.9–10.3)
Chloride: 103 mmol/L (ref 98–111)
Creatinine, Ser: 1.94 mg/dL — ABNORMAL HIGH (ref 0.61–1.24)
GFR calc Af Amer: 35 mL/min — ABNORMAL LOW (ref >60)
GFR calc non Af Amer: 30 mL/min — ABNORMAL LOW (ref >60)
Glucose, Bld: 90 mg/dL (ref 70–99)
Phosphorus: 2.6 mg/dL (ref 2.5–4.6)
Potassium: 4.6 mmol/L (ref 3.5–5.1)
Sodium: 137 mmol/L (ref 135–145)

## 2018-10-03 LAB — MAGNESIUM: Magnesium: 1.6 mg/dL — ABNORMAL LOW (ref 1.7–2.4)

## 2018-10-03 MED ORDER — MAGNESIUM SULFATE IN D5W 1-5 GM/100ML-% IV SOLN
1.0000 g | Freq: Once | INTRAVENOUS | Status: AC
  Administered 2018-10-03: 1 g via INTRAVENOUS
  Filled 2018-10-03: qty 100

## 2018-10-03 NOTE — Consult Note (Signed)
Consultation: Urinary retention Requested by: Dr. Dana Allan  History of Present Illness: [name] [name] is an 85 year old male who typically follows with Dr. Jeffie Pollock.  [name] [name] was admitted for weakness and fever.  There was some question about urinary retention.  Patient status post kidney transplant and [name] [name] creatinine is 1.9 with a baseline of 1.8.  Renal ultrasound showed mild transplant hydronephrosis and moderately distended bladder.  There was focal posterior wall thickening versus layering debris in the bladder.  Bladder scan read greater than 607 mL and [name] [name] was in and out catheter 350 cc.  [name] [name] was started on tamsulosin and a follow-up bladder scan was 409 mL.  A Foley catheter was placed.  Urine culture grew less than 10,000 insignificant growth.  [name] [name] is tolerating the catheter well.  [name] [name] tells me [name] [name] was voiding well prior to the Foley.  [name] [name] has a history of BPH and cystolitholopaxy.  Also right orchiectomy.  [name] [name] recently saw Dr. Jeffie Pollock November 2019 for possible hematuria but [name] [name] UA was negative in the office.  [name] [name] was also evaluated because a CT scan July 2019 showed some sclerotic bone lesions.  Therefore a PSA was sent and returned normal at 2.22.  [name] [name] had mild to moderate prostate enlargement on the CT.  Past Medical History:  Diagnosis Date  . Anemia in CKD (chronic kidney disease)   . Arthritis   . Bilateral hydrocele   . CKD (chronic kidney disease), stage II    nephrologist-  deterding  . Diverticulosis   . Dysuria   . H/O kidney transplant    right 1980/  left 1994 with right transplanted kidney removal  . Hematuria   . History of adenomatous polyp of colon    VILLOUS ADENOMA POLYP  . History of asbestos exposure   . History of condyloma acuminatum    genital  . 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 History of renal cell carcinoma    S/P  BILATERAL NEPHRECTOMY AND RENAL TRANSPLANTS  . History of small bowel obstruction    12/ 2006  due to adhesions --  resolved without surgerical intervention  . History of TIA (transient ischemic attack)   . Hyperlipidemia   . Hypertension   . Proteinuria   . Secondary hyperparathyroidism, renal ([place])   . Wears glasses    Past Surgical History:  Procedure Laterality Date  . COLONOSCOPY  10/17/2012   Procedure: COLONOSCOPY;  Surgeon: Beryle Beams, MD;  Location: [facility];  Service: Endoscopy;  Laterality: N/A;  . CYSTOSCOPY N/A 07/13/2015   Procedure: CYSTOSCOPY;  Surgeon: Irine Seal, MD;  Location: [facility];  Service: Urology;  Laterality: N/A;  . HEMICOLECTOMY Right 1995   adenoma polyp  . HYDROCELE EXCISION Bilateral 07/13/2015   Procedure: RIGHT HYDROCELECTOMY, ADULT DRAINAGE OF LEFT HYDROCELE;  Surgeon: Irine Seal, MD;  Location: [facility];  Service: Urology;  Laterality: Bilateral;  . KIDNEY TRANSPLANT  right 1980; left 1994---  [facility]   right kidney removed 5170/  left kidney removed 1994    Home Medications:  Medications Prior to Admission  Medication Sig Dispense Refill Last Dose  . acetaminophen (TYLENOL) 500 MG tablet Take 500 mg by mouth daily as needed for fever or headache (pain).   09/29/2018 at Unknown time  . apixaban (ELIQUIS) 2.5 MG TABS tablet Take 2.5 mg by mouth 2 (two) times daily.   09/30/2018 at 1030  . aspirin 81 MG chewable tablet  Chew 1 tablet (81 mg total) by mouth daily. 30 tablet 0 09/30/2018 at am  . cycloSPORINE modified (NEORAL) 100 MG capsule Take 100 mg by mouth See admin instructions. Take one capsule (100 mg) by mouth twice daily with a 25 mg capsule for a total dose of 125 mg   09/30/2018 at am  . cycloSPORINE modified (NEORAL) 25 MG capsule Take 25 mg by mouth See admin instructions. Take one capsule (25 mg) by mouth twice daily with a 100 mg capsule for a total dose of 125 mg   09/30/2018 at am  . diltiazem (CARDIZEM CD) 180 MG 24 hr capsule Take 1 capsule (180 mg total) by mouth daily. 30 capsule 5 09/30/2018 at am  . docusate  sodium (COLACE) 100 MG capsule Take 100 mg by mouth daily.    09/30/2018 at am  . famotidine (PEPCID) 20 MG tablet Take 1 tablet (20 mg total) by mouth at bedtime. 30 tablet 0 09/29/2018 at pm  . feeding supplement, ENSURE ENLIVE, (ENSURE ENLIVE) LIQD Take 237 mLs by mouth 3 (three) times daily between meals. (Patient taking differently: Take 237 mLs by mouth 2 (two) times daily between meals. ) 237 mL 12 09/30/2018 at am  . mycophenolate (MYFORTIC) 180 MG EC tablet Take 180 mg by mouth 2 (two) times daily.   09/30/2018 at am  . predniSONE (DELTASONE) 5 MG tablet Take 5 mg by mouth daily with breakfast.   09/30/2018 at am  . HYDROcodone-acetaminophen (NORCO) 5-325 MG tablet Take 1 tablet by mouth every 6 (six) hours as needed for moderate pain. (Patient not taking: Reported on 09/30/2018) 20 tablet 0 Not Taking at Unknown time   Allergies:  Allergies  Allergen Reactions  . Ciprofloxacin Rash  . Clindamycin/Lincomycin Rash  . Levofloxacin Rash  . Septra [Sulfamethoxazole-Trimethoprim] Rash    Family History  Problem Relation Age of Onset  . Hypertension Mother    Social History:  reports that [name] [name] has never smoked. [name] [name] has never used smokeless tobacco. [name] [name] reports that [name] [name] does not drink alcohol or use drugs.  ROS: A complete review of systems was performed.  All systems are negative except for pertinent findings as noted. Review of Systems  Unable to perform ROS: Age     Physical Exam:  Vital signs in last 24 hours: Temp:  [97.2 F (36.2 C)-97.3 F (36.3 C)] 97.3 F (36.3 C) (01/23 2124) Pulse Rate:  [53-60] 60 (01/23 2124) Resp:  [18-19] 18 (01/23 2124) BP: (125-136)/(87-90) 136/90 (01/23 2124) SpO2:  [100 %] 100 % (01/23 2124) General:  Alert and oriented, No acute distress HEENT: Normocephalic, atraumatic Neck: No JVD or lymphadenopathy Cardiovascular: Regular rate and rhythm Lungs: Regular rate and effort Abdomen: Soft, nontender, nondistended, no abdominal masses Back: No CVA  tenderness Extremities: No edema Neurologic: Grossly intact  Laboratory Data:  Results for orders placed or performed during the hospital encounter of 09/30/18 (from the past 24 hour(s))  Renal function panel     Status: Abnormal   Collection Time: 10/03/18  4:17 AM  Result Value Ref Range   Sodium 137 135 - 145 mmol/L   Potassium 4.6 3.5 - 5.1 mmol/L   Chloride 103 98 - 111 mmol/L   CO2 26 22 - 32 mmol/L   Glucose, Bld 90 70 - 99 mg/dL   BUN 57 (H) 8 - 23 mg/dL   Creatinine, Ser 1.94 (H) 0.61 - 1.24 mg/dL   Calcium 8.5 (L) 8.9 - 10.3 mg/dL   Phosphorus 2.6 2.5 -  4.6 mg/dL   Albumin 1.8 (L) 3.5 - 5.0 g/dL   GFR calc non Af Amer 30 (L) >60 mL/min   GFR calc Af Amer 35 (L) >60 mL/min   Anion gap 8 5 - 15  Magnesium     Status: Abnormal   Collection Time: 10/03/18  4:17 AM  Result Value Ref Range   Magnesium 1.6 (L) 1.7 - 2.4 mg/dL  CBC with Differential/Platelet     Status: Abnormal   Collection Time: 10/03/18  4:17 AM  Result Value Ref Range   WBC 6.6 4.0 - 10.5 K/uL   RBC 3.29 (L) 4.22 - 5.81 MIL/uL   Hemoglobin 10.2 (L) 13.0 - 17.0 g/dL   HCT 31.3 (L) 39.0 - 52.0 %   MCV 95.1 80.0 - 100.0 fL   MCH 31.0 26.0 - 34.0 pg   MCHC 32.6 30.0 - 36.0 g/dL   RDW 16.7 (H) 11.5 - 15.5 %   Platelets 170 150 - 400 K/uL   nRBC 0.0 0.0 - 0.2 %   Neutrophils Relative % 80 %   Neutro Abs 5.3 1.7 - 7.7 K/uL   Lymphocytes Relative 14 %   Lymphs Abs 0.9 0.7 - 4.0 K/uL   Monocytes Relative 4 %   Monocytes Absolute 0.3 0.1 - 1.0 K/uL   Eosinophils Relative 1 %   Eosinophils Absolute 0.0 0.0 - 0.5 K/uL   Basophils Relative 0 %   Basophils Absolute 0.0 0.0 - 0.1 K/uL   Immature Granulocytes 1 %   Abs Immature Granulocytes 0.03 0.00 - 0.07 K/uL   Recent Results (from the past 240 hour(s))  Culture, blood (routine x 2)     Status: None (Preliminary result)   Collection Time: 09/30/18  2:58 PM  Result Value Ref Range Status   Specimen Description BLOOD RIGHT ANTECUBITAL  Final   Special  Requests   Final    BOTTLES DRAWN AEROBIC AND ANAEROBIC Blood Culture adequate volume   Culture   Final    NO GROWTH 3 DAYS Performed at [facility], [address], [place], [place] [address]    Report Status PENDING  Incomplete  Culture, blood (routine x 2)     Status: None (Preliminary result)   Collection Time: 09/30/18  3:02 PM  Result Value Ref Range Status   Specimen Description BLOOD LEFT FOREARM  Final   Special Requests   Final    BOTTLES DRAWN AEROBIC ONLY Blood Culture results may not be optimal due to an inadequate volume of blood received in culture bottles   Culture   Final    NO GROWTH 3 DAYS Performed at [facility], [place] [address] [place], [place] [address]    Report Status PENDING  Incomplete  Culture, Urine     Status: Abnormal   Collection Time: 10/01/18 10:33 PM  Result Value Ref Range Status   Specimen Description URINE, CLEAN CATCH  Final   Special Requests NONE  Final   Culture (A)  Final    <10,000 COLONIES/mL INSIGNIFICANT GROWTH Performed at [facility], [address], [place], [place] [address]    Report Status 10/03/2018 FINAL  Final   Creatinine: Recent Labs    09/30/18 1500 10/01/18 0505 10/02/18 0412 10/03/18 0417  CREATININE 2.49* 2.21* 2.01* 1.94*    Impression/Assessment/plan:  Urinary retention- [name] [name] may not have true retention, but could simply have incomplete bladder emptying.  I would continue the Foley catheter until [name] [name] is a bit stronger  and can ambulate or stand to urinate.  [name] [name] can be discharged with a catheter and follow-up in the office.    BPH-agree with tamsulosin.  [name] [name] PSA was 2.22 when checked in the office last summer which is normal.  Posterior bladder wall thickening- I will send a note to Dr. Jeffie Pollock to consider outpatient cystoscopy.  I will sign off but will send a note to the office for follow-up.  Please page GU with any questions, concerns or changes in patient's status.  Festus Aloe 10/03/2018, 9:38 PM

## 2018-10-03 NOTE — Progress Notes (Signed)
    I note that the family and patient do not any procedures and are discussing care plan with hospice and palliative care Pt has had declining health for the past year  Would continue a very conservative approach to his atrial fib Would not restart anticoagulation   [facility] will sign off.   Medication Recommendations:  Continue conservative approach.    Other recommendations (labs, testing, etc):  No plans for cardiac procedures  Follow up as an outpatient:  With hospice and palliative care      Mertie Moores, MD  10/03/2018 10:18 AM    [address],  [place], [place]  [address] Pager [phone] Phone: [phone]; Fax: [phone]

## 2018-10-03 NOTE — Progress Notes (Signed)
Daily Progress Note   Patient Name: [name] [name]       Date: 10/03/2018 DOB: 12-May-1928  Age: 85 y.o. MRN#: [id number] Attending Physician: Bonnell Public, MD Primary Care Physician: Mauricia Area, MD Admit Date: 09/30/2018  Reason for Consultation/Follow-up: Establishing goals of care  Subjective: Met with patient's daughter and son. Reviewed discussion regarding [place]. Family has determined to set limits of no invasive diagnostic procedures, treat what is treatable for now.  We discussed patient will likely have continued decline once d/c'd from hospital. They are considering home with Hospice vs rehab facility w/ Palliative. Code status was discussed. All agree that DNR best aligns with [place] for patient.   Review of Systems  Unable to perform ROS: Dementia    Length of Stay: 2  Current Medications: Scheduled Meds:  . sodium chloride   Intravenous Once  . aspirin  81 mg Oral Daily  . cycloSPORINE modified  125 mg Oral BID  . diltiazem  180 mg Oral Daily  . famotidine  20 mg Oral QHS  . feeding supplement (ENSURE ENLIVE)  237 mL Oral TID BM  . feeding supplement (PRO-STAT SUGAR FREE 64)  30 mL Oral BID BM  . mycophenolate  180 mg Oral BID  . OLANZapine zydis  5 mg Oral QHS  . predniSONE  5 mg Oral Q breakfast  . tamsulosin  0.4 mg Oral QPC supper    Continuous Infusions: . cefTRIAXone (ROCEPHIN)  IV 1 g (10/02/18 2022)  . magnesium sulfate 1 - 4 g bolus IVPB      PRN Meds: acetaminophen, docusate sodium, ipratropium-albuterol, ondansetron (ZOFRAN) IV  Physical Exam Vitals signs and nursing note reviewed.  Constitutional:      Comments: cachectic  Neurological:     Mental Status: He is alert. He is disoriented.             Vital Signs: BP 125/88 (BP Location:  Left Arm)   Pulse (!) 59   Temp (!) 97.5 F (36.4 C) (Oral)   Resp 18   Ht _0  (1.905 m)   Wt 56.4 kg   SpO2 100%   BMI 15.54 kg/m  SpO2: SpO2: 100 % O2 Device: O2 Device: Room Air O2 Flow Rate:    Intake/output summary:   Intake/Output Summary (Last 24 hours) at 10/03/2018 1131  Last data filed at 10/03/2018 0900 Gross per 24 hour  Intake 1458.83 ml  Output 1803 ml  Net -344.17 ml   LBM: Last BM Date: 09/29/18 Baseline Weight: Weight: 60.8 kg Most recent weight: Weight: 56.4 kg       Palliative Assessment/Data: PPS: 20%    Flowsheet Rows     Most Recent Value  Intake Tab  Referral Department  Hospitalist  Unit at Time of Referral  Med/Surg Unit  Palliative Care Primary Diagnosis  Other (Comment) [CKD]  Date Notified  10/02/18  Palliative Care Type  New Palliative care  Reason for referral  Clarify Goals of Care  Date of Admission  09/30/18  # of days IP prior to Palliative referral  2  Clinical Assessment  Psychosocial & Spiritual Assessment  Palliative Care Outcomes      Patient Active Problem List   Diagnosis Date Noted  . Dementia with behavioral disturbance ([place])   . Palliative care by specialist   . Advanced care planning/counseling discussion   . Goals of care, counseling/discussion   . Pressure injury of skin 10/01/2018  . ARF (acute renal failure) ([place]) 09/30/2018  . Bone lesion 03/27/2018  . AAA (abdominal aortic aneurysm) ([place]) 03/27/2018  . BPH (benign prostatic hyperplasia) 03/27/2018  . Weight loss 03/27/2018  . Hypotension 03/27/2018  . Unspecified atrial fibrillation ([place]) 03/27/2018  . Protein-calorie malnutrition, severe 03/27/2018  . SIRS (systemic inflammatory response syndrome) ([place]) 03/26/2018    Palliative Care Assessment & Plan   Patient Profile: 85 y.o. male  with past medical history of CKD s/p transplant x 2, COPD, asbestos exposure, A-fib, chronic anemia admitted on 09/30/2018 with weakness, fever. Workup reveals acute on  chronic kidney injury, dehydration, UTI, anemia. Bladder ultra sound + wall thickening with recommendations for cystoscopy. Fecal occult +. Palliative medicine consulted for "GOC and code status".    Assessment/Recommendations/Plan   DNR  No invasive procedures  Treat what is treatable  Family considering SNF for rehab with transition to Hospice at home if he fails to improve vs discharging home with Hospice  Goals of Care and Additional Recommendations:  Limitations on Scope of Treatment: No Surgical Procedures  Code Status:  DNR  Prognosis:   < 6 months d/t significant changes in nutrition (>10% unintentional weight loss), cognition and function in the last six months, Albumin 1.8; bladder ultrasound worrisome for transitional cell cancer- no further workup or treatment being pursued   Discharge Planning:  To Be Determined  Care plan was discussed with patient's children- [place] and [place].  Thank you for allowing the Palliative Medicine Team to assist in the care of this patient.   Time In: 1100 Time Out: 1135 Total Time 35 minutes Prolonged Time Billed no      Greater than 50%  of this time was spent counseling and coordinating care related to the above assessment and plan.  Mariana Kaufman, AGNP-C Palliative Medicine   Please contact Palliative Medicine Team phone at [phone] for questions and concerns.

## 2018-10-03 NOTE — Progress Notes (Signed)
PROGRESS NOTE    [name] [name]  KCL:[id number] DOB: 12/19/1927 DOA: 09/30/2018 PCP: Mauricia Area, MD   Brief Narrative: 85 year old male with history of kidney transplant several decades ago, CKD stage III, PAF, hypertension, HLD, anemia of chronic disease, chronic weakness and deconditioning with moderate to severe protein calorie malnutrition who lives at home with daughter, is minimally active, using cane who has been feeling poorly with sore throat chills not eating and drinking well, was brought to the ER for evaluation point of fever of 102.  In the ER chest x-ray, UA and influenza screen were unremarkable noted to have AKI and was admitted for further management  10/03/2018: Patient seen alongside patient's son and daughter.  Palliative care NPH is awaited.  Consulted urology team.  Apparently, patient is known to Dr. Jeffie Pollock with [facility].  According to the patient's daughter, Dr. Jeffie Pollock performed prostatectomy on patient about 2 years ago for enlarged prostate.  Hemoglobin is 10.2 g/dL today.  No new complaints from patient.  Subjective: Patient was seen alongside patient's son and daughter.   Patient is said to be stronger today.   Patient is a poor historian.      Assessment & Plan:   AKI on CKD III:  Baseline creatinine close to 1.8. Creat  On admit 2.5->2.2, improving w ivf we will continue on IV RL. Suspect multifactorial from prerenal with decreased oral intake, possible some component of obstruction due to BPH, bladder scan showed 607 urine and needed in and out catheterization.  Order renal ultrasound.cont ivf. monior I/o, bladder scan. 10/02/2018: Serum creatinine is down to 2.01 today.  Continue to monitor.  Continue treatment for possible UTI. 10/03/2018: AKI is slowly resolving.  Serum creatinine is 1.94 today.  Fever at home with SIRS, UA showed WBC more than 50 large leukocyte esterase, and also having retention suspecting UTI, given his  immunosuppressed status.   Cont on empiric ceftriaxone, follow-up on urine culture( RN informed to add on). Blood cx In process.  BP is stable. Chest x-ray showed poorly defined rounded nodular density in the left lung base nodule vs rounded focus of pneumonia or atelectasis- ct chest recommended, will order to further characterize.  Prior CT scan showed changes of asbestosis and calcification. 10/02/2018: Urinalysis is suggestive of likely UTI.  Continue antibiotics. 10/03/2018: No documented fever overnight.  Urine culture is growing less than 10,000 colonies, considered to be insignificant.  Status post renal transplant several decades ago, chronically immunosuppressed on prednisone, mycophenolate and cyclosporine.Known to Dr. Jimmy Footman from nephrology, 10/02/2018 AKI of transplanted kidney is improving.  Continue to monitor.  BPH, he has had history of cystoscopy.I will start on Flomax.  Bladder scan if he has ongoing urine retention will need to consider urology consultation/Foley catheter.He has had Foley placed in the past. Obtaininig US renal.  If he has further retention may need to place catheter. 10/03/2018: Patient's daughter gives history of prostatectomy.  Consulted urology team.  N.p.o. will be highly appreciated.  Foley catheter for now.  Stage II pressure ulcer sacrum, POA-cont off loading, wound care.  Protein-calorie malnutrition, severe: Cont to nutrition.  Consulted dietitian. Poor oral intake/deconditioning/failure to thrive.  Patient has become forgetful for family.  She was frustrated, not eating well.  Consider low dose Remeron bedtime  Anemia of chronic disease/Acute blood loss anemia: with drop in hemoglobin from 9.7 g on admission (likely hemoconcentrated), this morning 7.2 to 7.6 g, ordered anemia panel.  Suspect from ivf and anemia of renal disease.  No  obvious blood loss noted, check Hemoccult in he stool.  Of note he is on Eliquis for A. Fib. 10/02/2018: Hemoglobin is 6.6 g/dL today.  Stool occult  blood is positive.  GI and cardiology team consulted.  As per cardiology note, Eliquis will be discontinued. 10/03/2018: Patient was transfused with 2 units of packed red blood cells.  Hemoglobin is 10.2 g/dL today.  Continue to monitor.  Eliquis has been discontinued.  PAF:in NSR, continue home Cardizem.  DVT prophylaxis:  Code Status: full Code Family Communication: Son Disposition Plan: This will depend on hospital course  consultants: Cardiology and GI.  Procedures: none  Antimicrobials: Anti-infectives (From admission, onward)   Start     Dose/Rate Route Frequency Ordered Stop   10/01/18 1800  cefTRIAXone (ROCEPHIN) 1 g in sodium chloride 0.9 % 100 mL IVPB     1 g 200 mL/hr over 30 Minutes Intravenous Every 24 hours 10/01/18 1149 10/04/18 1759   09/30/18 1800  cefTRIAXone (ROCEPHIN) 1 g in sodium chloride 0.9 % 100 mL IVPB     1 g 200 mL/hr over 30 Minutes Intravenous  Once 09/30/18 1746 09/30/18 1847   09/30/18 1730  oseltamivir (TAMIFLU) capsule 75 mg     75 mg Oral  Once 09/30/18 1727 09/30/18 1819       Objective: Vitals:   10/02/18 1649 10/02/18 2002 10/03/18 0638 10/03/18 0940  BP: 112/79 133/90 125/90 125/88  Pulse: 64 63 (!) 53 (!) 59  Resp: 16 18 19 18   Temp: (!) 97.5 F (36.4 C) (!) 97.5 F (36.4 C)    TempSrc: Oral Oral    SpO2: 100% 100%  100%  Weight:  56.4 kg    Height:        Intake/Output Summary (Last 24 hours) at 10/03/2018 1703 Last data filed at 10/03/2018 1404 Gross per 24 hour  Intake 600 ml  Output 2000 ml  Net -1400 ml   Filed Weights   10/01/18 0404 10/01/18 2101 10/02/18 2002  Weight: 56.6 kg 56.5 kg 56.4 kg   Weight change: -0.1 kg  Body mass index is 15.54 kg/m.  Intake/Output from previous day: 01/22 0701 - 01/23 0700 In: 1278.8 [P.O.:560; Blood:403.8] Out: 1403 [Urine:1403] Intake/Output this shift: Total I/O In: 600 [P.O.:600] Out: 900 [Urine:900]  Examination:  General exam:Appears calm and comfortable.  HEENT:  Pallor.  No jaundice Respiratory system: Clear to auscultation.   Cardiovascular system: S1 & S2 heard. Gastrointestinal system: Abdomen is  soft, non tender, non distended, BS +  Nervous System: Awake and alert.  Patient moves all limbs.   Extremities:No edema,   Medications: Scheduled Meds: . sodium chloride   Intravenous Once  . aspirin  81 mg Oral Daily  . cycloSPORINE modified  125 mg Oral BID  . diltiazem  180 mg Oral Daily  . famotidine  20 mg Oral QHS  . feeding supplement (ENSURE ENLIVE)  237 mL Oral TID BM  . feeding supplement (PRO-STAT SUGAR FREE 64)  30 mL Oral BID BM  . mycophenolate  180 mg Oral BID  . OLANZapine zydis  5 mg Oral QHS  . predniSONE  5 mg Oral Q breakfast  . tamsulosin  0.4 mg Oral QPC supper   Continuous Infusions: . cefTRIAXone (ROCEPHIN)  IV 1 g (10/02/18 2022)    Data Reviewed: I have personally reviewed following labs and imaging studies  CBC: Recent Labs  Lab 09/30/18 1500 10/01/18 0505 10/01/18 0950 10/02/18 0412 10/03/18 0417  WBC 5.7 4.8 5.9 4.7  6.6  NEUTROABS 4.2  --   --   --  5.3  HGB 9.7* 7.2* 7.6* 6.6* 10.2*  HCT 32.0* 23.1* 24.6* 22.1* 31.3*  MCV 102.2* 100.0 100.4* 100.0 95.1  PLT 219 181 200 176 657   Basic Metabolic Panel: Recent Labs  Lab 09/30/18 1500 09/30/18 1508 10/01/18 0505 10/02/18 0412 10/03/18 0417  NA 139  --  138 139 137  K 4.9  --  4.5 4.7 4.6  CL 103  --  106 104 103  CO2 25  --  23 25 26   GLUCOSE 97  --  101* 118* 90  BUN 53*  --  51* 57* 57*  CREATININE 2.49*  --  2.21* 2.01* 1.94*  CALCIUM 9.1  --  8.6* 8.7* 8.5*  MG  --  1.9 1.7  --  1.6*  PHOS  --  3.0  --   --  2.6   GFR: Estimated Creatinine Clearance: 21 mL/min (A) (by C-G formula based on SCr of 1.94 mg/dL (H)). Liver Function Tests: Recent Labs  Lab 09/30/18 1500 10/03/18 0417  AST 24  --   ALT 14  --   ALKPHOS 76  --   BILITOT 1.1  --   PROT 6.9  --   ALBUMIN 2.0* 1.8*   No results for input(s): LIPASE, AMYLASE in the  last 168 hours. No results for input(s): AMMONIA in the last 168 hours. Coagulation Profile: Recent Labs  Lab 09/30/18 1500  INR 1.22   Cardiac Enzymes: No results for input(s): CKTOTAL, CKMB, CKMBINDEX, TROPONINI in the last 168 hours. BNP (last 3 results) No results for input(s): PROBNP in the last 8760 hours. HbA1C: No results for input(s): HGBA1C in the last 72 hours. CBG: No results for input(s): GLUCAP in the last 168 hours. Lipid Profile: No results for input(s): CHOL, HDL, LDLCALC, TRIG, CHOLHDL, LDLDIRECT in the last 72 hours. Thyroid Function Tests: Recent Labs    09/30/18 1920  TSH 3.369   Anemia Panel: Recent Labs    10/01/18 0950  VITAMINB12 1,137*  FOLATE 16.4  FERRITIN 335  TIBC 136*  IRON 15*  RETICCTPCT 3.7*   Sepsis Labs: Recent Labs  Lab 09/30/18 1650  LATICACIDVEN 2.75*    Recent Results (from the past 240 hour(s))  Culture, blood (routine x 2)     Status: None (Preliminary result)   Collection Time: 09/30/18  2:58 PM  Result Value Ref Range Status   Specimen Description BLOOD RIGHT ANTECUBITAL  Final   Special Requests   Final    BOTTLES DRAWN AEROBIC AND ANAEROBIC Blood Culture adequate volume   Culture   Final    NO GROWTH 3 DAYS Performed at [facility], [place] [address], [place], [place] [address]    Report Status PENDING  Incomplete  Culture, blood (routine x 2)     Status: None (Preliminary result)   Collection Time: 09/30/18  3:02 PM  Result Value Ref Range Status   Specimen Description BLOOD LEFT FOREARM  Final   Special Requests   Final    BOTTLES DRAWN AEROBIC ONLY Blood Culture results may not be optimal due to an inadequate volume of blood received in culture bottles   Culture   Final    NO GROWTH 3 DAYS Performed at [facility], [place] [address] [place], [place] [address]    Report Status PENDING  Incomplete  Culture, Urine     Status: Abnormal   Collection Time: 10/01/18 10:33 PM  Result Value Ref Range  Status   Specimen Description URINE, CLEAN CATCH  Final   Special Requests NONE  Final   Culture (A)  Final    <10,000 COLONIES/mL INSIGNIFICANT GROWTH Performed at [facility], [address] [place], [place] [address]    Report Status 10/03/2018 FINAL  Final      Radiology Studies: Ct Chest Wo Contrast  Result Date: 10/01/2018 CLINICAL DATA:  Evaluate for lung nodule. EXAM: CT CHEST WITHOUT CONTRAST TECHNIQUE: Multidetector CT imaging of the chest was performed following the standard protocol without IV contrast. COMPARISON:  CT chest 07/22/2018 FINDINGS: Cardiovascular: Normal heart size. No pericardial effusion. Aortic atherosclerosis. Aneurysmal dilatation of the ascending thoracic aorta measures up to 4.3 cm. Multi vessel coronary artery atherosclerotic calcifications including left main disease noted. Mediastinum/Nodes: Normal appearance of the thyroid gland. The trachea appears patent and is midline. Small hiatal hernia suspected. No mediastinal adenopathy. No enlarged axillary lymph nodes. Lungs/Pleura: There are small bilateral pleural effusions which appear new from previous exam. Bilateral pleural calcifications are identified compatible with prior asbestos exposure. Advanced changes of emphysema noted. Patchy peribronchovascular predominant areas of mild ground-glass attenuation, most evident throughout the mid to upper lungs bilaterally, similar compared to the prior study. Scattered calcified granulomas are noted in the lungs bilaterally. Emphysematous bulla within the posterior left lower lobe has a new nodular filling defect measuring 1.4 cm, image 77/8 and corresponds to the nodular density identified on recent chest radiograph. Upper Abdomen: No acute abnormality. Musculoskeletal: No chest wall mass or suspicious bone lesions identified. The patient appears diffusely cachectic. IMPRESSION: 1. Asbestos related pleural disease redemonstrated. New small bilateral pleural  effusions are identified. 2. Diffuse bronchial wall thickening with emphysema, as above; imaging findings suggestive of underlying COPD. Nonspecific mild peribronchovascular predominant ground-glass attenuation within the mid and upper lung zones bilaterally, stable compared to previous exam. 3. New nodular density filling a posterior left lower lobe emphysematous bulla is likely postinflammatory or infectious in etiology. Recommend followup imaging in 3 months with repeat CT of the chest to ensure resolution. 4. Aortic atherosclerosis as well as left main and 3 vessel coronary artery disease. 5. Aortic Atherosclerosis (ICD10-I70.0) and Emphysema (ICD10-J43.9). Electronically Signed   By: Kerby Moors M.D.   On: 10/01/2018 19:37   US Renal  Result Date: 10/01/2018 CLINICAL DATA:  Urinary retention EXAM: ULTRASOUND OF RENAL TRANSPLANT TECHNIQUE: Ultrasound examination of the renal transplant was performed with gray-scale and color Doppler evaluation. COMPARISON:  None. FINDINGS: Transplant kidney location:  LLQ Transplant kidney description: Renal measurements: 9.3 x 5.9 x 4.2 cm = volume: 124.27mL. Normal cortical echogenicity. No evidence of mass. Mild hydronephrosis is present. Color flow in the main renal artery at the hilum:  Yes Color flow in the main renal vein at the hilum:  Yes Bladder: There is focal wall thickening along the posterior wall of the bladder measuring up to 1.5 cm in thickness. There is a moderate amount of fluid in the bladder. Other findings:  No perinephric fluid collections. IMPRESSION: There is a moderate amount of fluid in the bladder and mild hydronephrosis of the left lower quadrant transplant kidney. Focal wall thickening along the posterior wall of the bladder is noted. Transitional cell carcinoma is not excluded. Consider cystoscopy. Electronically Signed   By: Marybelle Killings M.D.   On: 10/01/2018 18:28      LOS: 2 days   Time spent: 35 minutes  Bonnell Public,  MD [facility]  10/03/2018, 5:03  PM

## 2018-10-04 LAB — BASIC METABOLIC PANEL
ANION GAP: 11 (ref 5–15)
BUN: 55 mg/dL — ABNORMAL HIGH (ref 8–23)
CO2: 26 mmol/L (ref 22–32)
Calcium: 8.6 mg/dL — ABNORMAL LOW (ref 8.9–10.3)
Chloride: 99 mmol/L (ref 98–111)
Creatinine, Ser: 2.01 mg/dL — ABNORMAL HIGH (ref 0.61–1.24)
GFR calc Af Amer: 33 mL/min — ABNORMAL LOW (ref >60)
GFR calc non Af Amer: 29 mL/min — ABNORMAL LOW (ref >60)
Glucose, Bld: 100 mg/dL — ABNORMAL HIGH (ref 70–99)
Potassium: 4.4 mmol/L (ref 3.5–5.1)
Sodium: 136 mmol/L (ref 135–145)

## 2018-10-04 MED ORDER — CEPHALEXIN 250 MG PO CAPS
250.0000 mg | ORAL_CAPSULE | Freq: Three times a day (TID) | ORAL | Status: DC
  Administered 2018-10-04 – 2018-10-05 (×5): 250 mg via ORAL
  Filled 2018-10-04 (×6): qty 1

## 2018-10-04 NOTE — Clinical Social Work Note (Signed)
Verbal consult received for SNF placement. CSW and nurse case manager Abigail Butts visited room and talked with patient and his son Markeis regarding discharge disposition. Per son, patient will discharge home and hevasked that patient not d/c until Sunday as he has to get some things in place (he lives out of town) before he can return on Sunday and be here to help care for his dad, as he is retired. Nurse case manager continued conversation with patient regarding [facility]. [place] signing off as patient will discharge home with [facility].   Larhonda Dettloff Givens, MSW, LCSW Licensed Clinical Social Worker Clinical Social Work [place] [phone]

## 2018-10-04 NOTE — Progress Notes (Addendum)
Physical Therapy Treatment Patient Details Name: [name] [name] MRN: [id number] DOB: [birth year] Today's Date: 10/04/2018    History of Present Illness Pt is an 85 y/o male with a PMH significant for kidney transplant (R in 1980, L in 1994 with R transplanted kidney removal), TIA, ESRD. Pt presents with a "few day" history of feeling poorly, sore throat, fever and chills.     PT Comments    Pt received in bed, reports he's been very active today and is a bit tired, however RN reports pt has not mobilized out of room this date. Pt is agreeable to PT session. AMB progressed from 100 to 188 ft this date, but patient moving slowly and requires physical assist for safety and stability, particularly with turns. Pt performs 5x STS from EOB. RW height adjustment improves capacity for UE stabilization ad lib. Pt progressing well overall, but clearly fatigued at end of session.     Follow Up Recommendations  Home health PT;Supervision/Assistance - 24 hour;Supervision for mobility/OOB     Equipment Recommendations  Rolling walker with 5" wheels    Recommendations for Other Services       Precautions / Restrictions Precautions Precautions: Fall Precaution Comments: Pt technically a "mod fall risk", however feel he is at a high risk for falls Restrictions Weight Bearing Restrictions: No    Mobility  Bed Mobility Overal bed mobility: Needs Assistance Bed Mobility: Supine to Sit;Sit to Supine     Supine to sit: Supervision Sit to supine: Supervision   General bed mobility comments: Needs assist for safety 2/2 catheter placement.   Transfers Overall transfer level: Needs assistance Equipment used: Rolling walker (2 wheeled) Transfers: Sit to/from Stand Sit to Stand: Supervision;From elevated surface         General transfer comment: performed 5x from EOB.   Ambulation/Gait Ambulation/Gait assistance: Min guard Gait Distance (Feet): 188 Feet Assistive device: Rolling walker (2  wheeled) Gait Pattern/deviations: Decreased stride length;Step-to pattern Gait velocity: 0.30m/s    General Gait Details: unsafe RW distance 3x, typcially when turning, with 1 associated instance of instability.    Stairs             Wheelchair Mobility    Modified Rankin (Stroke Patients Only)       Balance                                            Cognition Arousal/Alertness: Awake/alert Behavior During Therapy: WFL for tasks assessed/performed Overall Cognitive Status: Within Functional Limits for tasks assessed                                        Exercises      General Comments        Pertinent Vitals/Pain Pain Assessment: No/denies pain    Home Living                      Prior Function            PT Goals (current goals can now be found in the care plan section) Acute Rehab PT Goals Patient Stated Goal: Pt prefers to return home at d/c vs going to SNF.  PT Goal Formulation: With patient/family Time For Goal Achievement: 10/08/18 Potential to Achieve Goals: Good Progress towards PT  goals: Progressing toward goals    Frequency    Min 3X/week      PT Plan Current plan remains appropriate    Co-evaluation              AM-PAC PT "6 Clicks" Mobility   Outcome Measure  Help needed turning from your back to your side while in a flat bed without using bedrails?: None Help needed moving from lying on your back to sitting on the side of a flat bed without using bedrails?: None Help needed moving to and from a bed to a chair (including a wheelchair)?: A Little Help needed standing up from a chair using your arms (e.g., wheelchair or bedside chair)?: A Little Help needed to walk in hospital room?: A Little Help needed climbing 3-5 steps with a railing? : A Little 6 Click Score: 20    End of Session Equipment Utilized During Treatment: Gait belt Activity Tolerance: Patient tolerated  treatment well;Patient limited by fatigue Patient left: in bed;with call bell/phone within reach;with bed alarm set Nurse Communication: Mobility status PT Visit Diagnosis: Unsteadiness on feet (R26.81);Muscle weakness (generalized) (M62.81);Difficulty in walking, not elsewhere classified (R26.2)     Time: 1103-1594 PT Time Calculation (min) (ACUTE ONLY): 17 min  Charges:  $Therapeutic Exercise: 8-22 mins                     5:18 PM, 10/04/18 Etta Grandchild, PT, DPT Physical Therapist - [place] [phone] (Pager)  [phone] (Office)       Buccola,[name] 10/04/2018, 5:15 PM

## 2018-10-04 NOTE — Care Management Note (Addendum)
Case Management Note Manya Silvas, RN MSN CCM Transitions of Care [address] [place] [phone]  Patient Details  Name: [name] [name] MRN: [id number] Date of Birth: [birth year]  Subjective/Objective:          Acute renal failure        Action/Plan: PTA home with daughter. History of renal transplant. Spoke with PT about today's session who advised that pt may be a candidate for [facility] vs SNF. Referral made to [facility] [place]. Will continue to follow fro transition of care needs.   Expected Discharge Date:                  Expected Discharge Plan:  [place]  In-House Referral:  Clinical Social Work  Discharge planning Services  CM Consult  Post Acute Care Choice:  Hospice(palliative care ) Choice offered to:  Patient, Adult Children  DME Arranged:  N/A DME Agency:  NA  HH Arranged:  PT, Nurse's Aide, OT HH Agency:  [facility]  Status of Service:  In process, will continue to follow  If discussed at Long Length of Stay Meetings, dates discussed:    Additional Comments: 10/04/2018-spoke with with pt son and daughter at patient bedside. Discussed anticipated transition home tomorrow 10/05/2018 with [facility] to send a nurse to the home on Sunday 10/06/2018. Patient will need [place] orders for RN, PT, OT with Face to Face prior to transition home.  Choice offered for palliative services-Hospice & Palliative Care of  chosen-referral made. No other transition of care needs identified at this time.   10/04/2018-spoke with patient and son, [name] [name], at bedside. Family would like to bring patient home. Discussed referral to [facility] interested in this option. [name] at [facility] to visit bedside to discuss this program. Offered choice for palliative services-son to let CM know decision. Will continue to follow for transition of care needs.   Bartholomew Crews, RN 10/04/2018, 2:34 PM

## 2018-10-04 NOTE — NC FL2 (Signed)
[place] LEVEL OF CARE SCREENING TOOL     IDENTIFICATION  Patient Name: [name] [name] Birthdate: 08-04-1928 Sex: male Admission Date (Current Location): 09/30/2018  [facility] and [place] Number:  Herbalist and Address:  [facility], [place] [address], [place], [place] [address]      Provider Number: [id number]  Attending Physician Name and Address:  Bonnell Public, MD  Relative Name and Phone Number:  Carol Loftin, Daughter, [place], Son, [phone]    Current Level of Care: Hospital Recommended Level of Care: [place] Prior Approval Number:    Date Approved/Denied: 10/04/18 PASRR Number: [id number]  Discharge Plan: SNF    Current Diagnoses: Patient Active Problem List   Diagnosis Date Noted  . Hypoalbuminemia   . Dementia with behavioral disturbance ([place])   . Palliative care by specialist   . Advanced care planning/counseling discussion   . Goals of care, counseling/discussion   . Pressure injury of skin 10/01/2018  . ARF (acute renal failure) ([place]) 09/30/2018  . Bone lesion 03/27/2018  . AAA (abdominal aortic aneurysm) ([place]) 03/27/2018  . BPH (benign prostatic hyperplasia) 03/27/2018  . Weight loss 03/27/2018  . Hypotension 03/27/2018  . Unspecified atrial fibrillation ([place]) 03/27/2018  . Protein-calorie malnutrition, severe 03/27/2018  . SIRS (systemic inflammatory response syndrome) (H[facility]) 03/26/2018    Orientation RESPIRATION BLADDER Height & Weight     Self, Place  Normal Continent(Has uretheral catheter) Weight: 134 lb 7.7 oz (61 kg) Height:  6\' 3"  (190.5 cm)  BEHAVIORAL SYMPTOMS/MOOD NEUROLOGICAL BOWEL NUTRITION STATUS      Incontinent Diet(Soft foods, thin liquids)  AMBULATORY STATUS COMMUNICATION OF NEEDS Skin   Independent   Normal, Other (Comment)(Has pressure ulcer being treated with foam)                       Personal Care Assistance Level of  Assistance  Bathing, Feeding, Dressing, Total care Bathing Assistance: Limited assistance Feeding assistance: Limited assistance Dressing Assistance: Limited assistance Total Care Assistance: Limited assistance   Functional Limitations Info  Sight, Hearing, Speech Sight Info: Adequate Hearing Info: Adequate Speech Info: Adequate    SPECIAL CARE FACTORS FREQUENCY  PT (By licensed PT), OT (By licensed OT)     PT Frequency: 5x/wk OT Frequency: 5x/wk            Contractures Contractures Info: Not present    Additional Factors Info  Allergies, Code Status Code Status Info: DNR Allergies Info: Ciprofloxacin, Clindamycin/lincomycin, Levofloxacin, Septra Sulfamethoxazole-trimethoprim           Current Medications (10/04/2018):  This is the current hospital active medication list Current Facility-Administered Medications  Medication Dose Route Frequency Provider Last Rate Last Dose  . 0.9 %  sodium chloride infusion (Manually program via Guardrails IV Fluids)   Intravenous Once Schorr, Rhetta Mura, NP      . acetaminophen (TYLENOL) tablet 650 mg  650 mg Oral Q6H PRN Thurnell Lose, MD   650 mg at 10/02/18 1448  . aspirin chewable tablet 81 mg  81 mg Oral Daily Thurnell Lose, MD   81 mg at 10/04/18 7322  . cephALEXin (KEFLEX) capsule 250 mg  250 mg Oral Q8H Dana Allan I, MD      . cycloSPORINE modified (NEORAL) capsule 125 mg  125 mg Oral BID Thurnell Lose, MD   125 mg at 10/04/18 0254  . diltiazem (CARDIZEM CD) 24 hr capsule 180 mg  180 mg Oral Daily Thurnell Lose, MD   180 mg at 10/04/18 9842  . docusate sodium (COLACE) capsule 200 mg  200 mg Oral Daily PRN Thurnell Lose, MD   200 mg at 10/04/18 1031  . famotidine (PEPCID) tablet 20 mg  20 mg Oral QHS Thurnell Lose, MD   20 mg at 10/03/18 2238  . feeding supplement (ENSURE ENLIVE) (ENSURE ENLIVE) liquid 237 mL  237 mL Oral TID BM Dana Allan I, MD   237 mL at 10/04/18 0834  . feeding supplement  (PRO-STAT SUGAR FREE 64) liquid 30 mL  30 mL Oral BID BM Dana Allan I, MD   30 mL at 10/04/18 0832  . ipratropium-albuterol (DUONEB) 0.5-2.5 (3) MG/3ML nebulizer solution 3 mL  3 mL Nebulization Q6H PRN Kc, Ramesh, MD      . mycophenolate (MYFORTIC) EC tablet 180 mg  180 mg Oral BID Thurnell Lose, MD   180 mg at 10/04/18 2811  . OLANZapine zydis (ZYPREXA) disintegrating tablet 5 mg  5 mg Oral QHS Earlie Counts, NP   5 mg at 10/03/18 2239  . ondansetron (ZOFRAN) injection 4 mg  4 mg Intravenous Q6H PRN Thurnell Lose, MD      . predniSONE (DELTASONE) tablet 5 mg  5 mg Oral Q breakfast Thurnell Lose, MD   5 mg at 10/04/18 [phone]  . tamsulosin (FLOMAX) capsule 0.4 mg  0.4 mg Oral QPC supper Antonieta Pert, MD   0.4 mg at 10/03/18 1755     Discharge Medications: Please see discharge summary for a list of discharge medications.  Relevant Imaging Results:  Relevant Lab Results:   Additional Information SSN: [id number]  Philippa Chester Lynnett Langlinais, LCSWA

## 2018-10-04 NOTE — Progress Notes (Signed)
Palliative note:   Patient in bed, awakens easily, tells me he "feels so much better". Patient's son is at bedside. He notes that MD told him patient was being discharged today. He is awaiting the arrival of his sister to discuss their preference regarding disposition- SNF vs Home with Hospice.   Encouraged to call me when they have discussed or if have questions. Let them know they could also discuss plans with attending MD, social work or Therapist, sports.   Mariana Kaufman, AGNP-C Palliative Medicine  Please call Palliative Medicine team phone with any questions [phone]. For individual providers please see AMION.  Time in: 1045  Time out: 1100 Total time: 15 mins

## 2018-10-04 NOTE — Care Management Note (Signed)
Case Management Note [name] Silvas, RN MSN CCM Transitions of Care [facility] [place] [phone]  Patient Details  Name: [name] [name] MRN: [id number] Date of Birth: [birth year]  Subjective/Objective:          Acute renal failure        Action/Plan: PTA home with daughter. History of renal transplant. Spoke with PT about today's session who advised that pt may be a candidate for [facility] program vs SNF. Referral made to [facility] at [place]. Will continue to follow fro transition of care needs.   Expected Discharge Date:                  Expected Discharge Plan:  [place]  In-House Referral:  Clinical Social Work  Discharge planning Services  CM Consult  Post Acute Care Choice:  Hospice(palliative care ) Choice offered to:  Patient, Adult Children  DME Arranged:  N/A DME Agency:  NA  HH Arranged:  PT, Nurse's Aide, OT HH Agency:  [facility]  Status of Service:  In process, will continue to follow  If discussed at Long Length of Stay Meetings, dates discussed:    Additional Comments: 10/04/2018-spoke with patient and son, [name] [name], at bedside. Family would like to bring patient home. Discussed referral to [employer] interested in this option. [name] at [facility] to visit bedside to discuss this program. Offered choice for palliative services-son to let CM know decision. Will continue to follow for transition of care needs.   [name] Crews, RN 10/04/2018, 11:40 AM

## 2018-10-04 NOTE — Progress Notes (Signed)
PROGRESS NOTE    [name] [name]  YPP:[id number] DOB: 05/30/1928 DOA: 09/30/2018 PCP: Mauricia Area, MD   Brief Narrative: 85 year old male with history of kidney transplant several decades ago, CKD stage III, PAF, hypertension, HLD, anemia of chronic disease, chronic weakness and deconditioning with moderate to severe protein calorie malnutrition who lives at home with daughter, is minimally active, using cane who has been feeling poorly with sore throat chills not eating and drinking well, was brought to the ER for evaluation point of fever of 102.  In the ER chest x-ray, UA and influenza screen were unremarkable noted to have AKI and was admitted for further management  10/03/2018: Patient seen alongside patient's son and daughter.  Palliative care NPH is awaited.  Consulted urology team.  Apparently, patient is known to Dr. Jeffie Pollock with [facility].  According to the patient's daughter, Dr. Jeffie Pollock performed prostatectomy on patient about 2 years ago for enlarged prostate.  Hemoglobin is 10.2 g/dL today.  No new complaints from patient.  10/04/2018: Patient seen alongside patient's son.  Patient remains stable.  Urology input is highly appreciated.  Discussed with the case manager as well, plan is to discharge patient home with home health PT/nursing/Nursing aide, as well as, palliative care input.  Hopefully, patient be discharged tomorrow.  Subjective: Patient was seen alongside patient's son.  No significant history from patient.  No new changes.   Assessment & Plan:   AKI on CKD III:  Baseline creatinine close to 1.8. Creat  On admit 2.5->2.2, improving w ivf we will continue on IV RL. Suspect multifactorial from prerenal with decreased oral intake, possible some component of obstruction due to BPH, bladder scan showed 607 urine and needed in and out catheterization.  Order renal ultrasound.cont ivf. monior I/o, bladder scan. 10/02/2018: Serum creatinine is down to 2.01 today.  Continue  to monitor.  Continue treatment for possible UTI. 10/03/2018: AKI is slowly resolving.  Serum creatinine is 1.94 today. 10/04/2018: Renal function is fairly stable.  Proceed disposition in a.m.  Fever at home with SIRS, UA showed WBC more than 50 large leukocyte esterase, and also having retention suspecting UTI, given his  immunosuppressed status.  Cont on empiric ceftriaxone, follow-up on urine culture( RN informed to add on). Blood cx In process.  BP is stable. Chest x-ray showed poorly defined rounded nodular density in the left lung base nodule vs rounded focus of pneumonia or atelectasis- ct chest recommended, will order to further characterize.  Prior CT scan showed changes of asbestosis and calcification. 10/02/2018: Urinalysis is suggestive of likely UTI.  Continue antibiotics. 10/03/2018: No documented fever overnight.  Urine culture is growing less than 10,000 colonies, considered to be insignificant. 10/04/2018: IV Rocephin discontinued.  Start patient on Keflex.  Complete 5-day course of Keflex.  Status post renal transplant several decades ago, chronically immunosuppressed on prednisone, mycophenolate and cyclosporine.Known to Dr. Jimmy Footman from nephrology, 10/02/2018 AKI of transplanted kidney is improving.  Continue to monitor.  BPH, he has had history of cystoscopy.I will start on Flomax.  Bladder scan if he has ongoing urine retention will need to consider urology consultation/Foley catheter.He has had Foley placed in the past. Obtaininig US renal.  If he has further retention may need to place catheter. 10/03/2018: Patient's daughter gives history of prostatectomy.  Consulted urology team.  N.p.o. will be highly appreciated.  Foley catheter for now. 10/04/2018: Urologist input is appreciated.  The patient home with Foley in situ.  Follow-up with urology on discharge.  Stage II  pressure ulcer sacrum, POA-cont off loading, wound care.  Protein-calorie malnutrition, severe: Cont to  nutrition.  Consulted dietitian. Poor oral intake/deconditioning/failure to thrive.  Patient has become forgetful for family.  She was frustrated, not eating well.  Consider low dose Remeron bedtime  Anemia of chronic disease/Acute blood loss anemia: with drop in hemoglobin from 9.7 g on admission (likely hemoconcentrated), this morning 7.2 to 7.6 g, ordered anemia panel.  Suspect from ivf and anemia of renal disease.  No obvious blood loss noted, check Hemoccult in he stool.  Of note he is on Eliquis for A. Fib. 10/02/2018: Hemoglobin is 6.6 g/dL today.  Stool occult blood is positive.  GI and cardiology team consulted.  As per cardiology note, Eliquis will be discontinued. 10/03/2018: Patient was transfused with 2 units of packed red blood cells.  Hemoglobin is 10.2 g/dL today.  Continue to monitor.  Eliquis has been discontinued.  PAF:in NSR, continue home Cardizem.  DVT prophylaxis:  Code Status: full Code Family Communication: Son Disposition Plan: This will depend on hospital course  consultants: Cardiology and GI.  Procedures: none  Antimicrobials: Anti-infectives (From admission, onward)   Start     Dose/Rate Route Frequency Ordered Stop   10/04/18 0900  cephALEXin (KEFLEX) capsule 250 mg     250 mg Oral Every 8 hours 10/04/18 0815 10/09/18 0559   10/01/18 1800  cefTRIAXone (ROCEPHIN) 1 g in sodium chloride 0.9 % 100 mL IVPB     1 g 200 mL/hr over 30 Minutes Intravenous Every 24 hours 10/01/18 1149 10/03/18 1826   09/30/18 1800  cefTRIAXone (ROCEPHIN) 1 g in sodium chloride 0.9 % 100 mL IVPB     1 g 200 mL/hr over 30 Minutes Intravenous  Once 09/30/18 1746 09/30/18 1847   09/30/18 1730  oseltamivir (TAMIFLU) capsule 75 mg     75 mg Oral  Once 09/30/18 1727 09/30/18 1819       Objective: Vitals:   10/03/18 1752 10/03/18 2124 10/04/18 0550 10/04/18 0921  BP: 135/87 136/90 126/85 122/88  Pulse: (!) 59 60 62 64  Resp: 18 18 19 18   Temp: (!) 97.2 F (36.2 C) (!) 97.3 F  (36.3 C) (!) 97.4 F (36.3 C)   TempSrc: Oral Oral Oral   SpO2: 100% 100% 99% 100%  Weight:  61 kg    Height:        Intake/Output Summary (Last 24 hours) at 10/04/2018 1504 Last data filed at 10/04/2018 1039 Gross per 24 hour  Intake 880 ml  Output 900 ml  Net -20 ml   Filed Weights   10/01/18 2101 10/02/18 2002 10/03/18 2124  Weight: 56.5 kg 56.4 kg 61 kg   Weight change: 4.6 kg  Body mass index is 16.81 kg/m.  Intake/Output from previous day: 01/23 0701 - 01/24 0700 In: 1180 [P.O.:1180] Out: 1800 [Urine:1800] Intake/Output this shift: Total I/O In: 300 [P.O.:300] Out: 0   Examination:  General exam:Appears calm and comfortable.  HEENT: Pallor.  No jaundice Respiratory system: Clear to auscultation.   Cardiovascular system: S1 & S2 heard. Gastrointestinal system: Abdomen is  soft, non tender, non distended, BS +  Nervous System: Awake and alert.  Patient moves all limbs.   Extremities:No edema,   Medications: Scheduled Meds: . sodium chloride   Intravenous Once  . aspirin  81 mg Oral Daily  . cephALEXin  250 mg Oral Q8H  . cycloSPORINE modified  125 mg Oral BID  . diltiazem  180 mg Oral Daily  .  famotidine  20 mg Oral QHS  . feeding supplement (ENSURE ENLIVE)  237 mL Oral TID BM  . feeding supplement (PRO-STAT SUGAR FREE 64)  30 mL Oral BID BM  . mycophenolate  180 mg Oral BID  . OLANZapine zydis  5 mg Oral QHS  . predniSONE  5 mg Oral Q breakfast  . tamsulosin  0.4 mg Oral QPC supper   Continuous Infusions:   Data Reviewed: I have personally reviewed following labs and imaging studies  CBC: Recent Labs  Lab 09/30/18 1500 10/01/18 0505 10/01/18 0950 10/02/18 0412 10/03/18 0417  WBC 5.7 4.8 5.9 4.7 6.6  NEUTROABS 4.2  --   --   --  5.3  HGB 9.7* 7.2* 7.6* 6.6* 10.2*  HCT 32.0* 23.1* 24.6* 22.1* 31.3*  MCV 102.2* 100.0 100.4* 100.0 95.1  PLT 219 181 200 176 295   Basic Metabolic Panel: Recent Labs  Lab 09/30/18 1500 09/30/18 1508  10/01/18 0505 10/02/18 0412 10/03/18 0417 10/04/18 0304  NA 139  --  138 139 137 136  K 4.9  --  4.5 4.7 4.6 4.4  CL 103  --  106 104 103 99  CO2 25  --  23 25 26 26   GLUCOSE 97  --  101* 118* 90 100*  BUN 53*  --  51* 57* 57* 55*  CREATININE 2.49*  --  2.21* 2.01* 1.94* 2.01*  CALCIUM 9.1  --  8.6* 8.7* 8.5* 8.6*  MG  --  1.9 1.7  --  1.6*  --   PHOS  --  3.0  --   --  2.6  --    GFR: Estimated Creatinine Clearance: 21.9 mL/min (A) (by C-G formula based on SCr of 2.01 mg/dL (H)). Liver Function Tests: Recent Labs  Lab 09/30/18 1500 10/03/18 0417  AST 24  --   ALT 14  --   ALKPHOS 76  --   BILITOT 1.1  --   PROT 6.9  --   ALBUMIN 2.0* 1.8*   No results for input(s): LIPASE, AMYLASE in the last 168 hours. No results for input(s): AMMONIA in the last 168 hours. Coagulation Profile: Recent Labs  Lab 09/30/18 1500  INR 1.22   Cardiac Enzymes: No results for input(s): CKTOTAL, CKMB, CKMBINDEX, TROPONINI in the last 168 hours. BNP (last 3 results) No results for input(s): PROBNP in the last 8760 hours. HbA1C: No results for input(s): HGBA1C in the last 72 hours. CBG: No results for input(s): GLUCAP in the last 168 hours. Lipid Profile: No results for input(s): CHOL, HDL, LDLCALC, TRIG, CHOLHDL, LDLDIRECT in the last 72 hours. Thyroid Function Tests: No results for input(s): TSH, T4TOTAL, FREET4, T3FREE, THYROIDAB in the last 72 hours. Anemia Panel: No results for input(s): VITAMINB12, FOLATE, FERRITIN, TIBC, IRON, RETICCTPCT in the last 72 hours. Sepsis Labs: Recent Labs  Lab 09/30/18 1650  LATICACIDVEN 2.75*    Recent Results (from the past 240 hour(s))  Culture, blood (routine x 2)     Status: None (Preliminary result)   Collection Time: 09/30/18  2:58 PM  Result Value Ref Range Status   Specimen Description BLOOD RIGHT ANTECUBITAL  Final   Special Requests   Final    BOTTLES DRAWN AEROBIC AND ANAEROBIC Blood Culture adequate volume   Culture   Final     NO GROWTH 4 DAYS Performed at [facility], [place] [address] [place], [place] [address]    Report Status PENDING  Incomplete  Culture, blood (routine x 2)  Status: None (Preliminary result)   Collection Time: 09/30/18  3:02 PM  Result Value Ref Range Status   Specimen Description BLOOD LEFT FOREARM  Final   Special Requests   Final    BOTTLES DRAWN AEROBIC ONLY Blood Culture results may not be optimal due to an inadequate volume of blood received in culture bottles   Culture   Final    NO GROWTH 4 DAYS Performed at [facility], [place] [address], [place], [place] [address]    Report Status PENDING  Incomplete  Culture, Urine     Status: Abnormal   Collection Time: 10/01/18 10:33 PM  Result Value Ref Range Status   Specimen Description URINE, CLEAN CATCH  Final   Special Requests NONE  Final   Culture (A)  Final    <10,000 COLONIES/mL INSIGNIFICANT GROWTH Performed at [facility], [address] [place], [place] [address]    Report Status 10/03/2018 FINAL  Final      Radiology Studies: No results found.    LOS: 3 days   Time spent: 35 minutes  Bonnell Public, MD [facility]  10/04/2018, 3:04 PM

## 2018-10-04 NOTE — Care Management Important Message (Signed)
Important Message  Patient Details  Name: [name] [name] MRN: [id number] Date of Birth: Jan 29, 1928   Medicare Important Message Given:  Yes    Retina [name] 10/04/2018, 11:21 AM

## 2018-10-05 DIAGNOSIS — R339 Retention of urine, unspecified: Secondary | ICD-10-CM

## 2018-10-05 DIAGNOSIS — N390 Urinary tract infection, site not specified: Secondary | ICD-10-CM

## 2018-10-05 DIAGNOSIS — R350 Frequency of micturition: Secondary | ICD-10-CM

## 2018-10-05 DIAGNOSIS — N401 Enlarged prostate with lower urinary tract symptoms: Secondary | ICD-10-CM

## 2018-10-05 LAB — CULTURE, BLOOD (ROUTINE X 2)
CULTURE: NO GROWTH
Culture: NO GROWTH
Special Requests: ADEQUATE

## 2018-10-05 LAB — MAGNESIUM: Magnesium: 1.8 mg/dL (ref 1.7–2.4)

## 2018-10-05 LAB — RENAL FUNCTION PANEL
Albumin: 1.8 g/dL — ABNORMAL LOW (ref 3.5–5.0)
Anion gap: 8 (ref 5–15)
BUN: 60 mg/dL — ABNORMAL HIGH (ref 8–23)
CO2: 27 mmol/L (ref 22–32)
Calcium: 8.7 mg/dL — ABNORMAL LOW (ref 8.9–10.3)
Chloride: 100 mmol/L (ref 98–111)
Creatinine, Ser: 2.15 mg/dL — ABNORMAL HIGH (ref 0.61–1.24)
GFR calc Af Amer: 31 mL/min — ABNORMAL LOW (ref >60)
GFR calc non Af Amer: 27 mL/min — ABNORMAL LOW (ref >60)
Glucose, Bld: 87 mg/dL (ref 70–99)
Phosphorus: 2 mg/dL — ABNORMAL LOW (ref 2.5–4.6)
Potassium: 4.8 mmol/L (ref 3.5–5.1)
Sodium: 135 mmol/L (ref 135–145)

## 2018-10-05 LAB — CBC WITH DIFFERENTIAL/PLATELET
Abs Immature Granulocytes: 0.02 10*3/uL (ref 0.00–0.07)
Basophils Absolute: 0 10*3/uL (ref 0.0–0.1)
Basophils Relative: 0 %
Eosinophils Absolute: 0 10*3/uL (ref 0.0–0.5)
Eosinophils Relative: 0 %
HCT: 33.1 % — ABNORMAL LOW (ref 39.0–52.0)
Hemoglobin: 10.3 g/dL — ABNORMAL LOW (ref 13.0–17.0)
Immature Granulocytes: 0 %
Lymphocytes Relative: 12 %
Lymphs Abs: 0.6 10*3/uL — ABNORMAL LOW (ref 0.7–4.0)
MCH: 30 pg (ref 26.0–34.0)
MCHC: 31.1 g/dL (ref 30.0–36.0)
MCV: 96.5 fL (ref 80.0–100.0)
Monocytes Absolute: 0.2 10*3/uL (ref 0.1–1.0)
Monocytes Relative: 4 %
Neutro Abs: 4.5 10*3/uL (ref 1.7–7.7)
Neutrophils Relative %: 84 %
Platelets: 174 10*3/uL (ref 150–400)
RBC: 3.43 MIL/uL — ABNORMAL LOW (ref 4.22–5.81)
RDW: 16 % — ABNORMAL HIGH (ref 11.5–15.5)
WBC: 5.5 10*3/uL (ref 4.0–10.5)
nRBC: 0 % (ref 0.0–0.2)

## 2018-10-05 MED ORDER — TAMSULOSIN HCL 0.4 MG PO CAPS: 0.4000 mg | ORAL_CAPSULE | Freq: Every day | ORAL | 0 refills | Status: AC

## 2018-10-05 MED ORDER — PRO-STAT SUGAR FREE PO LIQD: 30.0000 mL | Freq: Two times a day (BID) | ORAL | 0 refills | Status: AC

## 2018-10-05 MED ORDER — DOCUSATE SODIUM 100 MG PO CAPS: 200.0000 mg | ORAL_CAPSULE | Freq: Every day | ORAL | 0 refills | Status: AC | PRN

## 2018-10-05 MED ORDER — CEPHALEXIN 250 MG PO CAPS: 250.0000 mg | ORAL_CAPSULE | Freq: Three times a day (TID) | ORAL | 0 refills | Status: AC

## 2018-10-05 NOTE — Discharge Summary (Signed)
Physician Discharge Summary  Patient ID: Tasean Mancha MRN: [id number] DOB/AGE: 11-27-1927 85 y.o.  Admit date: 09/30/2018 Discharge date: 10/05/2018  Admission Diagnoses:  Discharge Diagnoses:  Principal Problem:   ARF (acute renal failure) ([place]) Active Problems:   SIRS (systemic inflammatory response syndrome) (H[facility])   BPH (benign prostatic hyperplasia)   Hypotension   Protein-calorie malnutrition, severe   Pressure injury of skin   Dementia with behavioral disturbance ([place])   Palliative care by specialist   Advanced care planning/counseling discussion   Goals of care, counseling/discussion   Hypoalbuminemia   Discharged Condition: fair  Hospital Course: Patient is an 85 year old male, with past medical history significant for transplantation several decades ago, CKD stage III, PAF, hypertension, HLD, anemia of chronic disease, chronic weakness and deconditioning with moderate to severe protein calorie malnutrition.  Patient lives at home with daughter, minimally active, using cane.  Patient had been feeling poorly, with sore throat, fever and chills, not eating or drinking well.  On presentation to the ER, patient was found to have temperature of 102 F.  Chest x-ray, UA and influenza screeninng done on presentation to the hospital were unremarkable.  Serum creatinine on presentation was 2.5, with known baseline of 1.8.  Bladder scan revealed 607 cc of urine.  Patient was admitted for further assessment and management.  Further work-up revealed hemoglobin of 7.2 g/dL.  Apparently, patient was on Eliquis for atrial fibrillation.  Cardiology and GI team were consulted to assist with patient's management.  Patient's family did not want any further GI work-up.  Cardiology team advised discontinuing Eliquis, especially, as patient was noted to be in normal sinus bradycardia and.  Patient was transfused with packed red blood cells.  Hemoglobin trended upwards to 10.2 g/dL following blood  transfusion.  Patient is known to the urology team.  Urology was consulted to assist with patient's care.  Urology team saw patient and advised discharging patient home with Foley catheter in situ.  Patient will follow with urology on discharge.  Patient was managed for possible UTI with IV Rocephin during the hospital stay, that was eventually changed to oral Keflex.  Urine culture grew gram-negative rods.  PCP should kindly follow the result of final urine culture.  Palliative care team was consulted.  After extensive discussion with the case management, social work team and patient's family, the patient family elected to take patient home with home health.  [facility] will follow patient at home.  Will also follow with the primary care provider and the palliative care team on discharge.  Mentioned above, the PCP will kindly follow final results of the urine culture.    AKI on CKD III:  Baseline creatinine close to 1.8.  Creat  On admit 2.5->2.2 Improved with IV fluid. Serum creatinine on 10/02/2018 was 2.01. PCP to continue monitoring renal function and electrolytes, depending on the goal of care.  Fever at home with SIRS: -UA showed WBC more than 50 large leukocyte esterase -Also having retention  -Suspect UTI, and patient is immunocompromised.   -Patient was initially treated with IV ceftriaxone empirically, and was discharged on Keflex.   -Urine culture was growing gram negative rods, with 2000 copies per milliliter.   -The microbiology lab was contacted, and directed to still run final cultures and sensitivity, considering patient is immunocompromised.    Status post renal transplanion several decades ago: Patient is on prednisone, mycophenolate and cyclosporine. Patient is known to Dr. Jimmy Footman and nephrologist.  AKI is resolving.  Kindly follow final urine culture result.   BPH: Patient is known to the urology team.   Urology was consulted during the  hospital stay.   Will be discharged home with Foley catheter in situ, as well as Flomax.   We will follow-up with urology team on discharge.   Urology team will decide, depending on the goal of care, if repeat cystoscopy will be pursued.   Currently, imaging study done during the hospital stay revealed some abnormalities of the bladder.   Patient was also retaining urine.  Stage II pressure ulcer sacrum, POA: Continue off loading, and wound care.  Poor oral intake/deconditioning/failure to thrive/Protein-calorie malnutrition, severe:   Consulted dietitian. Poor p.o. intake reported on presentation.  Anemia of chronic disease/Acute blood loss anemia:  Drop in hemoglobin from 9.7 g on admission (likely hemoconcentrated) to 7.2 g/dL at the following day. Patient was on Eliquis. GI and cardiology consulted. Eliquis discontinued. Patient was transfused with packed red blood cells. Hemoglobin on 10/03/2018 was 10.2 g/dL. Continue to monitor on discharge.  PAF: Noted to be in NSR Continue home Cardizem. Cardiology has advised discontinuing Eliquis. Patient's family do not want any further GI work-up. Continue to monitor.  Consults: cardiology, GI, urology and Palliative care medicine  Significant Diagnostic Studies:   Discharge Exam: Blood pressure 112/79, pulse 62, temperature 97.6 F (36.4 C), temperature source Oral, resp. rate 18, height 6\' 3"  (1.905 m), weight 61 kg, SpO2 100 %.  Disposition: Discharge disposition: 06-Home-Health Care Svc   Discharge Instructions    Diet - low sodium heart healthy   Complete by:  As directed    Increase activity slowly   Complete by:  As directed      Allergies as of 10/05/2018      Reactions   Ciprofloxacin Rash   Clindamycin/lincomycin Rash   Levofloxacin Rash   Septra [sulfamethoxazole-trimethoprim] Rash      Medication List    STOP taking these medications   ELIQUIS 2.5 MG Tabs tablet Generic drug:  apixaban    HYDROcodone-acetaminophen 5-325 MG tablet Commonly known as:  NORCO     TAKE these medications   acetaminophen 500 MG tablet Commonly known as:  TYLENOL Take 500 mg by mouth daily as needed for fever or headache (pain).   aspirin 81 MG chewable tablet Chew 1 tablet (81 mg total) by mouth daily.   cephALEXin 250 MG capsule Commonly known as:  KEFLEX Take 1 capsule (250 mg total) by mouth every 8 (eight) hours for 4 days.   cycloSPORINE modified 25 MG capsule Commonly known as:  NEORAL Take 25 mg by mouth See admin instructions. Take one capsule (25 mg) by mouth twice daily with a 100 mg capsule for a total dose of 125 mg   cycloSPORINE modified 100 MG capsule Commonly known as:  NEORAL Take 100 mg by mouth See admin instructions. Take one capsule (100 mg) by mouth twice daily with a 25 mg capsule for a total dose of 125 mg   diltiazem 180 MG 24 hr capsule Commonly known as:  CARDIZEM CD Take 1 capsule (180 mg total) by mouth daily.   docusate sodium 100 MG capsule Commonly known as:  COLACE Take 2 capsules (200 mg total) by mouth daily as needed for mild constipation. What changed:    how much to take  when to take this  reasons to take this   famotidine 20 MG tablet Commonly known as:  PEPCID Take 1 tablet (20 mg total) by mouth  at bedtime.   feeding supplement (ENSURE ENLIVE) Liqd Take 237 mLs by mouth 3 (three) times daily between meals. What changed:  when to take this   feeding supplement (PRO-STAT SUGAR FREE 64) Liqd Take 30 mLs by mouth 2 (two) times daily between meals.   mycophenolate 180 MG EC tablet Commonly known as:  MYFORTIC Take 180 mg by mouth 2 (two) times daily.   predniSONE 5 MG tablet Commonly known as:  DELTASONE Take 5 mg by mouth daily with breakfast.   tamsulosin 0.4 MG Caps capsule Commonly known as:  FLOMAX Take 1 capsule (0.4 mg total) by mouth daily after supper.      Time spent: 33 minutes.  SignedBonnell Public 10/05/2018, 1:20 PM

## 2018-10-05 NOTE — Plan of Care (Signed)
Pt DC home with daughter

## 2018-10-05 NOTE — Progress Notes (Signed)
[name] [name] to be D/C'd Home per MD order.  Discussed prescriptions and follow up appointments with the patient. Prescriptions given to patient, medication list explained in detail. Pt verbalized understanding. Case management set up home health-PT &OT. Daughter, [name] [name] to set up follow up appointment with urology. Pt DC with foley per MD [place].   Allergies as of 10/05/2018      Reactions   Ciprofloxacin Rash   Clindamycin/lincomycin Rash   Levofloxacin Rash   Septra [sulfamethoxazole-trimethoprim] Rash      Medication List    STOP taking these medications   ELIQUIS 2.5 MG Tabs tablet Generic drug:  apixaban   HYDROcodone-acetaminophen 5-325 MG tablet Commonly known as:  NORCO     TAKE these medications   acetaminophen 500 MG tablet Commonly known as:  TYLENOL Take 500 mg by mouth daily as needed for fever or headache (pain).   aspirin 81 MG chewable tablet Chew 1 tablet (81 mg total) by mouth daily.   cephALEXin 250 MG capsule Commonly known as:  KEFLEX Take 1 capsule (250 mg total) by mouth every 8 (eight) hours for 4 days.   cycloSPORINE modified 25 MG capsule Commonly known as:  NEORAL Take 25 mg by mouth See admin instructions. Take one capsule (25 mg) by mouth twice daily with a 100 mg capsule for a total dose of 125 mg   cycloSPORINE modified 100 MG capsule Commonly known as:  NEORAL Take 100 mg by mouth See admin instructions. Take one capsule (100 mg) by mouth twice daily with a 25 mg capsule for a total dose of 125 mg   diltiazem 180 MG 24 hr capsule Commonly known as:  CARDIZEM CD Take 1 capsule (180 mg total) by mouth daily.   docusate sodium 100 MG capsule Commonly known as:  COLACE Take 2 capsules (200 mg total) by mouth daily as needed for mild constipation. What changed:    how much to take  when to take this  reasons to take this   famotidine 20 MG tablet Commonly known as:  PEPCID Take 1 tablet (20 mg total) by mouth at bedtime.   feeding  supplement (ENSURE ENLIVE) Liqd Take 237 mLs by mouth 3 (three) times daily between meals. What changed:  when to take this   feeding supplement (PRO-STAT SUGAR FREE 64) Liqd Take 30 mLs by mouth 2 (two) times daily between meals.   mycophenolate 180 MG EC tablet Commonly known as:  MYFORTIC Take 180 mg by mouth 2 (two) times daily.   predniSONE 5 MG tablet Commonly known as:  DELTASONE Take 5 mg by mouth daily with breakfast.   tamsulosin 0.4 MG Caps capsule Commonly known as:  FLOMAX Take 1 capsule (0.4 mg total) by mouth daily after supper.       Vitals:   10/05/18 0425 10/05/18 0910  BP: (!) 120/97 112/79  Pulse: 67 62  Resp: 18 18  Temp: 97.6 F (36.4 C) 98 F (36.7 C)  SpO2: 99% 100%    Skin clean, dry and intact without evidence of skin break down, no evidence of skin tears noted. IV catheter discontinued intact. Site without signs and symptoms of complications. Dressing and pressure applied. Pt denies pain at this time. No complaints noted.  An After Visit Summary was printed and given to the patient. Patient escorted via [place], and D/C home via private auto.  [name] Math, RN [facility]M Phone [phone]

## 2018-10-06 DIAGNOSIS — Z7982 Long term (current) use of aspirin: Secondary | ICD-10-CM | POA: Diagnosis not present

## 2018-10-06 DIAGNOSIS — Z85528 Personal history of other malignant neoplasm of kidney: Secondary | ICD-10-CM | POA: Diagnosis not present

## 2018-10-06 DIAGNOSIS — Z8673 Personal history of transient ischemic attack (TIA), and cerebral infarction without residual deficits: Secondary | ICD-10-CM | POA: Diagnosis not present

## 2018-10-06 DIAGNOSIS — E860 Dehydration: Secondary | ICD-10-CM | POA: Diagnosis not present

## 2018-10-06 DIAGNOSIS — I129 Hypertensive chronic kidney disease with stage 1 through stage 4 chronic kidney disease, or unspecified chronic kidney disease: Secondary | ICD-10-CM | POA: Diagnosis not present

## 2018-10-06 DIAGNOSIS — Z9181 History of falling: Secondary | ICD-10-CM | POA: Diagnosis not present

## 2018-10-06 DIAGNOSIS — Z940 Kidney transplant status: Secondary | ICD-10-CM | POA: Diagnosis not present

## 2018-10-06 DIAGNOSIS — E785 Hyperlipidemia, unspecified: Secondary | ICD-10-CM | POA: Diagnosis not present

## 2018-10-06 DIAGNOSIS — N179 Acute kidney failure, unspecified: Secondary | ICD-10-CM | POA: Diagnosis not present

## 2018-10-06 DIAGNOSIS — F0391 Unspecified dementia with behavioral disturbance: Secondary | ICD-10-CM | POA: Diagnosis not present

## 2018-10-06 DIAGNOSIS — Z466 Encounter for fitting and adjustment of urinary device: Secondary | ICD-10-CM | POA: Diagnosis not present

## 2018-10-06 DIAGNOSIS — N390 Urinary tract infection, site not specified: Secondary | ICD-10-CM | POA: Diagnosis not present

## 2018-10-06 DIAGNOSIS — R338 Other retention of urine: Secondary | ICD-10-CM | POA: Diagnosis not present

## 2018-10-06 DIAGNOSIS — N2581 Secondary hyperparathyroidism of renal origin: Secondary | ICD-10-CM | POA: Diagnosis not present

## 2018-10-06 DIAGNOSIS — Z905 Acquired absence of kidney: Secondary | ICD-10-CM | POA: Diagnosis not present

## 2018-10-06 DIAGNOSIS — K5790 Diverticulosis of intestine, part unspecified, without perforation or abscess without bleeding: Secondary | ICD-10-CM | POA: Diagnosis not present

## 2018-10-06 DIAGNOSIS — I480 Paroxysmal atrial fibrillation: Secondary | ICD-10-CM | POA: Diagnosis not present

## 2018-10-06 DIAGNOSIS — D631 Anemia in chronic kidney disease: Secondary | ICD-10-CM | POA: Diagnosis not present

## 2018-10-06 DIAGNOSIS — E43 Unspecified severe protein-calorie malnutrition: Secondary | ICD-10-CM | POA: Diagnosis not present

## 2018-10-06 DIAGNOSIS — N401 Enlarged prostate with lower urinary tract symptoms: Secondary | ICD-10-CM | POA: Diagnosis not present

## 2018-10-06 DIAGNOSIS — N184 Chronic kidney disease, stage 4 (severe): Secondary | ICD-10-CM | POA: Diagnosis not present

## 2018-10-06 DIAGNOSIS — R627 Adult failure to thrive: Secondary | ICD-10-CM | POA: Diagnosis not present

## 2018-10-06 LAB — URINE CULTURE

## 2018-10-07 ENCOUNTER — Other Ambulatory Visit: Payer: Self-pay | Admitting: *Deleted

## 2018-10-07 DIAGNOSIS — N390 Urinary tract infection, site not specified: Secondary | ICD-10-CM

## 2018-10-07 DIAGNOSIS — N184 Chronic kidney disease, stage 4 (severe): Secondary | ICD-10-CM | POA: Diagnosis not present

## 2018-10-07 DIAGNOSIS — E860 Dehydration: Secondary | ICD-10-CM | POA: Diagnosis not present

## 2018-10-07 DIAGNOSIS — R339 Retention of urine, unspecified: Secondary | ICD-10-CM

## 2018-10-07 DIAGNOSIS — I129 Hypertensive chronic kidney disease with stage 1 through stage 4 chronic kidney disease, or unspecified chronic kidney disease: Secondary | ICD-10-CM | POA: Diagnosis not present

## 2018-10-07 DIAGNOSIS — N401 Enlarged prostate with lower urinary tract symptoms: Secondary | ICD-10-CM | POA: Diagnosis not present

## 2018-10-07 DIAGNOSIS — N179 Acute kidney failure, unspecified: Secondary | ICD-10-CM | POA: Diagnosis not present

## 2018-10-07 NOTE — Consult Note (Signed)
Made aware by Tommi Rumps with [facility] that Mr. Romanoski was enrolled in the [facility] program.  Will send notification to [facility] office to make aware of patient's enrollment in the [facility] program.  Marthenia Rolling, [place], RN,BSN [facility] Liaison [phone]

## 2018-10-07 NOTE — Care Management Note (Signed)
Case Management Note  Patient Details  Name: [name] [name] MRN: [id number] Date of Birth: 18-Jul-1928  Subjective/Objective:       Notified by Dr. Marthenia Rolling of urine culture result with sensitivities. Pt transitioned home over the weekend with [facility].             Action/Plan: Tommi Rumps at [facility] notified of results. He will f/u with the RN CM following patient now.   Expected Discharge Date:  10/05/18               Expected Discharge Plan:  [place]  In-House Referral:  Clinical Social Work  Discharge planning Services  CM Consult  Post Acute Care Choice:  Hospice(palliative care ) Choice offered to:  Patient, Adult Children  DME Arranged:  N/A DME Agency:  NA  HH Arranged:  PT, Nurse's Aide, OT HH Agency:  [facility]  Status of Service:  In process, will continue to follow  If discussed at Long Length of Stay Meetings, dates discussed:    Additional Comments:  Bartholomew Crews, RN 10/07/2018, 2:34 PM

## 2018-10-08 DIAGNOSIS — I129 Hypertensive chronic kidney disease with stage 1 through stage 4 chronic kidney disease, or unspecified chronic kidney disease: Secondary | ICD-10-CM | POA: Diagnosis not present

## 2018-10-08 DIAGNOSIS — E860 Dehydration: Secondary | ICD-10-CM | POA: Diagnosis not present

## 2018-10-08 DIAGNOSIS — N184 Chronic kidney disease, stage 4 (severe): Secondary | ICD-10-CM | POA: Diagnosis not present

## 2018-10-08 DIAGNOSIS — N390 Urinary tract infection, site not specified: Secondary | ICD-10-CM | POA: Diagnosis not present

## 2018-10-08 DIAGNOSIS — N179 Acute kidney failure, unspecified: Secondary | ICD-10-CM | POA: Diagnosis not present

## 2018-10-08 DIAGNOSIS — N401 Enlarged prostate with lower urinary tract symptoms: Secondary | ICD-10-CM | POA: Diagnosis not present

## 2018-10-09 DIAGNOSIS — N179 Acute kidney failure, unspecified: Secondary | ICD-10-CM | POA: Diagnosis not present

## 2018-10-09 DIAGNOSIS — N184 Chronic kidney disease, stage 4 (severe): Secondary | ICD-10-CM | POA: Diagnosis not present

## 2018-10-09 DIAGNOSIS — E860 Dehydration: Secondary | ICD-10-CM | POA: Diagnosis not present

## 2018-10-09 DIAGNOSIS — N390 Urinary tract infection, site not specified: Secondary | ICD-10-CM | POA: Diagnosis not present

## 2018-10-09 DIAGNOSIS — I129 Hypertensive chronic kidney disease with stage 1 through stage 4 chronic kidney disease, or unspecified chronic kidney disease: Secondary | ICD-10-CM | POA: Diagnosis not present

## 2018-10-09 DIAGNOSIS — N401 Enlarged prostate with lower urinary tract symptoms: Secondary | ICD-10-CM | POA: Diagnosis not present

## 2018-10-10 DIAGNOSIS — R338 Other retention of urine: Secondary | ICD-10-CM | POA: Diagnosis not present

## 2018-10-11 DIAGNOSIS — E860 Dehydration: Secondary | ICD-10-CM | POA: Diagnosis not present

## 2018-10-11 DIAGNOSIS — N390 Urinary tract infection, site not specified: Secondary | ICD-10-CM | POA: Diagnosis not present

## 2018-10-11 DIAGNOSIS — N184 Chronic kidney disease, stage 4 (severe): Secondary | ICD-10-CM | POA: Diagnosis not present

## 2018-10-11 DIAGNOSIS — N401 Enlarged prostate with lower urinary tract symptoms: Secondary | ICD-10-CM | POA: Diagnosis not present

## 2018-10-11 DIAGNOSIS — I129 Hypertensive chronic kidney disease with stage 1 through stage 4 chronic kidney disease, or unspecified chronic kidney disease: Secondary | ICD-10-CM | POA: Diagnosis not present

## 2018-10-11 DIAGNOSIS — N179 Acute kidney failure, unspecified: Secondary | ICD-10-CM | POA: Diagnosis not present

## 2018-10-14 DIAGNOSIS — N184 Chronic kidney disease, stage 4 (severe): Secondary | ICD-10-CM | POA: Diagnosis not present

## 2018-10-14 DIAGNOSIS — N390 Urinary tract infection, site not specified: Secondary | ICD-10-CM | POA: Diagnosis not present

## 2018-10-14 DIAGNOSIS — N179 Acute kidney failure, unspecified: Secondary | ICD-10-CM | POA: Diagnosis not present

## 2018-10-14 DIAGNOSIS — E860 Dehydration: Secondary | ICD-10-CM | POA: Diagnosis not present

## 2018-10-14 DIAGNOSIS — I129 Hypertensive chronic kidney disease with stage 1 through stage 4 chronic kidney disease, or unspecified chronic kidney disease: Secondary | ICD-10-CM | POA: Diagnosis not present

## 2018-10-14 DIAGNOSIS — N401 Enlarged prostate with lower urinary tract symptoms: Secondary | ICD-10-CM | POA: Diagnosis not present

## 2018-10-16 DIAGNOSIS — R338 Other retention of urine: Secondary | ICD-10-CM | POA: Diagnosis not present

## 2018-10-17 DIAGNOSIS — I129 Hypertensive chronic kidney disease with stage 1 through stage 4 chronic kidney disease, or unspecified chronic kidney disease: Secondary | ICD-10-CM | POA: Diagnosis not present

## 2018-10-17 DIAGNOSIS — N184 Chronic kidney disease, stage 4 (severe): Secondary | ICD-10-CM | POA: Diagnosis not present

## 2018-10-17 DIAGNOSIS — N401 Enlarged prostate with lower urinary tract symptoms: Secondary | ICD-10-CM | POA: Diagnosis not present

## 2018-10-17 DIAGNOSIS — N179 Acute kidney failure, unspecified: Secondary | ICD-10-CM | POA: Diagnosis not present

## 2018-10-17 DIAGNOSIS — N390 Urinary tract infection, site not specified: Secondary | ICD-10-CM | POA: Diagnosis not present

## 2018-10-17 DIAGNOSIS — E860 Dehydration: Secondary | ICD-10-CM | POA: Diagnosis not present

## 2018-10-18 DIAGNOSIS — E860 Dehydration: Secondary | ICD-10-CM | POA: Diagnosis not present

## 2018-10-18 DIAGNOSIS — N390 Urinary tract infection, site not specified: Secondary | ICD-10-CM | POA: Diagnosis not present

## 2018-10-18 DIAGNOSIS — N184 Chronic kidney disease, stage 4 (severe): Secondary | ICD-10-CM | POA: Diagnosis not present

## 2018-10-18 DIAGNOSIS — N401 Enlarged prostate with lower urinary tract symptoms: Secondary | ICD-10-CM | POA: Diagnosis not present

## 2018-10-18 DIAGNOSIS — I129 Hypertensive chronic kidney disease with stage 1 through stage 4 chronic kidney disease, or unspecified chronic kidney disease: Secondary | ICD-10-CM | POA: Diagnosis not present

## 2018-10-18 DIAGNOSIS — N179 Acute kidney failure, unspecified: Secondary | ICD-10-CM | POA: Diagnosis not present

## 2018-10-21 DIAGNOSIS — N184 Chronic kidney disease, stage 4 (severe): Secondary | ICD-10-CM | POA: Diagnosis not present

## 2018-10-21 DIAGNOSIS — N390 Urinary tract infection, site not specified: Secondary | ICD-10-CM | POA: Diagnosis not present

## 2018-10-21 DIAGNOSIS — N401 Enlarged prostate with lower urinary tract symptoms: Secondary | ICD-10-CM | POA: Diagnosis not present

## 2018-10-21 DIAGNOSIS — N179 Acute kidney failure, unspecified: Secondary | ICD-10-CM | POA: Diagnosis not present

## 2018-10-21 DIAGNOSIS — E860 Dehydration: Secondary | ICD-10-CM | POA: Diagnosis not present

## 2018-10-21 DIAGNOSIS — I129 Hypertensive chronic kidney disease with stage 1 through stage 4 chronic kidney disease, or unspecified chronic kidney disease: Secondary | ICD-10-CM | POA: Diagnosis not present

## 2018-10-22 DIAGNOSIS — E782 Mixed hyperlipidemia: Secondary | ICD-10-CM | POA: Diagnosis not present

## 2018-10-22 DIAGNOSIS — Z8673 Personal history of transient ischemic attack (TIA), and cerebral infarction without residual deficits: Secondary | ICD-10-CM | POA: Diagnosis not present

## 2018-10-22 DIAGNOSIS — R809 Proteinuria, unspecified: Secondary | ICD-10-CM | POA: Diagnosis not present

## 2018-10-22 DIAGNOSIS — R319 Hematuria, unspecified: Secondary | ICD-10-CM | POA: Diagnosis not present

## 2018-10-22 DIAGNOSIS — I4891 Unspecified atrial fibrillation: Secondary | ICD-10-CM | POA: Diagnosis not present

## 2018-10-22 DIAGNOSIS — Z85528 Personal history of other malignant neoplasm of kidney: Secondary | ICD-10-CM | POA: Diagnosis not present

## 2018-10-22 DIAGNOSIS — E46 Unspecified protein-calorie malnutrition: Secondary | ICD-10-CM | POA: Diagnosis not present

## 2018-10-22 DIAGNOSIS — Z7901 Long term (current) use of anticoagulants: Secondary | ICD-10-CM | POA: Diagnosis not present

## 2018-10-22 DIAGNOSIS — I129 Hypertensive chronic kidney disease with stage 1 through stage 4 chronic kidney disease, or unspecified chronic kidney disease: Secondary | ICD-10-CM | POA: Diagnosis not present

## 2018-10-22 DIAGNOSIS — E790 Hyperuricemia without signs of inflammatory arthritis and tophaceous disease: Secondary | ICD-10-CM | POA: Diagnosis not present

## 2018-10-22 DIAGNOSIS — N182 Chronic kidney disease, stage 2 (mild): Secondary | ICD-10-CM | POA: Diagnosis not present

## 2018-10-22 DIAGNOSIS — Z940 Kidney transplant status: Secondary | ICD-10-CM | POA: Diagnosis not present

## 2018-10-23 DIAGNOSIS — N184 Chronic kidney disease, stage 4 (severe): Secondary | ICD-10-CM | POA: Diagnosis not present

## 2018-10-23 DIAGNOSIS — N390 Urinary tract infection, site not specified: Secondary | ICD-10-CM | POA: Diagnosis not present

## 2018-10-23 DIAGNOSIS — N401 Enlarged prostate with lower urinary tract symptoms: Secondary | ICD-10-CM | POA: Diagnosis not present

## 2018-10-23 DIAGNOSIS — E860 Dehydration: Secondary | ICD-10-CM | POA: Diagnosis not present

## 2018-10-23 DIAGNOSIS — I129 Hypertensive chronic kidney disease with stage 1 through stage 4 chronic kidney disease, or unspecified chronic kidney disease: Secondary | ICD-10-CM | POA: Diagnosis not present

## 2018-10-23 DIAGNOSIS — N179 Acute kidney failure, unspecified: Secondary | ICD-10-CM | POA: Diagnosis not present

## 2018-10-28 DIAGNOSIS — R338 Other retention of urine: Secondary | ICD-10-CM | POA: Diagnosis not present

## 2018-10-28 DIAGNOSIS — I129 Hypertensive chronic kidney disease with stage 1 through stage 4 chronic kidney disease, or unspecified chronic kidney disease: Secondary | ICD-10-CM | POA: Diagnosis not present

## 2018-10-28 DIAGNOSIS — N179 Acute kidney failure, unspecified: Secondary | ICD-10-CM | POA: Diagnosis not present

## 2018-10-28 DIAGNOSIS — E860 Dehydration: Secondary | ICD-10-CM | POA: Diagnosis not present

## 2018-10-28 DIAGNOSIS — N390 Urinary tract infection, site not specified: Secondary | ICD-10-CM | POA: Diagnosis not present

## 2018-10-28 DIAGNOSIS — N184 Chronic kidney disease, stage 4 (severe): Secondary | ICD-10-CM | POA: Diagnosis not present

## 2018-10-28 DIAGNOSIS — N401 Enlarged prostate with lower urinary tract symptoms: Secondary | ICD-10-CM | POA: Diagnosis not present

## 2018-10-29 DIAGNOSIS — N179 Acute kidney failure, unspecified: Secondary | ICD-10-CM | POA: Diagnosis not present

## 2018-10-29 DIAGNOSIS — I129 Hypertensive chronic kidney disease with stage 1 through stage 4 chronic kidney disease, or unspecified chronic kidney disease: Secondary | ICD-10-CM | POA: Diagnosis not present

## 2018-10-29 DIAGNOSIS — N390 Urinary tract infection, site not specified: Secondary | ICD-10-CM | POA: Diagnosis not present

## 2018-10-29 DIAGNOSIS — N184 Chronic kidney disease, stage 4 (severe): Secondary | ICD-10-CM | POA: Diagnosis not present

## 2018-10-29 DIAGNOSIS — N401 Enlarged prostate with lower urinary tract symptoms: Secondary | ICD-10-CM | POA: Diagnosis not present

## 2018-10-29 DIAGNOSIS — R338 Other retention of urine: Secondary | ICD-10-CM | POA: Diagnosis not present

## 2018-10-29 DIAGNOSIS — R8279 Other abnormal findings on microbiological examination of urine: Secondary | ICD-10-CM | POA: Diagnosis not present

## 2018-10-29 DIAGNOSIS — E860 Dehydration: Secondary | ICD-10-CM | POA: Diagnosis not present

## 2018-10-31 ENCOUNTER — Ambulatory Visit (INDEPENDENT_AMBULATORY_CARE_PROVIDER_SITE_OTHER): Payer: Medicare Other | Admitting: Neurology

## 2018-10-31 ENCOUNTER — Encounter: Payer: Self-pay | Admitting: Neurology

## 2018-10-31 ENCOUNTER — Telehealth: Payer: Self-pay | Admitting: Neurology

## 2018-10-31 VITALS — BP 112/82 | HR 82 | Ht 75.0 in | Wt 139.3 lb

## 2018-10-31 DIAGNOSIS — R413 Other amnesia: Secondary | ICD-10-CM

## 2018-10-31 DIAGNOSIS — F0280 Dementia in other diseases classified elsewhere without behavioral disturbance: Secondary | ICD-10-CM

## 2018-10-31 DIAGNOSIS — E538 Deficiency of other specified B group vitamins: Secondary | ICD-10-CM | POA: Diagnosis not present

## 2018-10-31 DIAGNOSIS — G309 Alzheimer's disease, unspecified: Secondary | ICD-10-CM

## 2018-10-31 DIAGNOSIS — G301 Alzheimer's disease with late onset: Secondary | ICD-10-CM

## 2018-10-31 HISTORY — DX: Dementia in other diseases classified elsewhere, unspecified severity, without behavioral disturbance, psychotic disturbance, mood disturbance, and anxiety: F02.80

## 2018-10-31 MED ORDER — MEMANTINE HCL 5 MG PO TABS: ORAL_TABLET | ORAL | 0 refills | Status: DC

## 2018-10-31 NOTE — Progress Notes (Signed)
Rx for Namenda 5 mg faxed to [facility] on [address] # [phone]. Confirmation received.

## 2018-10-31 NOTE — Telephone Encounter (Signed)
Medicare order sent to GI. No auth they will reach out to the pt to schedule.  °

## 2018-10-31 NOTE — Progress Notes (Signed)
Reason for visit: Memory disturbance  Referring physician: Dr. Christella Scheuermann is a 85 y.o. male  History of present illness:  [name] [name] is an 85 year old right-handed black male with a history of end-stage renal disease, status post renal transplant, history of atrial fibrillation and rheumatoid arthritis.  The patient has had a progressive memory disturbance over the last 2 years, the patient comes in today with his daughter.  The patient lives with his daughter currently.  The patient gave up driving about 2 years ago because of some issues with directions with driving, and feeling unsafe on the road.  The patient has required assistance with keeping up with appointments over the last 2 years and in July 2019, the daughter has had to take over managing medications.  She also does the finances.  There has been a more significant alteration in his cognitive abilities since December 2019, the patient has been forgetting to take medications, he is forgetting to eat meals.  He has had a significant weight loss over the last 6 months.  The patient has generalized weakness, he does have some gait instability, he uses a walker to get around.  He does have some assistance during the day to help him bathe and dress.  The patient reports no numbness of the extremities.  There is no family history of memory problems.  The patient is sent to this office for further evaluation.  Past Medical History:  Diagnosis Date  . A-fib ([place])   . Anemia in CKD (chronic kidney disease)   . Arthritis   . Bilateral hydrocele   . CKD (chronic kidney disease), stage II    nephrologist-  deterding  . Diverticulosis   . Dysuria   . H/O kidney transplant    right 1980/  left 1994 with right transplanted kidney removal  . Hematuria   . High cholesterol   . History of adenomatous polyp of colon    VILLOUS ADENOMA POLYP  . History of asbestos exposure   . History of condyloma acuminatum    genital  .  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 History of renal cell carcinoma    S/P  BILATERAL NEPHRECTOMY AND RENAL TRANSPLANTS  . History of small bowel obstruction    12/ 2006  due to adhesions -- resolved without surgerical intervention  . History of TIA (transient ischemic attack)   . Hyperlipidemia   . Hypertension   . Proteinuria   . Renal failure   . Secondary hyperparathyroidism, renal ([place])   . Wears glasses     Past Surgical History:  Procedure Laterality Date  . COLONOSCOPY  10/17/2012   Procedure: COLONOSCOPY;  Surgeon: Beryle Beams, MD;  Location: [facility];  Service: Endoscopy;  Laterality: N/A;  . CYSTOSCOPY N/A 07/13/2015   Procedure: CYSTOSCOPY;  Surgeon: Irine Seal, MD;  Location[facility];  Service: Urology;  Laterality: N/A;  . HEMICOLECTOMY Right 1995   adenoma polyp  . HYDROCELE EXCISION Bilateral 07/13/2015   Procedure: RIGHT HYDROCELECTOMY, ADULT DRAINAGE OF LEFT HYDROCELE;  Surgeon: Irine Seal, MD;  Location: [facility];  Service: Urology;  Laterality: Bilateral;  . KIDNEY TRANSPLANT  right 1980; left 1994---  [facility]   right kidney removed 1989/  left kidney removed 1994    Family History  Problem Relation Age of Onset  . Hypertension Mother     Social history:  reports that he has  never smoked. He has never used smokeless tobacco. He reports that he does not drink alcohol or use drugs.  Medications:  Prior to Admission medications   Medication Sig Start Date End Date Taking? Authorizing Provider  acetaminophen (TYLENOL) 500 MG tablet Take 500 mg by mouth daily as needed for fever or headache (pain).   Yes [provider]  Amino Acids-Protein Hydrolys (FEEDING SUPPLEMENT, PRO-STAT SUGAR FREE 64,) LIQD Take 30 mLs by mouth 2 (two) times daily between meals. 10/05/18  Yes Bonnell Public, MD  aspirin 81 MG chewable tablet Chew 1 tablet (81 mg total) by mouth daily. 03/30/18  Yes  Regalado, Belkys A, MD  cycloSPORINE modified (NEORAL) 100 MG capsule Take 100 mg by mouth See admin instructions. Take one capsule (100 mg) by mouth twice daily with a 25 mg capsule for a total dose of 125 mg   Yes [provider]  cycloSPORINE modified (NEORAL) 25 MG capsule Take 25 mg by mouth See admin instructions. Take one capsule (25 mg) by mouth twice daily with a 100 mg capsule for a total dose of 125 mg   Yes [provider]  diltiazem (CARDIZEM CD) 180 MG 24 hr capsule Take 1 capsule (180 mg total) by mouth daily. Patient taking differently: Take 120 mg by mouth daily.  04/23/18  Yes Lendon Colonel, NP  docusate sodium (COLACE) 100 MG capsule Take 2 capsules (200 mg total) by mouth daily as needed for mild constipation. 10/05/18  Yes Dana Allan I, MD  famotidine (PEPCID) 20 MG tablet Take 1 tablet (20 mg total) by mouth at bedtime. 03/29/18  Yes Regalado, Belkys A, MD  feeding supplement, ENSURE ENLIVE, (ENSURE ENLIVE) LIQD Take 237 mLs by mouth 3 (three) times daily between meals. Patient taking differently: Take 237 mLs by mouth 2 (two) times daily between meals.  03/29/18  Yes Regalado, Belkys A, MD  mycophenolate (MYFORTIC) 180 MG EC tablet Take 180 mg by mouth 2 (two) times daily.   Yes [provider]  predniSONE (DELTASONE) 5 MG tablet Take 5 mg by mouth daily with breakfast.   Yes [provider]  tamsulosin (FLOMAX) 0.4 MG CAPS capsule Take 1 capsule (0.4 mg total) by mouth daily after supper. 10/05/18  Yes Bonnell Public, MD      Allergies  Allergen Reactions  . Ciprofloxacin Rash  . Clindamycin/Lincomycin Rash  . Levofloxacin Rash  . Septra [Sulfamethoxazole-Trimethoprim] Rash    ROS:  Out of a complete 14 system review of symptoms, the patient complains only of the following symptoms, and all other reviewed systems are negative.  Weight loss, fatigue [place]oles Shortness of breath Incontinence of the bowel,  constipation Incontinence of the bladder Anemia, easy bleeding Feeling cold, increased thirst Joint pain, joint swelling Memory loss, confusion, weakness, slurred speech, difficulty swallowing, tremor Not enough sleep, decreased energy, disinterest in activity  Blood pressure 112/82, pulse 82, height 6\' 3"  (1.905 m), weight 139 lb 5 oz (63.2 kg).  Physical Exam  General: The patient is alert and cooperative at the time of the examination.  Eyes: Pupils are equal, round, and reactive to light. Discs are flat bilaterally.  Neck: The neck is supple, no carotid bruits are noted.  Respiratory: The respiratory examination is clear.  Cardiovascular: The cardiovascular examination reveals a regular rate and rhythm, no obvious murmurs or rubs are noted.  Skin: Extremities are with 1-2+ edema below the knees bilaterally.  Neurologic Exam  Mental status: The patient is alert and  oriented x 2 at the time of the examination (not oriented to date). The Mini-Mental status examination done today shows a total score 17/30.  Cranial nerves: Facial symmetry is present. There is good sensation of the face to pinprick and soft touch bilaterally. The strength of the facial muscles and the muscles to head turning and shoulder shrug are normal bilaterally. Speech is well enunciated, no aphasia or dysarthria is noted. Extraocular movements are full. Visual fields are full. The tongue is midline, and the patient has symmetric elevation of the soft palate. No obvious hearing deficits are noted.  Motor: The motor testing reveals 4/5 strength with intrinsic muscles of the hands and with grip bilaterally, otherwise good strength in both upper extremities.  4-/5 strength with hip flexion is seen bilaterally, otherwise good strength in the lower extremities. Good symmetric motor tone is noted throughout.  Sensory: Sensory testing is intact to pinprick, soft touch, vibration sensation, and position sense on all 4  extremities. No evidence of extinction is noted.  Coordination: Cerebellar testing reveals good finger-nose-finger and heel-to-shin bilaterally.  Gait and station: Gait is wide-based, unsteady.  The patient normally uses a walker for ambulation.  Tandem gait was not attempted.  Romberg is negative.  Reflexes: Deep tendon reflexes are symmetric, but are depressed bilaterally. Toes are downgoing bilaterally.   Assessment/Plan:  1.  Progressive memory disturbance  2.  Gait disorder  The patient has advanced age, he appears to have some issues with failure to thrive with a significant amount of weight loss over the last 6 months or so.  The patient be sent for blood work today, he will have a CT scan of the brain.  We will try to avoid a medication such as Exelon or Aricept which may promote weight loss, the patient will be placed on Namenda.  The patient will follow-up in 6 months.  [name] Alexanders MD 10/31/2018 9:35 AM  [facility] [address] [place], [place] [address]  Phone [phone] Fax [phone]

## 2018-10-31 NOTE — Patient Instructions (Signed)
We will start Namenda for the memory. Call our office in one month if he is tolerating the medication for a new prescription.

## 2018-11-01 LAB — RPR: RPR Ser Ql: NONREACTIVE

## 2018-11-01 LAB — VITAMIN B12: Vitamin B-12: 942 pg/mL (ref 232–1245)

## 2018-11-01 LAB — SEDIMENTATION RATE: Sed Rate: 95 mm/hr — ABNORMAL HIGH (ref 0–30)

## 2018-11-04 DIAGNOSIS — N401 Enlarged prostate with lower urinary tract symptoms: Secondary | ICD-10-CM | POA: Diagnosis not present

## 2018-11-04 DIAGNOSIS — E860 Dehydration: Secondary | ICD-10-CM | POA: Diagnosis not present

## 2018-11-04 DIAGNOSIS — I129 Hypertensive chronic kidney disease with stage 1 through stage 4 chronic kidney disease, or unspecified chronic kidney disease: Secondary | ICD-10-CM | POA: Diagnosis not present

## 2018-11-04 DIAGNOSIS — N179 Acute kidney failure, unspecified: Secondary | ICD-10-CM | POA: Diagnosis not present

## 2018-11-04 DIAGNOSIS — N390 Urinary tract infection, site not specified: Secondary | ICD-10-CM | POA: Diagnosis not present

## 2018-11-04 DIAGNOSIS — N184 Chronic kidney disease, stage 4 (severe): Secondary | ICD-10-CM | POA: Diagnosis not present

## 2018-11-05 DIAGNOSIS — E785 Hyperlipidemia, unspecified: Secondary | ICD-10-CM | POA: Diagnosis not present

## 2018-11-05 DIAGNOSIS — Z8673 Personal history of transient ischemic attack (TIA), and cerebral infarction without residual deficits: Secondary | ICD-10-CM | POA: Diagnosis not present

## 2018-11-05 DIAGNOSIS — Z9181 History of falling: Secondary | ICD-10-CM | POA: Diagnosis not present

## 2018-11-05 DIAGNOSIS — I129 Hypertensive chronic kidney disease with stage 1 through stage 4 chronic kidney disease, or unspecified chronic kidney disease: Secondary | ICD-10-CM | POA: Diagnosis not present

## 2018-11-05 DIAGNOSIS — R627 Adult failure to thrive: Secondary | ICD-10-CM | POA: Diagnosis not present

## 2018-11-05 DIAGNOSIS — D631 Anemia in chronic kidney disease: Secondary | ICD-10-CM | POA: Diagnosis not present

## 2018-11-05 DIAGNOSIS — N401 Enlarged prostate with lower urinary tract symptoms: Secondary | ICD-10-CM | POA: Diagnosis not present

## 2018-11-05 DIAGNOSIS — N390 Urinary tract infection, site not specified: Secondary | ICD-10-CM | POA: Diagnosis not present

## 2018-11-05 DIAGNOSIS — I480 Paroxysmal atrial fibrillation: Secondary | ICD-10-CM | POA: Diagnosis not present

## 2018-11-05 DIAGNOSIS — Z85528 Personal history of other malignant neoplasm of kidney: Secondary | ICD-10-CM | POA: Diagnosis not present

## 2018-11-05 DIAGNOSIS — N179 Acute kidney failure, unspecified: Secondary | ICD-10-CM | POA: Diagnosis not present

## 2018-11-05 DIAGNOSIS — E43 Unspecified severe protein-calorie malnutrition: Secondary | ICD-10-CM | POA: Diagnosis not present

## 2018-11-05 DIAGNOSIS — N2581 Secondary hyperparathyroidism of renal origin: Secondary | ICD-10-CM | POA: Diagnosis not present

## 2018-11-05 DIAGNOSIS — Z940 Kidney transplant status: Secondary | ICD-10-CM | POA: Diagnosis not present

## 2018-11-05 DIAGNOSIS — E860 Dehydration: Secondary | ICD-10-CM | POA: Diagnosis not present

## 2018-11-05 DIAGNOSIS — Z466 Encounter for fitting and adjustment of urinary device: Secondary | ICD-10-CM | POA: Diagnosis not present

## 2018-11-05 DIAGNOSIS — F0391 Unspecified dementia with behavioral disturbance: Secondary | ICD-10-CM | POA: Diagnosis not present

## 2018-11-05 DIAGNOSIS — Z7982 Long term (current) use of aspirin: Secondary | ICD-10-CM | POA: Diagnosis not present

## 2018-11-05 DIAGNOSIS — R338 Other retention of urine: Secondary | ICD-10-CM | POA: Diagnosis not present

## 2018-11-05 DIAGNOSIS — K5790 Diverticulosis of intestine, part unspecified, without perforation or abscess without bleeding: Secondary | ICD-10-CM | POA: Diagnosis not present

## 2018-11-05 DIAGNOSIS — N184 Chronic kidney disease, stage 4 (severe): Secondary | ICD-10-CM | POA: Diagnosis not present

## 2018-11-05 DIAGNOSIS — Z905 Acquired absence of kidney: Secondary | ICD-10-CM | POA: Diagnosis not present

## 2018-11-07 ENCOUNTER — Ambulatory Visit
Admission: RE | Admit: 2018-11-07 | Discharge: 2018-11-07 | Disposition: A | Discharge status: Home / Self Care | Payer: Medicare Other | Source: Ambulatory Visit | Attending: Neurology | Admitting: Neurology

## 2018-11-07 DIAGNOSIS — R413 Other amnesia: Secondary | ICD-10-CM | POA: Diagnosis not present

## 2018-11-07 DIAGNOSIS — G301 Alzheimer's disease with late onset: Secondary | ICD-10-CM

## 2018-11-07 DIAGNOSIS — F0280 Dementia in other diseases classified elsewhere without behavioral disturbance: Secondary | ICD-10-CM

## 2018-11-08 ENCOUNTER — Telehealth: Payer: Self-pay | Admitting: Neurology

## 2018-11-08 NOTE — Telephone Encounter (Signed)
I called the patient, talk with the daughter.  CT scan of the brain shows generalized atrophy, the study shows fairly extensive white matter changes, this likely is at least a contributing factor to the memory issues and the balance problem that the patient reports.  His blood pressure was excellent during the last visit, he is on low-dose aspirin.  The patient will be getting on Namenda, they will call when he has gotten to the 10 mg twice daily dosing and I will call in a maintenance dose prescription.    CT head 11/07/18:  IMPRESSION: Abnormal CT scan of the head showing age-appropriate changes of generalized cerebral atrophy and chronic microvascular ischemia.  No acute abnormalities are noted.

## 2018-11-11 DIAGNOSIS — N390 Urinary tract infection, site not specified: Secondary | ICD-10-CM | POA: Diagnosis not present

## 2018-11-11 DIAGNOSIS — N401 Enlarged prostate with lower urinary tract symptoms: Secondary | ICD-10-CM | POA: Diagnosis not present

## 2018-11-11 DIAGNOSIS — N179 Acute kidney failure, unspecified: Secondary | ICD-10-CM | POA: Diagnosis not present

## 2018-11-11 DIAGNOSIS — I129 Hypertensive chronic kidney disease with stage 1 through stage 4 chronic kidney disease, or unspecified chronic kidney disease: Secondary | ICD-10-CM | POA: Diagnosis not present

## 2018-11-11 DIAGNOSIS — N184 Chronic kidney disease, stage 4 (severe): Secondary | ICD-10-CM | POA: Diagnosis not present

## 2018-11-11 DIAGNOSIS — E860 Dehydration: Secondary | ICD-10-CM | POA: Diagnosis not present

## 2018-11-13 DIAGNOSIS — E860 Dehydration: Secondary | ICD-10-CM | POA: Diagnosis not present

## 2018-11-13 DIAGNOSIS — N184 Chronic kidney disease, stage 4 (severe): Secondary | ICD-10-CM | POA: Diagnosis not present

## 2018-11-13 DIAGNOSIS — N401 Enlarged prostate with lower urinary tract symptoms: Secondary | ICD-10-CM | POA: Diagnosis not present

## 2018-11-13 DIAGNOSIS — I129 Hypertensive chronic kidney disease with stage 1 through stage 4 chronic kidney disease, or unspecified chronic kidney disease: Secondary | ICD-10-CM | POA: Diagnosis not present

## 2018-11-13 DIAGNOSIS — N179 Acute kidney failure, unspecified: Secondary | ICD-10-CM | POA: Diagnosis not present

## 2018-11-13 DIAGNOSIS — N390 Urinary tract infection, site not specified: Secondary | ICD-10-CM | POA: Diagnosis not present

## 2018-11-14 ENCOUNTER — Other Ambulatory Visit: Payer: Medicare Other | Admitting: *Deleted

## 2018-11-14 ENCOUNTER — Other Ambulatory Visit: Payer: Medicare Other | Admitting: Licensed Clinical Social Worker

## 2018-11-14 DIAGNOSIS — E860 Dehydration: Secondary | ICD-10-CM | POA: Diagnosis not present

## 2018-11-14 DIAGNOSIS — Z515 Encounter for palliative care: Secondary | ICD-10-CM

## 2018-11-14 DIAGNOSIS — N390 Urinary tract infection, site not specified: Secondary | ICD-10-CM | POA: Diagnosis not present

## 2018-11-14 DIAGNOSIS — I129 Hypertensive chronic kidney disease with stage 1 through stage 4 chronic kidney disease, or unspecified chronic kidney disease: Secondary | ICD-10-CM | POA: Diagnosis not present

## 2018-11-14 DIAGNOSIS — N401 Enlarged prostate with lower urinary tract symptoms: Secondary | ICD-10-CM | POA: Diagnosis not present

## 2018-11-14 DIAGNOSIS — N179 Acute kidney failure, unspecified: Secondary | ICD-10-CM | POA: Diagnosis not present

## 2018-11-14 DIAGNOSIS — N184 Chronic kidney disease, stage 4 (severe): Secondary | ICD-10-CM | POA: Diagnosis not present

## 2018-11-14 NOTE — Progress Notes (Signed)
COMMUNITY PALLIATIVE CARE SW NOTE  PATIENT NAME: [name] [name] DOB: August 01, 1928 MRN: [id number]  PRIMARY CARE PROVIDER: Mauricia Area, MD  RESPONSIBLE PARTY:  Acct ID - Guarantor Home Phone Work Phone Relationship Acct Type  [id number] - [name],[name] [phone]  Self P/F     [address], [name], [name] [address]     PLAN OF CARE and INTERVENTIONS:             1. GOALS OF CARE/ ADVANCE CARE PLANNING:  Goal is for patient to remain in his home.  Chart indicates patient as a DNR. 2. SOCIAL/EMOTIONAL/SPIRITUAL ASSESSMENT/ INTERVENTIONS:  SW and Palliative Care RN, [name], met with patient, his son, [name] [name], and daughter, [name] [name].  [name] [name] lives in [name], but visits his father regularly and will assist with bathing.  [name] [name] has a 26 year old daughter and an adult son.  Patient was born in [place] and moved to [place] for work.  He met his wife, who was from [place].  She died in 12-08-02.  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  Patient had a twin sister that died a few years ago.  His daughter stated he has been talking about her more recently.  [name] [name] will be returning to work soon and is seeking assistance for patient in his home.  SW provided education regarding resources.  SW to also contact [employer] Whe[employer] for patient. 3. PATIENT/CAREGIVER EDUCATION/ COPING:  Patient copes by expressing himself and uses humor.  Provided education regarding Palliative Care. 4. PERSONAL EMERGENCY PLAN:  Patient's daughter stays with him and provides care. 5. COMMUNITY RESOURCES COORDINATION/ HEALTH CARE NAVIGATION:  None. 6. FINANCIAL/LEGAL CONCERNS/INTERVENTIONS:  Patient is on a fixed income.  His daughter is currently applying for Medicaid.     SOCIAL HX:  Social History   Tobacco Use  . Smoking status: Never Smoker  . Smokeless tobacco: Never Used  Substance Use Topics  . Alcohol use: No    CODE  STATUS:  DNR  ADVANCED DIRECTIVES: N MOST FORM COMPLETE:  N HOSPICE EDUCATION PROVIDED:  N PPS:  Patient's appetite is normal per family.  He uses a walker to ambulate. Duration of visit and documentation:  90 minutes.      Creola Corn Aryeh Butterfield, LCSW

## 2018-11-19 DIAGNOSIS — N184 Chronic kidney disease, stage 4 (severe): Secondary | ICD-10-CM | POA: Diagnosis not present

## 2018-11-19 DIAGNOSIS — N390 Urinary tract infection, site not specified: Secondary | ICD-10-CM | POA: Diagnosis not present

## 2018-11-19 DIAGNOSIS — I129 Hypertensive chronic kidney disease with stage 1 through stage 4 chronic kidney disease, or unspecified chronic kidney disease: Secondary | ICD-10-CM | POA: Diagnosis not present

## 2018-11-19 DIAGNOSIS — N401 Enlarged prostate with lower urinary tract symptoms: Secondary | ICD-10-CM | POA: Diagnosis not present

## 2018-11-19 DIAGNOSIS — N179 Acute kidney failure, unspecified: Secondary | ICD-10-CM | POA: Diagnosis not present

## 2018-11-19 DIAGNOSIS — E860 Dehydration: Secondary | ICD-10-CM | POA: Diagnosis not present

## 2018-11-19 NOTE — Progress Notes (Signed)
COMMUNITY PALLIATIVE CARE RN NOTE  PATIENT NAME: Linwood Gullikson DOB: 1928-05-22 MRN: [id number]  PRIMARY CARE PROVIDER: Mauricia Area, MD  RESPONSIBLE PARTY:  Acct ID - Guarantor Home Phone Work Phone Relationship Acct Type  [id number] - Ishii,Kathan [phone]  Self P/F     [address], [place], [place] [address]    PLAN OF CARE and INTERVENTION:  1. ADVANCE CARE PLANNING/GOALS OF CARE: He wants to remain in his home. He is a DNR. 2. PATIENT/CAREGIVER EDUCATION: Explained Palliative Care Services 3. DISEASE STATUS: Joint visit made with Palliative Care SW, Lynn Duffy. Met with patient's son, Lauren and daughter, Jerrell Belfast. Recent health history provided. Patient had a recent Neurology visit for a brain scan. Daughter says it shows patient has had several small strokes over the years. Noticing increased intermittent confusion. Recently started on Namenda. Daughter has been at home with patient and is looking for help for him during the day. Information provided by SW to help with finding caregivers. Patient has been working with PT/OT/ST. He is now ambulatory with a walker. Daughter advised that he had been having issues with lower extremity edema. He is now taking a whole fluid pill. He is doing better with swallowing. He is eating a mechanical soft diet with finely chopped meats. Continues with thin liquids. Occasional coughing noted during meals. He takes his medications whole with fluids. He drinks Ensure TID for nutritional supplementation. His intake is variable. Son states that he is starting to gain weight again. Patient in his room initially, but came to living room for assessment. He is alert and oriented x 2, pleasant and engaging. He denies pain. He requires 1 person assistance with dressing and hygiene, but is able to feed himself independently. Bilateral wrist and finger contractures noted. Daughter says she has placed a call in to a Podiatrist and awaiting call back to schedule a visit.  Reports brittle nails and wants him assessed for nail fungus. Will continue to monitor.   HISTORY OF PRESENT ILLNESS:  This is 85 yo male who resides at home. He has a very supportive family. Palliative Care Team now following patient. Next visit scheduled in 2 weeks.   CODE STATUS: DNR ADVANCED DIRECTIVES: N MOST FORM: no PPS: 50%   PHYSICAL EXAM:   VITALS: Today's Vitals   11/14/18 1206  BP: 110/78  Pulse: 75  Resp: 18  Temp: 97.7 F (36.5 C)  TempSrc: Temporal  PainSc: 0-No pain    LUNGS: expiratory wheezes bilaterally, No dyspnea CARDIAC: Cor RRR EXTREMITIES: Trace edema noted to R lower extremity SKIN: Exposed skin is dry and intact  NEURO: Alert and oriented x 2 (person/place), HOH, pleasant mood, generalized weakness, ambulatory   (Duration of visit and documentation 120 minutes)    [name], RN BSN

## 2018-11-21 DIAGNOSIS — I129 Hypertensive chronic kidney disease with stage 1 through stage 4 chronic kidney disease, or unspecified chronic kidney disease: Secondary | ICD-10-CM | POA: Diagnosis not present

## 2018-11-21 DIAGNOSIS — N401 Enlarged prostate with lower urinary tract symptoms: Secondary | ICD-10-CM | POA: Diagnosis not present

## 2018-11-21 DIAGNOSIS — N179 Acute kidney failure, unspecified: Secondary | ICD-10-CM | POA: Diagnosis not present

## 2018-11-21 DIAGNOSIS — N390 Urinary tract infection, site not specified: Secondary | ICD-10-CM | POA: Diagnosis not present

## 2018-11-21 DIAGNOSIS — E860 Dehydration: Secondary | ICD-10-CM | POA: Diagnosis not present

## 2018-11-21 DIAGNOSIS — N184 Chronic kidney disease, stage 4 (severe): Secondary | ICD-10-CM | POA: Diagnosis not present

## 2018-11-26 DIAGNOSIS — N179 Acute kidney failure, unspecified: Secondary | ICD-10-CM | POA: Diagnosis not present

## 2018-11-26 DIAGNOSIS — N184 Chronic kidney disease, stage 4 (severe): Secondary | ICD-10-CM | POA: Diagnosis not present

## 2018-11-26 DIAGNOSIS — E860 Dehydration: Secondary | ICD-10-CM | POA: Diagnosis not present

## 2018-11-26 DIAGNOSIS — N390 Urinary tract infection, site not specified: Secondary | ICD-10-CM | POA: Diagnosis not present

## 2018-11-26 DIAGNOSIS — I129 Hypertensive chronic kidney disease with stage 1 through stage 4 chronic kidney disease, or unspecified chronic kidney disease: Secondary | ICD-10-CM | POA: Diagnosis not present

## 2018-11-26 DIAGNOSIS — N401 Enlarged prostate with lower urinary tract symptoms: Secondary | ICD-10-CM | POA: Diagnosis not present

## 2018-11-27 ENCOUNTER — Other Ambulatory Visit: Payer: Self-pay

## 2018-11-27 ENCOUNTER — Other Ambulatory Visit: Payer: Medicare Other | Admitting: *Deleted

## 2018-11-27 DIAGNOSIS — Z515 Encounter for palliative care: Secondary | ICD-10-CM

## 2018-12-03 NOTE — Progress Notes (Signed)
COMMUNITY PALLIATIVE CARE RN NOTE  PATIENT NAME: [name] [name] DOB: 1927/10/11 MRN: [id number]  PRIMARY CARE PROVIDER: Mauricia Area, MD  RESPONSIBLE PARTY:  Acct ID - Guarantor Home Phone Work Phone Relationship Acct Type  [id number] - [name] [name],[name] [name] [phone]  Self P/F     [address], [place], [place] [address]    PLAN OF CARE and INTERVENTION:  1. ADVANCE CARE PLANNING/GOALS OF CARE: He wants to remain in his home with daughter as caretaker. He is a DNR. 2. PATIENT/CAREGIVER EDUCATION: Reinforced Safe Mobility 3. DISEASE STATUS: Met with patient and daughter in their home. Observed patient ambulate into the living room from his bedroom using his walker. Gait was steady, but he appeared weak and is not feeling well. He reports that he feels he has a "head" cold, as he is experiencing sinus congestion. Eyes are glassy. He denies pain at time of visit. He continues to require 1 person assistance with bathing, dressing and toileting. He remains able to feed himself. Continues with bilateral fingers/wrist contractures. His intake remains variable. He states he doesn't have much of an appetite, but does try to eat something. He remains on a mechanical soft diet with finely chopped meats. Denies issues with dysphagia since working with Speech Therapist. He also drinks Ensure for nutritional supplementation. He has completed his sessions with PT/OT/ST. Denies any needs at this time.   HISTORY OF PRESENT ILLNESS:  This is a 85 yo male who resides at home with his daughter as caretaker. Palliative Care Team continues to follow patient. Will continue to visit monthly and PRN.  CODE STATUS: DNR  ADVANCED DIRECTIVES: N MOST FORM: no PPS: 40%   PHYSICAL EXAM:   LUNGS: clear to auscultation  CARDIAC: Cor RRR EXTREMITIES: Trace edema to bilateral lower extremities R>L SKIN: Exposed skin is dry and intact  NEURO: Alert and oriented x 2, forgetful, pleasant mood, ambulatory with  walker   (Duration of visit and documentation 45 minutes)    [name], RN BSN

## 2018-12-16 ENCOUNTER — Telehealth: Payer: Self-pay

## 2018-12-16 MED ORDER — MEMANTINE HCL 5 MG PO TABS: ORAL_TABLET | ORAL | 2 refills | Status: DC

## 2018-12-16 MED ORDER — MEMANTINE HCL 5 MG PO TABS: ORAL_TABLET | ORAL | 2 refills | Status: AC

## 2018-12-16 NOTE — Telephone Encounter (Signed)
Refill for namenda submitted to [facility] on file.

## 2019-01-15 ENCOUNTER — Other Ambulatory Visit: Payer: Self-pay

## 2019-01-15 ENCOUNTER — Inpatient Hospital Stay (HOSPITAL_COMMUNITY): Payer: Medicare Other

## 2019-01-15 ENCOUNTER — Emergency Department (HOSPITAL_COMMUNITY): Payer: Medicare Other

## 2019-01-15 ENCOUNTER — Inpatient Hospital Stay (HOSPITAL_COMMUNITY)
Admission: EM | Admit: 2019-01-15 | Discharge: 2019-02-10 | DRG: 698 | Disposition: E | Payer: Medicare Other | Attending: Student in an Organized Health Care Education/Training Program | Admitting: Student in an Organized Health Care Education/Training Program

## 2019-01-15 ENCOUNTER — Encounter (HOSPITAL_COMMUNITY): Payer: Self-pay | Admitting: Surgery

## 2019-01-15 DIAGNOSIS — R6521 Severe sepsis with septic shock: Secondary | ICD-10-CM | POA: Diagnosis present

## 2019-01-15 DIAGNOSIS — R64 Cachexia: Secondary | ICD-10-CM | POA: Diagnosis present

## 2019-01-15 DIAGNOSIS — N12 Tubulo-interstitial nephritis, not specified as acute or chronic: Secondary | ICD-10-CM | POA: Diagnosis present

## 2019-01-15 DIAGNOSIS — Z881 Allergy status to other antibiotic agents status: Secondary | ICD-10-CM

## 2019-01-15 DIAGNOSIS — Z66 Do not resuscitate: Secondary | ICD-10-CM | POA: Diagnosis present

## 2019-01-15 DIAGNOSIS — I129 Hypertensive chronic kidney disease with stage 1 through stage 4 chronic kidney disease, or unspecified chronic kidney disease: Secondary | ICD-10-CM | POA: Diagnosis present

## 2019-01-15 DIAGNOSIS — R350 Frequency of micturition: Secondary | ICD-10-CM | POA: Diagnosis not present

## 2019-01-15 DIAGNOSIS — A419 Sepsis, unspecified organism: Secondary | ICD-10-CM | POA: Diagnosis present

## 2019-01-15 DIAGNOSIS — N179 Acute kidney failure, unspecified: Secondary | ICD-10-CM

## 2019-01-15 DIAGNOSIS — J181 Lobar pneumonia, unspecified organism: Principal | ICD-10-CM

## 2019-01-15 DIAGNOSIS — J189 Pneumonia, unspecified organism: Secondary | ICD-10-CM | POA: Diagnosis present

## 2019-01-15 DIAGNOSIS — R531 Weakness: Secondary | ICD-10-CM

## 2019-01-15 DIAGNOSIS — Z7982 Long term (current) use of aspirin: Secondary | ICD-10-CM

## 2019-01-15 DIAGNOSIS — I4891 Unspecified atrial fibrillation: Secondary | ICD-10-CM | POA: Diagnosis present

## 2019-01-15 DIAGNOSIS — D631 Anemia in chronic kidney disease: Secondary | ICD-10-CM | POA: Diagnosis present

## 2019-01-15 DIAGNOSIS — F0391 Unspecified dementia with behavioral disturbance: Secondary | ICD-10-CM | POA: Diagnosis present

## 2019-01-15 DIAGNOSIS — Z20828 Contact with and (suspected) exposure to other viral communicable diseases: Secondary | ICD-10-CM | POA: Diagnosis present

## 2019-01-15 DIAGNOSIS — I2510 Atherosclerotic heart disease of native coronary artery without angina pectoris: Secondary | ICD-10-CM

## 2019-01-15 DIAGNOSIS — J439 Emphysema, unspecified: Secondary | ICD-10-CM | POA: Diagnosis present

## 2019-01-15 DIAGNOSIS — D649 Anemia, unspecified: Secondary | ICD-10-CM

## 2019-01-15 DIAGNOSIS — B965 Pseudomonas (aeruginosa) (mallei) (pseudomallei) as the cause of diseases classified elsewhere: Secondary | ICD-10-CM | POA: Diagnosis not present

## 2019-01-15 DIAGNOSIS — Z9079 Acquired absence of other genital organ(s): Secondary | ICD-10-CM

## 2019-01-15 DIAGNOSIS — I469 Cardiac arrest, cause unspecified: Secondary | ICD-10-CM | POA: Diagnosis not present

## 2019-01-15 DIAGNOSIS — T8613 Kidney transplant infection: Principal | ICD-10-CM | POA: Diagnosis present

## 2019-01-15 DIAGNOSIS — E43 Unspecified severe protein-calorie malnutrition: Secondary | ICD-10-CM | POA: Diagnosis present

## 2019-01-15 DIAGNOSIS — G309 Alzheimer's disease, unspecified: Secondary | ICD-10-CM | POA: Diagnosis present

## 2019-01-15 DIAGNOSIS — R338 Other retention of urine: Secondary | ICD-10-CM

## 2019-01-15 DIAGNOSIS — N19 Unspecified kidney failure: Secondary | ICD-10-CM | POA: Diagnosis not present

## 2019-01-15 DIAGNOSIS — R4182 Altered mental status, unspecified: Secondary | ICD-10-CM | POA: Diagnosis not present

## 2019-01-15 DIAGNOSIS — E875 Hyperkalemia: Secondary | ICD-10-CM | POA: Diagnosis not present

## 2019-01-15 DIAGNOSIS — N401 Enlarged prostate with lower urinary tract symptoms: Secondary | ICD-10-CM | POA: Diagnosis present

## 2019-01-15 DIAGNOSIS — Z9889 Other specified postprocedural states: Secondary | ICD-10-CM

## 2019-01-15 DIAGNOSIS — N390 Urinary tract infection, site not specified: Secondary | ICD-10-CM

## 2019-01-15 DIAGNOSIS — R627 Adult failure to thrive: Secondary | ICD-10-CM | POA: Diagnosis present

## 2019-01-15 DIAGNOSIS — A4152 Sepsis due to Pseudomonas: Secondary | ICD-10-CM | POA: Diagnosis present

## 2019-01-15 DIAGNOSIS — E631 Imbalance of constituents of food intake: Secondary | ICD-10-CM | POA: Diagnosis not present

## 2019-01-15 DIAGNOSIS — B9620 Unspecified Escherichia coli [E. coli] as the cause of diseases classified elsewhere: Secondary | ICD-10-CM | POA: Diagnosis not present

## 2019-01-15 DIAGNOSIS — Z87448 Personal history of other diseases of urinary system: Secondary | ICD-10-CM

## 2019-01-15 DIAGNOSIS — Z888 Allergy status to other drugs, medicaments and biological substances status: Secondary | ICD-10-CM

## 2019-01-15 DIAGNOSIS — Z681 Body mass index (BMI) 19 or less, adult: Secondary | ICD-10-CM | POA: Diagnosis not present

## 2019-01-15 DIAGNOSIS — R1314 Dysphagia, pharyngoesophageal phase: Secondary | ICD-10-CM | POA: Diagnosis present

## 2019-01-15 DIAGNOSIS — N3000 Acute cystitis without hematuria: Secondary | ICD-10-CM

## 2019-01-15 DIAGNOSIS — Y830 Surgical operation with transplant of whole organ as the cause of abnormal reaction of the patient, or of later complication, without mention of misadventure at the time of the procedure: Secondary | ICD-10-CM | POA: Diagnosis present

## 2019-01-15 DIAGNOSIS — N400 Enlarged prostate without lower urinary tract symptoms: Secondary | ICD-10-CM | POA: Diagnosis present

## 2019-01-15 DIAGNOSIS — N170 Acute kidney failure with tubular necrosis: Secondary | ICD-10-CM | POA: Diagnosis not present

## 2019-01-15 DIAGNOSIS — E46 Unspecified protein-calorie malnutrition: Secondary | ICD-10-CM

## 2019-01-15 DIAGNOSIS — Z7952 Long term (current) use of systemic steroids: Secondary | ICD-10-CM

## 2019-01-15 DIAGNOSIS — Z8619 Personal history of other infectious and parasitic diseases: Secondary | ICD-10-CM

## 2019-01-15 DIAGNOSIS — F0280 Dementia in other diseases classified elsewhere without behavioral disturbance: Secondary | ICD-10-CM | POA: Diagnosis present

## 2019-01-15 DIAGNOSIS — N184 Chronic kidney disease, stage 4 (severe): Secondary | ICD-10-CM | POA: Diagnosis not present

## 2019-01-15 DIAGNOSIS — Z515 Encounter for palliative care: Secondary | ICD-10-CM | POA: Diagnosis not present

## 2019-01-15 DIAGNOSIS — I959 Hypotension, unspecified: Secondary | ICD-10-CM | POA: Diagnosis not present

## 2019-01-15 DIAGNOSIS — F03918 Unspecified dementia, unspecified severity, with other behavioral disturbance: Secondary | ICD-10-CM | POA: Diagnosis present

## 2019-01-15 DIAGNOSIS — T8619 Other complication of kidney transplant: Secondary | ICD-10-CM

## 2019-01-15 DIAGNOSIS — E2740 Unspecified adrenocortical insufficiency: Secondary | ICD-10-CM

## 2019-01-15 DIAGNOSIS — R680 Hypothermia, not associated with low environmental temperature: Secondary | ICD-10-CM | POA: Diagnosis not present

## 2019-01-15 DIAGNOSIS — R319 Hematuria, unspecified: Secondary | ICD-10-CM

## 2019-01-15 DIAGNOSIS — N183 Chronic kidney disease, stage 3 (moderate): Secondary | ICD-10-CM | POA: Diagnosis not present

## 2019-01-15 DIAGNOSIS — R6520 Severe sepsis without septic shock: Secondary | ICD-10-CM | POA: Diagnosis not present

## 2019-01-15 DIAGNOSIS — Z79899 Other long term (current) drug therapy: Secondary | ICD-10-CM

## 2019-01-15 DIAGNOSIS — Z940 Kidney transplant status: Secondary | ICD-10-CM

## 2019-01-15 DIAGNOSIS — E871 Hypo-osmolality and hyponatremia: Secondary | ICD-10-CM | POA: Diagnosis not present

## 2019-01-15 DIAGNOSIS — Z905 Acquired absence of kidney: Secondary | ICD-10-CM

## 2019-01-15 DIAGNOSIS — A4151 Sepsis due to Escherichia coli [E. coli]: Secondary | ICD-10-CM | POA: Diagnosis present

## 2019-01-15 DIAGNOSIS — Z9049 Acquired absence of other specified parts of digestive tract: Secondary | ICD-10-CM

## 2019-01-15 DIAGNOSIS — Z85528 Personal history of other malignant neoplasm of kidney: Secondary | ICD-10-CM

## 2019-01-15 LAB — COMPREHENSIVE METABOLIC PANEL
ALT: 15 U/L (ref 0–44)
AST: 35 U/L (ref 15–41)
Albumin: 1.9 g/dL — ABNORMAL LOW (ref 3.5–5.0)
Alkaline Phosphatase: 58 U/L (ref 38–126)
Anion gap: 19 — ABNORMAL HIGH (ref 5–15)
BUN: 210 mg/dL — ABNORMAL HIGH (ref 8–23)
CO2: 23 mmol/L (ref 22–32)
Calcium: 9 mg/dL (ref 8.9–10.3)
Chloride: 91 mmol/L — ABNORMAL LOW (ref 98–111)
Creatinine, Ser: 5.29 mg/dL — ABNORMAL HIGH (ref 0.61–1.24)
GFR calc Af Amer: 10 mL/min — ABNORMAL LOW (ref >60)
GFR calc non Af Amer: 9 mL/min — ABNORMAL LOW (ref >60)
Glucose, Bld: 130 mg/dL — ABNORMAL HIGH (ref 70–99)
Potassium: 5.5 mmol/L — ABNORMAL HIGH (ref 3.5–5.1)
Sodium: 133 mmol/L — ABNORMAL LOW (ref 135–145)
Total Bilirubin: 1.2 mg/dL (ref 0.3–1.2)
Total Protein: 7 g/dL (ref 6.5–8.1)

## 2019-01-15 LAB — CBC WITH DIFFERENTIAL/PLATELET
Abs Immature Granulocytes: 0.05 10*3/uL (ref 0.00–0.07)
Basophils Absolute: 0 10*3/uL (ref 0.0–0.1)
Basophils Relative: 0 %
Eosinophils Absolute: 0 10*3/uL (ref 0.0–0.5)
Eosinophils Relative: 0 %
HCT: 24.8 % — ABNORMAL LOW (ref 39.0–52.0)
Hemoglobin: 8.3 g/dL — ABNORMAL LOW (ref 13.0–17.0)
Immature Granulocytes: 0 %
Lymphocytes Relative: 4 %
Lymphs Abs: 0.6 10*3/uL — ABNORMAL LOW (ref 0.7–4.0)
MCH: 29.2 pg (ref 26.0–34.0)
MCHC: 33.5 g/dL (ref 30.0–36.0)
MCV: 87.3 fL (ref 80.0–100.0)
Monocytes Absolute: 0.3 10*3/uL (ref 0.1–1.0)
Monocytes Relative: 2 %
Neutro Abs: 12.1 10*3/uL — ABNORMAL HIGH (ref 1.7–7.7)
Neutrophils Relative %: 94 %
Platelets: 205 10*3/uL (ref 150–400)
RBC: 2.84 MIL/uL — ABNORMAL LOW (ref 4.22–5.81)
RDW: 18.2 % — ABNORMAL HIGH (ref 11.5–15.5)
WBC: 13 10*3/uL — ABNORMAL HIGH (ref 4.0–10.5)
nRBC: 0 % (ref 0.0–0.2)

## 2019-01-15 LAB — URINALYSIS, ROUTINE W REFLEX MICROSCOPIC
Bilirubin Urine: NEGATIVE
Glucose, UA: NEGATIVE mg/dL
Ketones, ur: NEGATIVE mg/dL
Nitrite: NEGATIVE
Protein, ur: 30 mg/dL — AB
Specific Gravity, Urine: 1.01 (ref 1.005–1.030)
WBC, UA: >50 WBC/hpf — ABNORMAL HIGH (ref 0–5)
pH: 6 (ref 5.0–8.0)

## 2019-01-15 LAB — RENAL FUNCTION PANEL
Albumin: 1.8 g/dL — ABNORMAL LOW (ref 3.5–5.0)
Anion gap: 20 — ABNORMAL HIGH (ref 5–15)
BUN: 196 mg/dL — ABNORMAL HIGH (ref 8–23)
CO2: 21 mmol/L — ABNORMAL LOW (ref 22–32)
Calcium: 8.7 mg/dL — ABNORMAL LOW (ref 8.9–10.3)
Chloride: 94 mmol/L — ABNORMAL LOW (ref 98–111)
Creatinine, Ser: 4.88 mg/dL — ABNORMAL HIGH (ref 0.61–1.24)
GFR calc Af Amer: 11 mL/min — ABNORMAL LOW (ref >60)
GFR calc non Af Amer: 10 mL/min — ABNORMAL LOW (ref >60)
Glucose, Bld: 122 mg/dL — ABNORMAL HIGH (ref 70–99)
Phosphorus: 5.8 mg/dL — ABNORMAL HIGH (ref 2.5–4.6)
Potassium: 4.7 mmol/L (ref 3.5–5.1)
Sodium: 135 mmol/L (ref 135–145)

## 2019-01-15 LAB — TROPONIN I
Troponin I: 0.09 ng/mL (ref <0.03)
Troponin I: 0.08 ng/mL (ref <0.03)

## 2019-01-15 LAB — MRSA PCR SCREENING: MRSA by PCR: NEGATIVE

## 2019-01-15 LAB — LACTIC ACID, PLASMA
Lactic Acid, Venous: 2 mmol/L (ref 0.5–1.9)
Lactic Acid, Venous: 1.9 mmol/L (ref 0.5–1.9)

## 2019-01-15 LAB — SARS CORONAVIRUS 2 BY RT PCR (HOSPITAL ORDER, PERFORMED IN ~~LOC~~ HOSPITAL LAB): SARS Coronavirus 2: NEGATIVE

## 2019-01-15 LAB — CBG MONITORING, ED: Glucose-Capillary: 97 mg/dL (ref 70–99)

## 2019-01-15 MED ORDER — VANCOMYCIN VARIABLE DOSE PER UNSTABLE RENAL FUNCTION (PHARMACIST DOSING): Status: DC

## 2019-01-15 MED ORDER — VANCOMYCIN HCL IN DEXTROSE 1-5 GM/200ML-% IV SOLN
1000.0000 mg | Freq: Once | INTRAVENOUS | Status: DC
  Filled 2019-01-15: qty 200

## 2019-01-15 MED ORDER — CYCLOSPORINE MODIFIED (NEORAL) 25 MG PO CAPS
25.0000 mg | ORAL_CAPSULE | Freq: Two times a day (BID) | ORAL | Status: DC
  Filled 2019-01-15: qty 1

## 2019-01-15 MED ORDER — DEXTROSE 5 % IV SOLN
500.0000 mg | INTRAVENOUS | Status: DC
  Administered 2019-01-16 – 2019-01-24 (×9): 500 mg via INTRAVENOUS
  Filled 2019-01-15 (×14): qty 0.5

## 2019-01-15 MED ORDER — ACETAMINOPHEN 325 MG PO TABS
650.0000 mg | ORAL_TABLET | Freq: Four times a day (QID) | ORAL | Status: DC | PRN
  Administered 2019-01-17 (×2): 650 mg via ORAL
  Filled 2019-01-15 (×2): qty 2

## 2019-01-15 MED ORDER — SODIUM CHLORIDE 0.9 % IV SOLN
2.0000 g | Freq: Once | INTRAVENOUS | Status: AC
  Administered 2019-01-15: 2 g via INTRAVENOUS
  Filled 2019-01-15: qty 2

## 2019-01-15 MED ORDER — TAMSULOSIN HCL 0.4 MG PO CAPS
0.4000 mg | ORAL_CAPSULE | Freq: Every day | ORAL | Status: DC
  Administered 2019-01-17 – 2019-01-22 (×6): 0.4 mg via ORAL
  Filled 2019-01-15 (×6): qty 1

## 2019-01-15 MED ORDER — MYCOPHENOLATE SODIUM 180 MG PO TBEC
180.0000 mg | DELAYED_RELEASE_TABLET | Freq: Two times a day (BID) | ORAL | Status: DC
  Filled 2019-01-15: qty 1

## 2019-01-15 MED ORDER — CYCLOSPORINE MODIFIED (NEORAL) 25 MG PO CAPS
125.0000 mg | ORAL_CAPSULE | Freq: Two times a day (BID) | ORAL | Status: DC
  Administered 2019-01-15 – 2019-01-18 (×8): 125 mg via ORAL
  Administered 2019-01-19: 50 mg via ORAL
  Filled 2019-01-15 (×11): qty 5

## 2019-01-15 MED ORDER — METRONIDAZOLE IN NACL 5-0.79 MG/ML-% IV SOLN
500.0000 mg | Freq: Once | INTRAVENOUS | Status: AC
  Administered 2019-01-15: 500 mg via INTRAVENOUS
  Filled 2019-01-15: qty 100

## 2019-01-15 MED ORDER — AZITHROMYCIN 500 MG PO TABS
250.0000 mg | ORAL_TABLET | Freq: Every day | ORAL | Status: AC
  Administered 2019-01-16 – 2019-01-19 (×4): 250 mg via ORAL
  Filled 2019-01-15 (×4): qty 1

## 2019-01-15 MED ORDER — HYDROCORTISONE NA SUCCINATE PF 100 MG IJ SOLR
50.0000 mg | Freq: Two times a day (BID) | INTRAMUSCULAR | Status: DC
  Administered 2019-01-15 – 2019-01-16 (×2): 50 mg via INTRAVENOUS
  Filled 2019-01-15 (×2): qty 2

## 2019-01-15 MED ORDER — DILTIAZEM HCL ER COATED BEADS 120 MG PO CP24: 120.0000 mg | ORAL_CAPSULE | Freq: Every day | ORAL | Status: DC

## 2019-01-15 MED ORDER — LACTATED RINGERS IV BOLUS
1000.0000 mL | Freq: Once | INTRAVENOUS | Status: AC
  Administered 2019-01-15: 1000 mL via INTRAVENOUS

## 2019-01-15 MED ORDER — FAMOTIDINE 20 MG PO TABS
20.0000 mg | ORAL_TABLET | Freq: Every day | ORAL | Status: DC
  Administered 2019-01-15 – 2019-01-23 (×9): 20 mg via ORAL
  Filled 2019-01-15 (×9): qty 1

## 2019-01-15 MED ORDER — PREDNISONE 5 MG PO TABS: 5.0000 mg | ORAL_TABLET | Freq: Every day | ORAL | Status: DC

## 2019-01-15 MED ORDER — ENSURE ENLIVE PO LIQD
237.0000 mL | Freq: Three times a day (TID) | ORAL | Status: DC
  Administered 2019-01-15 – 2019-01-22 (×14): 237 mL via ORAL
  Filled 2019-01-15 (×2): qty 237

## 2019-01-15 MED ORDER — CYCLOSPORINE MODIFIED (NEORAL) 100 MG PO CAPS
100.0000 mg | ORAL_CAPSULE | Freq: Two times a day (BID) | ORAL | Status: DC
  Filled 2019-01-15: qty 1

## 2019-01-15 MED ORDER — SODIUM ZIRCONIUM CYCLOSILICATE 5 G PO PACK
5.0000 g | PACK | Freq: Once | ORAL | Status: DC
  Filled 2019-01-15: qty 1

## 2019-01-15 MED ORDER — SODIUM CHLORIDE 0.9 % IV SOLN
500.0000 mg | Freq: Once | INTRAVENOUS | Status: AC
  Administered 2019-01-15: 500 mg via INTRAVENOUS
  Filled 2019-01-15: qty 500

## 2019-01-15 MED ORDER — CYCLOSPORINE MODIFIED (NEORAL) 100 MG/ML PO SOLN
125.0000 mg | Freq: Two times a day (BID) | ORAL | Status: DC
  Filled 2019-01-15 (×2): qty 1.3

## 2019-01-15 MED ORDER — SODIUM CHLORIDE 0.9 % IV BOLUS (SEPSIS)
1000.0000 mL | Freq: Once | INTRAVENOUS | Status: AC
  Administered 2019-01-15: 1000 mL via INTRAVENOUS

## 2019-01-15 MED ORDER — VANCOMYCIN HCL 10 G IV SOLR
1250.0000 mg | Freq: Once | INTRAVENOUS | Status: AC
  Administered 2019-01-15: 09:00:00 1250 mg via INTRAVENOUS
  Filled 2019-01-15: qty 1250

## 2019-01-15 MED ORDER — SODIUM CHLORIDE 0.9 % IV SOLN
INTRAVENOUS | Status: AC
  Administered 2019-01-15 – 2019-01-16 (×2): via INTRAVENOUS

## 2019-01-15 MED ORDER — DOCUSATE SODIUM 100 MG PO CAPS
200.0000 mg | ORAL_CAPSULE | Freq: Every day | ORAL | Status: DC
  Administered 2019-01-15 – 2019-01-23 (×9): 200 mg via ORAL
  Filled 2019-01-15 (×9): qty 2

## 2019-01-15 MED ORDER — ACETAMINOPHEN 650 MG RE SUPP: 650.0000 mg | Freq: Four times a day (QID) | RECTAL | Status: DC | PRN

## 2019-01-15 MED ORDER — PRO-STAT SUGAR FREE PO LIQD
30.0000 mL | Freq: Two times a day (BID) | ORAL | Status: DC
  Administered 2019-01-16: 30 mL via ORAL
  Filled 2019-01-15 (×2): qty 30

## 2019-01-15 MED ORDER — MEMANTINE HCL 10 MG PO TABS
10.0000 mg | ORAL_TABLET | Freq: Two times a day (BID) | ORAL | Status: DC
  Administered 2019-01-15 – 2019-01-23 (×16): 10 mg via ORAL
  Filled 2019-01-15 (×17): qty 1

## 2019-01-15 MED ORDER — ASPIRIN 81 MG PO CHEW
81.0000 mg | CHEWABLE_TABLET | Freq: Every day | ORAL | Status: DC
  Administered 2019-01-16 – 2019-01-22 (×7): 81 mg via ORAL
  Filled 2019-01-15 (×8): qty 1

## 2019-01-15 MED ORDER — ENOXAPARIN SODIUM 30 MG/0.3ML ~~LOC~~ SOLN
30.0000 mg | SUBCUTANEOUS | Status: DC
  Administered 2019-01-15 – 2019-01-23 (×9): 30 mg via SUBCUTANEOUS
  Filled 2019-01-15 (×9): qty 0.3

## 2019-01-15 NOTE — ED Provider Notes (Signed)
I received this patient in signout from Dr. Dina Rich. Briefly, he presented w/ AMS, weakness, and abdominal pain. Code sepsis activated w/ antibiotics and cultures. Thus far, w/u notable for UTI and R sided pneumonia. At time of signout, pending completion of labs and admission.  CMP notable for acute on chronic kidney failure w/ Cr 5.3, K 5.5, BUN 210, AG 19, trop 0.09. Added 3rd liter of fluid for borderline low BP but maintaining normal MAP. COVID-19 negative. Repeat lactate normal after resuscitation. Bladder scan shows 300cc, will order foley catheter as urinary retention may be contributing to ARF.  Discussed admission w/ internal med, Dr. Kristian Covey, and pt admitted for further care.   CRITICAL CARE Performed by: Wenda Overland Boris Engelmann   Total critical care time: 30 minutes  Critical care time was exclusive of separately billable procedures and treating other patients.  Critical care was necessary to treat or prevent imminent or life-threatening deterioration.  Critical care was time spent personally by me on the following activities: development of treatment plan with patient and/or surrogate as well as nursing, discussions with consultants, evaluation of patient's response to treatment, examination of patient, obtaining history from patient or surrogate, ordering and performing treatments and interventions, ordering and review of laboratory studies, ordering and review of radiographic studies, pulse oximetry and re-evaluation of patient's condition.    Anjeanette Petzold, Wenda Overland, MD 01/29/2019 [phone]

## 2019-01-15 NOTE — ED Notes (Signed)
Aristide Waggle pts daughter [phone] call w/ updates and any questions about pt

## 2019-01-15 NOTE — ED Triage Notes (Signed)
EMS stated patient hypotensive initially, 85/40

## 2019-01-15 NOTE — ED Provider Notes (Signed)
[facility] EMERGENCY DEPARTMENT Provider Note   CSN: [id number] Arrival date & time: 01/24/2019  0453    History   Chief Complaint Chief Complaint  Patient presents with  . Altered Mental Status  . Abdominal Pain    HPI [name] [name] is a 85 y.o. male.     HPI  This is an 85 year old male with a history of Alzheimer's, chronic kidney disease, retention, hyperlipidemia who presents by EMS with weakness and abdominal pain.  EMS reports that they were called out initially for chest pain but discovered that the patient was more having abdominal pain.  He was noted to be hypotensive 85/40.  Patient provides very minimal history.  He can tell me his name and follows simple commands.  I spoke with his daughter who states over the last 2 to 3 days he has had decreased oral intake.  She states that he has been more weak and over the last several hours more confused and less talkative.  She reports that he was complaining of chest pain and urinary symptoms.  No noted fevers at home or sick contacts.  Level 5 caveat.  Past Medical History:  Diagnosis Date  . A-fib ([place])   . Alzheimer disease ([place]) 10/31/2018  . Anemia in CKD (chronic kidney disease)   . Arthritis   . Bilateral hydrocele   . CKD (chronic kidney disease), stage II    nephrologist-  deterding  . Diverticulosis   . Dysuria   . H/O kidney transplant    right 1980/  left 1994 with right transplanted kidney removal  . Hematuria   . High cholesterol   . History of adenomatous polyp of colon    VILLOUS ADENOMA POLYP  . History of asbestos exposure   . History of condyloma acuminatum    genital  . 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 History of renal cell carcinoma    S/P  BILATERAL NEPHRECTOMY AND RENAL TRANSPLANTS  . History of small bowel obstruction    12/ 2006  due to adhesions -- resolved without surgerical intervention  . History of TIA (transient ischemic attack)    . Hyperlipidemia   . Hypertension   . Proteinuria   . Renal failure   . Secondary hyperparathyroidism, renal ([place])   . Wears glasses     Patient Active Problem List   Diagnosis Date Noted  . Alzheimer disease ([place]) 10/31/2018  . Retention of urine   . Acute lower UTI   . Hypoalbuminemia   . Dementia with behavioral disturbance ([place])   . Palliative care by specialist   . Advanced care planning/counseling discussion   . Goals of care, counseling/discussion   . Pressure injury of skin 10/01/2018  . ARF (acute renal failure) ([place]) 09/30/2018  . Bone lesion 03/27/2018  . AAA (abdominal aortic aneurysm) ([place]) 03/27/2018  . BPH (benign prostatic hyperplasia) 03/27/2018  . Weight loss 03/27/2018  . Hypotension 03/27/2018  . Unspecified atrial fibrillation ([place]) 03/27/2018  . Protein-calorie malnutrition, severe 03/27/2018  . SIRS (systemic inflammatory response syndrome) ([place]) 03/26/2018    Past Surgical History:  Procedure Laterality Date  . COLONOSCOPY  10/17/2012   Procedure: COLONOSCOPY;  Surgeon: Beryle Beams, MD;  Location: [facility];  Service: Endoscopy;  Laterality: N/A;  . CYSTOSCOPY N/A 07/13/2015   Procedure: CYSTOSCOPY;  Surgeon: Irine Seal, MD;  Location: [facility];  Service: Urology;  Laterality: N/A;  . HEMICOLECTOMY Right  1995   adenoma polyp  . HYDROCELE EXCISION Bilateral 07/13/2015   Procedure: RIGHT HYDROCELECTOMY, ADULT DRAINAGE OF LEFT HYDROCELE;  Surgeon: Irine Seal, MD;  Location: [facility];  Service: Urology;  Laterality: Bilateral;  . KIDNEY TRANSPLANT  right 1980; left 1994---  [facility]   right kidney removed 0737/  left kidney removed 1994        Home Medications    Prior to Admission medications   Medication Sig Start Date End Date Taking? Authorizing Provider  acetaminophen (TYLENOL) 500 MG tablet Take 500 mg by mouth daily as needed for fever or headache (pain).    [provider]  Amino  Acids-Protein Hydrolys (FEEDING SUPPLEMENT, PRO-STAT SUGAR FREE 64,) LIQD Take 30 mLs by mouth 2 (two) times daily between meals. 10/05/18   Bonnell Public, MD  aspirin 81 MG chewable tablet Chew 1 tablet (81 mg total) by mouth daily. 03/30/18   Regalado, Belkys A, MD  cycloSPORINE modified (NEORAL) 100 MG capsule Take 100 mg by mouth See admin instructions. Take one capsule (100 mg) by mouth twice daily with a 25 mg capsule for a total dose of 125 mg    [provider]  cycloSPORINE modified (NEORAL) 25 MG capsule Take 25 mg by mouth See admin instructions. Take one capsule (25 mg) by mouth twice daily with a 100 mg capsule for a total dose of 125 mg    [provider]  diltiazem (CARDIZEM CD) 180 MG 24 hr capsule Take 1 capsule (180 mg total) by mouth daily. Patient taking differently: Take 120 mg by mouth daily.  04/23/18   Lendon Colonel, NP  docusate sodium (COLACE) 100 MG capsule Take 2 capsules (200 mg total) by mouth daily as needed for mild constipation. 10/05/18   Bonnell Public, MD  famotidine (PEPCID) 20 MG tablet Take 1 tablet (20 mg total) by mouth at bedtime. 03/29/18   Regalado, Belkys A, MD  feeding supplement, ENSURE ENLIVE, (ENSURE ENLIVE) LIQD Take 237 mLs by mouth 3 (three) times daily between meals. Patient taking differently: Take 237 mLs by mouth 2 (two) times daily between meals.  03/29/18   Regalado, Jerald Kief A, MD  memantine (NAMENDA) 5 MG tablet 2 tablets twice daily 12/16/18   Kathrynn Ducking, MD  mycophenolate (MYFORTIC) 180 MG EC tablet Take 180 mg by mouth 2 (two) times daily.    [provider]  predniSONE (DELTASONE) 5 MG tablet Take 5 mg by mouth daily with breakfast.    [provider]  tamsulosin (FLOMAX) 0.4 MG CAPS capsule Take 1 capsule (0.4 mg total) by mouth daily after supper. 10/05/18   Bonnell Public, MD    Family History Family History  Problem Relation Age of Onset  . Hypertension Mother   . Cancer  Sister     Social History Social History   Tobacco Use  . Smoking status: Never Smoker  . Smokeless tobacco: Never Used  Substance Use Topics  . Alcohol use: No  . Drug use: No     Allergies   Ciprofloxacin; Clindamycin/lincomycin; Levofloxacin; and Septra [sulfamethoxazole-trimethoprim]   Review of Systems Review of Systems  Unable to perform ROS: Mental status change     Physical Exam Updated Vital Signs BP 103/75 (BP Location: Right Arm)   Pulse 69   Temp (!) 97.2 F (36.2 C) (Rectal)   Resp 17   Ht 1.753 m (5\' 9" )   Wt 60 kg   SpO2 100%   BMI  19.53 kg/m   Physical Exam Vitals signs and nursing note reviewed.  Constitutional:      General: He is not in acute distress.    Comments: Frail, cachectic  HENT:     Head: Normocephalic and atraumatic.     Mouth/Throat:     Comments: Mucous membranes dry Eyes:     Pupils: Pupils are equal, round, and reactive to light.  Neck:     Musculoskeletal: Neck supple.  Cardiovascular:     Rate and Rhythm: Normal rate and regular rhythm.     Heart sounds: Normal heart sounds. No murmur.  Pulmonary:     Effort: Pulmonary effort is normal. No respiratory distress.     Breath sounds: Normal breath sounds. No wheezing.  Abdominal:     General: Abdomen is scaphoid. A surgical scar is present. Bowel sounds are normal.     Palpations: Abdomen is soft.     Tenderness: There is no abdominal tenderness. There is no guarding or rebound.  Lymphadenopathy:     Cervical: No cervical adenopathy.  Skin:    General: Skin is warm and dry.  Neurological:     Mental Status: He is alert.     Comments: Oriented to himself, follows simple commands and does not appear to have any unilateral weakness      ED Treatments / Results  Labs (all labs ordered are listed, but only abnormal results are displayed) Labs Reviewed  LACTIC ACID, PLASMA - Abnormal; Notable for the following components:      Result Value   Lactic Acid, Venous  2.0 (*)    All other components within normal limits  CBC WITH DIFFERENTIAL/PLATELET - Abnormal; Notable for the following components:   WBC 13.0 (*)    RBC 2.84 (*)    Hemoglobin 8.3 (*)    HCT 24.8 (*)    RDW 18.2 (*)    Neutro Abs 12.1 (*)    Lymphs Abs 0.6 (*)    All other components within normal limits  URINALYSIS, ROUTINE W REFLEX MICROSCOPIC - Abnormal; Notable for the following components:   APPearance CLOUDY (*)    Hgb urine dipstick LARGE (*)    Protein, ur 30 (*)    Leukocytes,Ua LARGE (*)    WBC, UA >50 (*)    Bacteria, UA RARE (*)    All other components within normal limits  CULTURE, BLOOD (ROUTINE X 2)  CULTURE, BLOOD (ROUTINE X 2)  URINE CULTURE  SARS CORONAVIRUS 2 (HOSPITAL ORDER, [facility])  LACTIC ACID, PLASMA  COMPREHENSIVE METABOLIC PANEL  TROPONIN I  CBG MONITORING, ED    EKG EKG Interpretation  Date/Time:  Wednesday Jan 15 2019 05:08:41 EDT Ventricular Rate:  73 PR Interval:    QRS Duration: 88 QT Interval:  381 QTC Calculation: 420 R Axis:   -24 Text Interpretation:  Sinus rhythm Supraventricular bigeminy Anterior infarct, old Nonspecific T abnormalities, lateral leads Similar to prior Confirmed by Thayer Jew [phone]) on 01/28/2019 5:12:09 AM   Radiology Ct Head Wo Contrast  Result Date: 01/22/2019 CLINICAL DATA:  Altered mental status with unclear cause EXAM: CT HEAD WITHOUT CONTRAST TECHNIQUE: Contiguous axial images were obtained from the base of the skull through the vertex without intravenous contrast. COMPARISON:  11/07/2018 FINDINGS: Brain: No evidence of acute infarction, hemorrhage, hydrocephalus, extra-axial collection or mass lesion/mass effect. Generalized atrophy. Extensive chronic small vessel ischemic change in the cerebral white matter. 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 Small remote bilateral  cerebellar infarcts. Vascular: Atherosclerotic calcification Skull: Negative for  fracture or bone lesion. Sinuses/Orbits: Left cataract resection IMPRESSION: 1. No acute finding. 2. Atrophy and extensive chronic small vessel ischemia. Electronically Signed   By: Monte Fantasia M.D.   On: 01/23/2019 06:06   Dg Chest Port 1 View  Result Date: 02/01/2019 CLINICAL DATA:  Altered mental status and hypotension EXAM: PORTABLE CHEST 1 VIEW COMPARISON:  09/30/2018 FINDINGS: Background of emphysema. Extensive airspace disease in the right upper lobe. Lower lobe streaky opacities could be infection or scarring. Borderline heart size. Aortic tortuosity. No Kerley lines, effusion, or pneumothorax. Calcified pleural plaques. IMPRESSION: 1. Right upper lobe pneumonia. 2. Emphysema. Electronically Signed   By: Monte Fantasia M.D.   On: 01/29/2019 05:52    Procedures Procedures (including critical care time)  Medications Ordered in ED Medications  sodium chloride 0.9 % bolus 1,000 mL (0 mLs Intravenous Stopped 01/24/2019 0635)    And  sodium chloride 0.9 % bolus 1,000 mL (1,000 mLs Intravenous New Bag/Given 01/13/2019 0645)  metroNIDAZOLE (FLAGYL) IVPB 500 mg (500 mg Intravenous Bolus from Bag 01/10/2019 0615)  vancomycin (VANCOCIN) IVPB 1000 mg/200 mL premix (has no administration in time range)  ceFEPIme (MAXIPIME) 2 g in sodium chloride 0.9 % 100 mL IVPB (2 g Intravenous New Bag/Given (Non-Interop) 01/20/2019 3570)     Initial Impression / Assessment and Plan / ED Course  I have reviewed the triage vital signs and the nursing notes.  Pertinent labs & imaging results that were available during my care of the patient were reviewed by me and considered in my medical decision making (see chart for details).        Patient presents with generalized weakness.  Decreased oral intake over the last several days.  Reported chest pain and abdominal pain at home.  Hypotensive for EMS and initial blood pressure 103/75 for [place].  He is chronically ill-appearing and deconditioned.  He is not providing much in  the way of history.  I did have some collateral information from his daughter.  Sepsis work-up initiated.  Patient was started on fluids given report of hypotension and placed on broad-spectrum antibiotics.  Unknown source.  Rectal temperature is 97.2.  He does have a mild leukocytosis and a right upper lobe pneumonia on his chest x-ray.  CT scan is acute negative.  Additional lab work is pending.  Anticipate patient will need admission.  Final Clinical Impressions(s) / ED Diagnoses   Final diagnoses:  Community acquired pneumonia of right upper lobe of lung ([place])  Weakness    ED Discharge Orders    None       Horton, Barbette Hair, MD 02/03/2019 [phone]

## 2019-01-15 NOTE — Progress Notes (Signed)
Pharmacy Antibiotic Note  [name] [name] is a 85 y.o. male admitted on 01/18/2019 with sepsis.  Pharmacy has been consulted for vanc/cefepime dosing.  Presented with weakness, abdominal pain, and hypotension. CXR showing R upper lobe PNA. WBC 13, LA 2, temp 97.2. Scr 5.29 (CrCl 8.2 mL/min)- baseline Scr is ~2.   Plan: Cefepime 500 mg IV every 24 hours  Will order vancomycin 1250 mg IV once then monitor vancomycin random given acute on chronic kidney injury Monitor renal fx, clinical pic, cx results, and vanc levels as appropriate  Height: 5\' 9"  (175.3 cm) Weight: 132 lb 4.4 oz (60 kg) IBW/kg (Calculated) : 70.7  Temp (24hrs), Avg:97.2 F (36.2 C), Min:97.2 F (36.2 C), Max:97.2 F (36.2 C)  Recent Labs  Lab 01/27/2019 0541  WBC 13.0*  CREATININE 5.29*  LATICACIDVEN 2.0*    Estimated Creatinine Clearance: 8.2 mL/min (A) (by C-G formula based on SCr of 5.29 mg/dL (H)).    Allergies  Allergen Reactions  . Ciprofloxacin Rash  . Clindamycin/Lincomycin Rash  . Levofloxacin Rash  . Septra [Sulfamethoxazole-Trimethoprim] Rash    Antimicrobials this admission: Vanc 5/6 >>  Metronidazole 5/6 >>  Cefepime 5/6>>  Dose adjustments this admission: N/A  Microbiology results: Ucx 5/6: sent COVID 5/6: sent Bcx 5/6 x2: sent  Thank you for allowing pharmacy to be a part of this patient's care.  Antonietta Jewel, PharmD, [place] Clinical Pharmacist  Pager: [phone] Phone: [phone] 01/19/2019 7:46 AM

## 2019-01-15 NOTE — Consult Note (Signed)
[facility] Nephrology Consultation Note  Requesting MD: Dr Rebeca Alert Reason for consult: AKI, Kidney transplant  HPI:  [name] [name] is a 85 y.o. male with past medical history of hypertension, CAD, A. 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 Jimmy Footman at [facility], CKD stage III with baseline serum creatinine level around 1.8-2, renal cell carcinoma status post bilateral nephrectomies of his native kidneys, dementia and severe malnutrition, FTT, presented to the ER with abdominal pain, weakness, chest pain and shortness of breath.   He was admitted to the hospital in 09/2018 for AKI on CKD and infection.  In the ER patient was briefly hypotensive to 80/60.  Mild elevation in lactic acid level, elevated troponin, leukocytosis, chronic anemia, UA with UTI.  The labs showed potassium 5.5, creatinine 5.29 and BUN 210.  Chest x-ray with right upper lobe pneumonia.  We are consulted for the evaluation of worsening renal failure in a transplant patient. The kidney ultrasound showed no acute abnormalities of transplanted kidney in left lower quadrant.  It shows enlarged prostate.  The Foley catheter was placed. Patient was a started on broad-spectrum antibiotics with azithromycin, cefepime, vancomycin and Flagyl.  Patient received IV fluid bolus in ER.  Blood pressure improved after IV fluid.  Review of system limited.  Patient reports weakness and not feeling well.  He understands that he is in the hospital.  Creatinine, Ser  Date/Time Value Ref Range Status  01/20/2019 05:41 AM 5.29 (H) 0.61 - 1.24 mg/dL Final  10/05/2018 05:16 AM 2.15 (H) 0.61 - 1.24 mg/dL Final  10/04/2018 03:04 AM 2.01 (H) 0.61 - 1.24 mg/dL Final  10/03/2018 04:17 AM 1.94 (H) 0.61 - 1.24 mg/dL Final  10/02/2018 04:12 AM 2.01 (H) 0.61 - 1.24 mg/dL Final  10/01/2018 05:05 AM 2.21 (H) 0.61 - 1.24 mg/dL  Final  09/30/2018 03:00 PM 2.49 (H) 0.61 - 1.24 mg/dL Final  03/29/2018 02:54 AM 1.54 (H) 0.61 - 1.24 mg/dL Final  03/28/2018 09:01 AM 1.63 (H) 0.61 - 1.24 mg/dL Final  03/27/2018 05:25 AM 1.41 (H) 0.61 - 1.24 mg/dL Final  03/26/2018 04:16 PM 1.52 (H) 0.61 - 1.24 mg/dL Final     PMHx:   Past Medical History:  Diagnosis Date  . A-fib ([place])   . Alzheimer disease ([place]) 10/31/2018  . Anemia in CKD (chronic kidney disease)   . Arthritis   . Bilateral hydrocele   . CKD (chronic kidney disease), stage II    nephrologist-  deterding  . Diverticulosis   . Dysuria   . H/O kidney transplant    right 1980/  left 1994 with right transplanted kidney removal  . Hematuria   . High cholesterol   . History of adenomatous polyp of colon    VILLOUS ADENOMA POLYP  . History of asbestos exposure   . History of condyloma acuminatum    genital  . 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 History of renal cell carcinoma    S/P  BILATERAL NEPHRECTOMY AND RENAL TRANSPLANTS  . History of small bowel obstruction    12/ 2006  due to adhesions -- resolved without surgerical intervention  . History of TIA (transient ischemic attack)   . Hyperlipidemia   . Hypertension   . Proteinuria   . Renal failure   . Secondary hyperparathyroidism, renal ([place])   . Wears glasses  Past Surgical History:  Procedure Laterality Date  . COLONOSCOPY  10/17/2012   Procedure: COLONOSCOPY;  Surgeon: Beryle Beams, MD;  Location: [facility];  Service: Endoscopy;  Laterality: N/A;  . CYSTOSCOPY N/A 07/13/2015   Procedure: CYSTOSCOPY;  Surgeon: Irine Seal, MD;  Location: [facility];  Service: Urology;  Laterality: N/A;  . HEMICOLECTOMY Right 1995   adenoma polyp  . HYDROCELE EXCISION Bilateral 07/13/2015   Procedure: RIGHT HYDROCELECTOMY, ADULT DRAINAGE OF LEFT HYDROCELE;  Surgeon: Irine Seal, MD;  Location: [facility];  Service: Urology;  Laterality: Bilateral;   . KIDNEY TRANSPLANT  right 1980; left 1994---  [facility]   right kidney removed 1989/  left kidney removed 1994    Family Hx:  Family History  Problem Relation Age of Onset  . Hypertension Mother   . Cancer Sister     Social History:  reports that he has never smoked. He has never used smokeless tobacco. He reports that he does not drink alcohol or use drugs.  Allergies:  Allergies  Allergen Reactions  . Ciprofloxacin Rash  . Clindamycin/Lincomycin Rash  . Levofloxacin Rash  . Septra [Sulfamethoxazole-Trimethoprim] Rash    Medications: Prior to Admission medications   Medication Sig Start Date End Date Taking? Authorizing Provider  acetaminophen (TYLENOL) 500 MG tablet Take 500 mg by mouth daily as needed for fever or headache (pain).   Yes [provider]  Amino Acids-Protein Hydrolys (FEEDING SUPPLEMENT, PRO-STAT SUGAR FREE 64,) LIQD Take 30 mLs by mouth 2 (two) times daily between meals. 10/05/18  Yes Bonnell Public, MD  aspirin 81 MG chewable tablet Chew 1 tablet (81 mg total) by mouth daily. 03/30/18  Yes Regalado, Belkys A, MD  cycloSPORINE modified (NEORAL) 100 MG capsule Take 100 mg by mouth See admin instructions. Take one capsule (100 mg) by mouth twice daily with a 25 mg capsule for a total dose of 125 mg   Yes [provider]  cycloSPORINE modified (NEORAL) 25 MG capsule Take 25 mg by mouth See admin instructions. Take one capsule (25 mg) by mouth twice daily with a 100 mg capsule for a total dose of 125 mg   Yes [provider]  diltiazem (CARDIZEM CD) 180 MG 24 hr capsule Take 1 capsule (180 mg total) by mouth daily. Patient taking differently: Take 120 mg by mouth at bedtime.  04/23/18  Yes Lendon Colonel, NP  docusate sodium (COLACE) 100 MG capsule Take 2 capsules (200 mg total) by mouth daily as needed for mild constipation. 10/05/18  Yes Dana Allan I, MD  famotidine (PEPCID) 20 MG tablet Take 1 tablet (20 mg total) by mouth at  bedtime. 03/29/18  Yes Regalado, Belkys A, MD  feeding supplement, ENSURE ENLIVE, (ENSURE ENLIVE) LIQD Take 237 mLs by mouth 3 (three) times daily between meals. Patient taking differently: Take 237 mLs by mouth 2 (two) times daily between meals.  03/29/18  Yes Regalado, Belkys A, MD  furosemide (LASIX) 40 MG tablet Take 40 mg by mouth daily. 11/15/18  Yes [provider]  memantine (NAMENDA) 5 MG tablet 2 tablets twice daily Patient taking differently: Take 10 mg by mouth 2 (two) times daily.  12/16/18  Yes Kathrynn Ducking, MD  mycophenolate (MYFORTIC) 180 MG EC tablet Take 180 mg by mouth 2 (two) times daily.   Yes [provider]  predniSONE (DELTASONE) 5 MG tablet Take 5 mg by mouth daily with breakfast.   Yes [provider]  tamsulosin (  FLOMAX) 0.4 MG CAPS capsule Take 1 capsule (0.4 mg total) by mouth daily after supper. 10/05/18  Yes Bonnell Public, MD    I have reviewed the patient's current medications.  Labs:  Results for orders placed or performed during the hospital encounter of 02/03/2019 (from the past 48 hour(s))  CBG monitoring, ED     Status: None   Collection Time: 01/14/2019  5:05 AM  Result Value Ref Range   Glucose-Capillary 97 70 - 99 mg/dL  SARS Coronavirus 2 (CEPHEID - Performed in [facility] hospital lab), Hosp Order     Status: None   Collection Time: 02/01/2019  5:27 AM  Result Value Ref Range   SARS Coronavirus 2 NEGATIVE NEGATIVE    Comment: (NOTE) If result is NEGATIVE SARS-CoV-2 target nucleic acids are NOT DETECTED. The SARS-CoV-2 RNA is generally detectable in upper and lower  respiratory specimens during the acute phase of infection. The lowest  concentration of SARS-CoV-2 viral copies this assay can detect is 250  copies / mL. A negative result does not preclude SARS-CoV-2 infection  and should not be used as the sole basis for treatment or other  patient management decisions.  A negative result may occur with  improper specimen  collection / handling, submission of specimen other  than nasopharyngeal swab, presence of viral mutation(s) within the  areas targeted by this assay, and inadequate number of viral copies  (<250 copies / mL). A negative result must be combined with clinical  observations, patient history, and epidemiological information. If result is POSITIVE SARS-CoV-2 target nucleic acids are DETECTED. The SARS-CoV-2 RNA is generally detectable in upper and lower  respiratory specimens dur ing the acute phase of infection.  Positive  results are indicative of active infection with SARS-CoV-2.  Clinical  correlation with patient history and other diagnostic information is  necessary to determine patient infection status.  Positive results do  not rule out bacterial infection or co-infection with other viruses. If result is PRESUMPTIVE POSTIVE SARS-CoV-2 nucleic acids MAY BE PRESENT.   A presumptive positive result was obtained on the submitted specimen  and confirmed on repeat testing.  While 2019 novel coronavirus  (SARS-CoV-2) nucleic acids may be present in the submitted sample  additional confirmatory testing may be necessary for epidemiological  and / or clinical management purposes  to differentiate between  SARS-CoV-2 and other Sarbecovirus currently known to infect humans.  If clinically indicated additional testing with an alternate test  methodology [phone]) is advised. The SARS-CoV-2 RNA is generally  detectable in upper and lower respiratory sp ecimens during the acute  phase of infection. The expected result is Negative. Fact Sheet for Patients:  StrictlyIdeas.no Fact Sheet for Healthcare Providers: BankingDealers.co.za This test is not yet approved or cleared by the [place] FDA and has been authorized for detection and/or diagnosis of SARS-CoV-2 by FDA under an Emergency Use Authorization (EUA).  This EUA will remain in effect  (meaning this test can be used) for the duration of the COVID-19 declaration under Section 564(b)(1) of the Act, 21 U.S.C. section 360bbb-3(b)(1), unless the authorization is terminated or revoked sooner. Performed at [facility], [place] [address], [place], [place] [address]   Lactic acid, plasma     Status: Abnormal   Collection Time: 01/14/2019  5:41 AM  Result Value Ref Range   Lactic Acid, Venous 2.0 (HH) 0.5 - 1.9 mmol/L    Comment: CRITICAL RESULT CALLED TO, READ BACK BY AND VERIFIED WITH: RN W  WADE @0627  02/09/2019 BY S GEZAHEGN Performed at [facility], [place] [address], [place], [place] [address]   Comprehensive metabolic panel     Status: Abnormal   Collection Time: 02/05/2019  5:41 AM  Result Value Ref Range   Sodium 133 (L) 135 - 145 mmol/L   Potassium 5.5 (H) 3.5 - 5.1 mmol/L   Chloride 91 (L) 98 - 111 mmol/L   CO2 23 22 - 32 mmol/L   Glucose, Bld 130 (H) 70 - 99 mg/dL   BUN 210 (H) 8 - 23 mg/dL   Creatinine, Ser 5.29 (H) 0.61 - 1.24 mg/dL   Calcium 9.0 8.9 - 10.3 mg/dL   Total Protein 7.0 6.5 - 8.1 g/dL   Albumin 1.9 (L) 3.5 - 5.0 g/dL   AST 35 15 - 41 U/L   ALT 15 0 - 44 U/L   Alkaline Phosphatase 58 38 - 126 U/L   Total Bilirubin 1.2 0.3 - 1.2 mg/dL   GFR calc non Af Amer 9 (L) >60 mL/min   GFR calc Af Amer 10 (L) >60 mL/min   Anion gap 19 (H) 5 - 15    Comment: Performed at [facility], [place] [address], [place], [place] [address]  CBC WITH DIFFERENTIAL     Status: Abnormal   Collection Time: 02/03/2019  5:41 AM  Result Value Ref Range   WBC 13.0 (H) 4.0 - 10.5 K/uL   RBC 2.84 (L) 4.22 - 5.81 MIL/uL   Hemoglobin 8.3 (L) 13.0 - 17.0 g/dL   HCT 24.8 (L) 39.0 - 52.0 %   MCV 87.3 80.0 - 100.0 fL   MCH 29.2 26.0 - 34.0 pg   MCHC 33.5 30.0 - 36.0 g/dL   RDW 18.2 (H) 11.5 - 15.5 %   Platelets 205 150 - 400 K/uL   nRBC 0.0 0.0 - 0.2 %   Neutrophils Relative % 94 %   Neutro Abs 12.1 (H) 1.7 - 7.7 K/uL   Lymphocytes Relative 4 %   Lymphs Abs 0.6 (L) 0.7  - 4.0 K/uL   Monocytes Relative 2 %   Monocytes Absolute 0.3 0.1 - 1.0 K/uL   Eosinophils Relative 0 %   Eosinophils Absolute 0.0 0.0 - 0.5 K/uL   Basophils Relative 0 %   Basophils Absolute 0.0 0.0 - 0.1 K/uL   Immature Granulocytes 0 %   Abs Immature Granulocytes 0.05 0.00 - 0.07 K/uL    Comment: Performed at [facility], [address] [place], [place] [address]  Urinalysis, Routine w reflex microscopic     Status: Abnormal   Collection Time: 01/17/2019  5:41 AM  Result Value Ref Range   Color, Urine YELLOW YELLOW   APPearance CLOUDY (A) CLEAR   Specific Gravity, Urine 1.010 1.005 - 1.030   pH 6.0 5.0 - 8.0   Glucose, UA NEGATIVE NEGATIVE mg/dL   Hgb urine dipstick LARGE (A) NEGATIVE   Bilirubin Urine NEGATIVE NEGATIVE   Ketones, ur NEGATIVE NEGATIVE mg/dL   Protein, ur 30 (A) NEGATIVE mg/dL   Nitrite NEGATIVE NEGATIVE   Leukocytes,Ua LARGE (A) NEGATIVE   RBC / HPF 21-50 0 - 5 RBC/hpf   WBC, UA >50 (H) 0 - 5 WBC/hpf   Bacteria, UA RARE (A) NONE SEEN   WBC Clumps PRESENT    Hyaline Casts, UA PRESENT     Comment: Performed at [facility] [address], [place], [place] [address]  Troponin I - ONCE - STAT  Status: Abnormal   Collection Time: 01/19/2019  5:41 AM  Result Value Ref Range   Troponin I 0.09 (HH) <0.03 ng/mL    Comment: CRITICAL RESULT CALLED TO, READ BACK BY AND VERIFIED WITH: B.Ardine Bjork 4034 02/08/2019 CLARK,S Performed at [facility], [place] [address] [place], [place] [address]   Lactic acid, plasma     Status: None   Collection Time: 01/18/2019  7:26 AM  Result Value Ref Range   Lactic Acid, Venous 1.9 0.5 - 1.9 mmol/L    Comment: Performed at [facility] [address] [place], [place] [address]     ROS:  Pertinent items noted in HPI and remainder of comprehensive ROS otherwise negative.  Physical Exam: Vitals:   01/27/2019 0930 01/14/2019 1046  BP: 94/73 106/72  Pulse: (!) 59 (!) 58  Resp: 17 16  Temp:    SpO2: 100%  100%     General exam: Elderly cachectic male lying in bed with dry mucous membrane Respiratory system: No increased work of breathing, clear, no wheezing Cardiovascular system: S1 & S2 heard, RRR.  No pedal edema. Gastrointestinal system: Abdomen is nondistended, soft and nontender. Normal bowel sounds heard. Central nervous system: Alert awake and oriented to the hospital and his name only. Extremities: No edema, very decreased muscle mass Skin: No rashes, lesions or ulcers Psychiatry: Has dementia.  Assessment/Plan:  #Acute kidney injury on CKD stage 3 likely ATN in the setting of septic shock: -UA with UTI, blood pressure improved with IV bolus.  Ultrasound kidney with no obstruction or acute finding in transplant kidney. -Baseline creatinine around 1.8-2, now significantly elevated BUN level and creatinine of 5.29.  Continue Foley catheter.  He looks dry on physical exam therefore I will order IV fluid.  Hold Lasix.  Given his comorbidities including advanced dementia, malnutrition patient is not a candidate for dialysis. -Repeat BMP.  Strict ins and out -Avoid nephrotoxins including IV contrast, NSAIDs.  #Kidney transplant/immunosuppression status: -Given hypotensive episode I will order IV hydrocortisone.  Resume cyclosporine.  Holding Myfortic because of sepsis. -Check cyclosporine level in the morning.  #Septic shock due to UTI and pneumonia: On broad-spectrum antibiotics.  BP is better with IV fluid.  Hold antihypertensive medication.  Follow-up culture results.  #Hyperkalemia: Received IV fluid and medical treatment.  Repeat lab.  #Severe malnutrition/FTT/dementia: Recommend palliative care consult.  Encourage oral intake.  She needs DNR.  #Anemia due to chronic disease: Monitor CBC.  Transfuse as needed  I have called patient's daughter and discussed the worsening renal failure and his current clinical condition. I discussed with her that [name] [name] is not a candidate for  dialysis.   Thank you for the consult.  We will follow.  Cristal Howatt Tanna Furry 02/04/2019, 1:04 PM  [facility].

## 2019-01-15 NOTE — ED Triage Notes (Signed)
Arrived via EMS, for [facility], Weakness & Abdominal pain.  Last known well yesterday afternoon, with progressive changes.  Neuro exam unremarkable except for weekness

## 2019-01-15 NOTE — ED Notes (Signed)
ED TO INPATIENT HANDOFF REPORT  ED Nurse Name and Phone #:  Hassan Rowan [phone]  S Name/Age/Gender [name] [name] 85 y.o. male Room/Bed: 021C/021C  Code Status   Code Status: Prior  Home/SNF/Other Home Patient oriented to: self Is this baseline? abdominal  Triage Complete: Triage complete  Chief Complaint weakness; altered   Triage Note Arrived via EMS, for ALOC, Weakness & Abdominal pain.  Last known well yesterday afternoon, with progressive changes.  Neuro exam unremarkable except for weekness  EMS stated patient hypotensive initially, 85/40   Allergies Allergies  Allergen Reactions  . Ciprofloxacin Rash  . Clindamycin/Lincomycin Rash  . Levofloxacin Rash  . Septra [Sulfamethoxazole-Trimethoprim] Rash    Level of Care/Admitting Diagnosis ED Disposition    ED Disposition Condition Comment   Admit  Hospital Area: [facility] [[id number]]  Level of Care: Progressive [102]  Covid Evaluation: N/A  Diagnosis: Renal failure [[id number]]  Admitting Physician: Oda Kilts [[id number]]  Attending Physician: Oda Kilts [[id number]]  Estimated length of stay: past midnight tomorrow  Certification:: I certify this patient will need inpatient services for at least 2 midnights  PT Class (Do Not Modify): Inpatient [101]  PT Acc Code (Do Not Modify): Private [1]       B Medical/Surgery History Past Medical History:  Diagnosis Date  . A-fib ([place])   . Alzheimer disease ([place]) 10/31/2018  . Anemia in CKD (chronic kidney disease)   . Arthritis   . Bilateral hydrocele   . CKD (chronic kidney disease), stage II    nephrologist-  deterding  . Diverticulosis   . Dysuria   . H/O kidney transplant    right 1980/  left 1994 with right transplanted kidney removal  . Hematuria   . High cholesterol   . History of adenomatous polyp of colon    VILLOUS ADENOMA POLYP  . History of asbestos exposure   . History of condyloma acuminatum    genital  . History of end  stage renal disease    secondary to HTN and FSGS  . History of renal cell carcinoma    S/P  BILATERAL NEPHRECTOMY AND RENAL TRANSPLANTS  . History of small bowel obstruction    12/ 2006  due to adhesions -- resolved without surgerical intervention  . History of TIA (transient ischemic attack)   . Hyperlipidemia   . Hypertension   . Proteinuria   . Renal failure   . Secondary hyperparathyroidism, renal ([place])   . Wears glasses    Past Surgical History:  Procedure Laterality Date  . COLONOSCOPY  10/17/2012   Procedure: COLONOSCOPY;  Surgeon: Beryle Beams, MD;  Location: [facility];  Service: Endoscopy;  Laterality: N/A;  . CYSTOSCOPY N/A 07/13/2015   Procedure: CYSTOSCOPY;  Surgeon: Irine Seal, MD;  Location: [facility];  Service: Urology;  Laterality: N/A;  . HEMICOLECTOMY Right 1995   adenoma polyp  . HYDROCELE EXCISION Bilateral 07/13/2015   Procedure: RIGHT HYDROCELECTOMY, ADULT DRAINAGE OF LEFT HYDROCELE;  Surgeon: Irine Seal, MD;  Location: [facility];  Service: Urology;  Laterality: Bilateral;  . KIDNEY TRANSPLANT  right 1980; left 1994---  [facility]   right kidney removed 1989/  left kidney removed 1994     A IV Location/Drains/Wounds Patient Lines/Drains/Airways Status   Active Line/Drains/Airways    Name:   Placement date:   Placement time:   Site:   Days:   Peripheral IV 01/13/2019 Left Antecubital   01/28/2019    0435  Antecubital   less than 1   Peripheral IV 02/09/2019 Right Antecubital   01/30/2019    0614    Antecubital   less than 1   Open Drain 1 Right Other (Comment)    07/13/15    1136    Other (Comment)   1282   Urethral Catheter [place] RN Non-latex 16 Fr.   10/02/18    0123    Non-latex   105   Incision (Closed) 07/13/15 Scrotum Bilateral   07/13/15    1201     1282   Pressure Injury 09/30/18 Stage II -  Partial thickness loss of dermis presenting as a shallow open ulcer with a red, pink wound bed without slough.   09/30/18    1856      107          Intake/Output Last 24 hours  Intake/Output Summary (Last 24 hours) at 01/11/2019 7672 Last data filed at 01/27/2019 0940 Gross per 24 hour  Intake 3550.13 ml  Output -  Net 3550.13 ml    Labs/Imaging Results for orders placed or performed during the hospital encounter of 01/14/2019 (from the past 48 hour(s))  CBG monitoring, ED     Status: None   Collection Time: 02/06/2019  5:05 AM  Result Value Ref Range   Glucose-Capillary 97 70 - 99 mg/dL  SARS Coronavirus 2 (CEPHEID - Performed in [facility] hospital lab), Hosp Order     Status: None   Collection Time: 01/24/2019  5:27 AM  Result Value Ref Range   SARS Coronavirus 2 NEGATIVE NEGATIVE    Comment: (NOTE) If result is NEGATIVE SARS-CoV-2 target nucleic acids are NOT DETECTED. The SARS-CoV-2 RNA is generally detectable in upper and lower  respiratory specimens during the acute phase of infection. The lowest  concentration of SARS-CoV-2 viral copies this assay can detect is 250  copies / mL. A negative result does not preclude SARS-CoV-2 infection  and should not be used as the sole basis for treatment or other  patient management decisions.  A negative result may occur with  improper specimen collection / handling, submission of specimen other  than nasopharyngeal swab, presence of viral mutation(s) within the  areas targeted by this assay, and inadequate number of viral copies  (<250 copies / mL). A negative result must be combined with clinical  observations, patient history, and epidemiological information. If result is POSITIVE SARS-CoV-2 target nucleic acids are DETECTED. The SARS-CoV-2 RNA is generally detectable in upper and lower  respiratory specimens dur ing the acute phase of infection.  Positive  results are indicative of active infection with SARS-CoV-2.  Clinical  correlation with patient history and other diagnostic information is  necessary to determine patient infection status.  Positive results  do  not rule out bacterial infection or co-infection with other viruses. If result is PRESUMPTIVE POSTIVE SARS-CoV-2 nucleic acids MAY BE PRESENT.   A presumptive positive result was obtained on the submitted specimen  and confirmed on repeat testing.  While 2019 novel coronavirus  (SARS-CoV-2) nucleic acids may be present in the submitted sample  additional confirmatory testing may be necessary for epidemiological  and / or clinical management purposes  to differentiate between  SARS-CoV-2 and other Sarbecovirus currently known to infect humans.  If clinically indicated additional testing with an alternate test  methodology [phone]) is advised. The SARS-CoV-2 RNA is generally  detectable in upper and lower respiratory sp ecimens during the acute  phase of infection. The expected result is  Negative. Fact Sheet for Patients:  StrictlyIdeas.no Fact Sheet for Healthcare Providers: BankingDealers.co.za This test is not yet approved or cleared by the [place] FDA and has been authorized for detection and/or diagnosis of SARS-CoV-2 by FDA under an Emergency Use Authorization (EUA).  This EUA will remain in effect (meaning this test can be used) for the duration of the COVID-19 declaration under Section 564(b)(1) of the Act, 21 U.S.C. section 360bbb-3(b)(1), unless the authorization is terminated or revoked sooner. Performed at [facility], [place] [address] [place], [place] [address]   Lactic acid, plasma     Status: Abnormal   Collection Time: 01/24/2019  5:41 AM  Result Value Ref Range   Lactic Acid, Venous 2.0 (HH) 0.5 - 1.9 mmol/L    Comment: CRITICAL RESULT CALLED TO, READ BACK BY AND VERIFIED WITH: RN W WADE @0627  01/13/2019 BY S GEZAHEGN Performed at [facility], [place] [address] [place], [place] [address]   Comprehensive metabolic panel     Status: Abnormal   Collection Time: 02/05/2019  5:41 AM  Result Value Ref Range    Sodium 133 (L) 135 - 145 mmol/L   Potassium 5.5 (H) 3.5 - 5.1 mmol/L   Chloride 91 (L) 98 - 111 mmol/L   CO2 23 22 - 32 mmol/L   Glucose, Bld 130 (H) 70 - 99 mg/dL   BUN 210 (H) 8 - 23 mg/dL   Creatinine, Ser 5.29 (H) 0.61 - 1.24 mg/dL   Calcium 9.0 8.9 - 10.3 mg/dL   Total Protein 7.0 6.5 - 8.1 g/dL   Albumin 1.9 (L) 3.5 - 5.0 g/dL   AST 35 15 - 41 U/L   ALT 15 0 - 44 U/L   Alkaline Phosphatase 58 38 - 126 U/L   Total Bilirubin 1.2 0.3 - 1.2 mg/dL   GFR calc non Af Amer 9 (L) >60 mL/min   GFR calc Af Amer 10 (L) >60 mL/min   Anion gap 19 (H) 5 - 15    Comment: Performed at [facility], [place] [address], [place], [place] [address]  CBC WITH DIFFERENTIAL     Status: Abnormal   Collection Time: 01/22/2019  5:41 AM  Result Value Ref Range   WBC 13.0 (H) 4.0 - 10.5 K/uL   RBC 2.84 (L) 4.22 - 5.81 MIL/uL   Hemoglobin 8.3 (L) 13.0 - 17.0 g/dL   HCT 24.8 (L) 39.0 - 52.0 %   MCV 87.3 80.0 - 100.0 fL   MCH 29.2 26.0 - 34.0 pg   MCHC 33.5 30.0 - 36.0 g/dL   RDW 18.2 (H) 11.5 - 15.5 %   Platelets 205 150 - 400 K/uL   nRBC 0.0 0.0 - 0.2 %   Neutrophils Relative % 94 %   Neutro Abs 12.1 (H) 1.7 - 7.7 K/uL   Lymphocytes Relative 4 %   Lymphs Abs 0.6 (L) 0.7 - 4.0 K/uL   Monocytes Relative 2 %   Monocytes Absolute 0.3 0.1 - 1.0 K/uL   Eosinophils Relative 0 %   Eosinophils Absolute 0.0 0.0 - 0.5 K/uL   Basophils Relative 0 %   Basophils Absolute 0.0 0.0 - 0.1 K/uL   Immature Granulocytes 0 %   Abs Immature Granulocytes 0.05 0.00 - 0.07 K/uL    Comment: Performed at [facility], [address], [place], [place] [address]  Urinalysis, Routine w reflex microscopic     Status: Abnormal   Collection Time: 01/14/2019  5:41 AM  Result Value Ref Range   Color, Urine YELLOW YELLOW   APPearance CLOUDY (A) CLEAR   Specific Gravity, Urine 1.010 1.005 - 1.030   pH 6.0 5.0 - 8.0   Glucose, UA NEGATIVE NEGATIVE mg/dL   Hgb urine dipstick LARGE (A) NEGATIVE   Bilirubin Urine NEGATIVE  NEGATIVE   Ketones, ur NEGATIVE NEGATIVE mg/dL   Protein, ur 30 (A) NEGATIVE mg/dL   Nitrite NEGATIVE NEGATIVE   Leukocytes,Ua LARGE (A) NEGATIVE   RBC / HPF 21-50 0 - 5 RBC/hpf   WBC, UA >50 (H) 0 - 5 WBC/hpf   Bacteria, UA RARE (A) NONE SEEN   WBC Clumps PRESENT    Hyaline Casts, UA PRESENT     Comment: Performed at [facility] [address] [place], [place] [address]  Troponin I - ONCE - STAT     Status: Abnormal   Collection Time: 01/28/2019  5:41 AM  Result Value Ref Range   Troponin I 0.09 (HH) <0.03 ng/mL    Comment: CRITICAL RESULT CALLED TO, READ BACK BY AND VERIFIED WITH: B.Ardine Bjork 2633 02/01/2019 CLARK,S Performed at [facility], [place] [address], [place], [place] [address]   Lactic acid, plasma     Status: None   Collection Time: 02/07/2019  7:26 AM  Result Value Ref Range   Lactic Acid, Venous 1.9 0.5 - 1.9 mmol/L    Comment: Performed at [facility] [address] [place], [place] [address]   Ct Head Wo Contrast  Result Date: 01/23/2019 CLINICAL DATA:  Altered mental status with unclear cause EXAM: CT HEAD WITHOUT CONTRAST TECHNIQUE: Contiguous axial images were obtained from the base of the skull through the vertex without intravenous contrast. COMPARISON:  11/07/2018 FINDINGS: Brain: No evidence of acute infarction, hemorrhage, hydrocephalus, extra-axial collection or mass lesion/mass effect. Generalized atrophy. Extensive chronic small vessel ischemic change in the cerebral white matter. 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 Small remote bilateral cerebellar infarcts. Vascular: Atherosclerotic calcification Skull: Negative for fracture or bone lesion. Sinuses/Orbits: Left cataract resection IMPRESSION: 1. No acute finding. 2. Atrophy and extensive chronic small vessel ischemia. Electronically Signed   By: Monte Fantasia M.D.   On: 01/16/2019 06:06   Dg Chest Port 1 View  Result Date: 01/16/2019 CLINICAL DATA:  Altered mental  status and hypotension EXAM: PORTABLE CHEST 1 VIEW COMPARISON:  09/30/2018 FINDINGS: Background of emphysema. Extensive airspace disease in the right upper lobe. Lower lobe streaky opacities could be infection or scarring. Borderline heart size. Aortic tortuosity. No Kerley lines, effusion, or pneumothorax. Calcified pleural plaques. IMPRESSION: 1. Right upper lobe pneumonia. 2. Emphysema. Electronically Signed   By: Monte Fantasia M.D.   On: 01/24/2019 05:52    Pending Labs Unresulted Labs (From admission, onward)    Start     Ordered   01/16/19 0500  Vancomycin, random  Tomorrow morning,   R     01/23/2019 0752   01/10/2019 0526  Blood Culture (routine x 2)  BLOOD CULTURE X 2,   STAT     02/05/2019 0527   01/31/2019 0526  Urine culture  ONCE - STAT,   STAT     01/19/2019 0527          Vitals/Pain Today's Vitals   01/18/2019 0630 02/05/2019 0645 01/12/2019 0700 01/22/2019 0715  BP: 92/72 107/77 94/63 95/72   Pulse: 64 62 67 66  Resp: 16 20 20 16   Temp:      TempSrc:  SpO2: 100% 100% 100% 100%  Weight:      Height:        Isolation Precautions No active isolations  Medications Medications  vancomycin (VANCOCIN) 1,250 mg in sodium chloride 0.9 % 250 mL IVPB (1,250 mg Intravenous Incomplete 01/18/2019 0835)  ceFEPIme (MAXIPIME) 500 mg in dextrose 5 % 50 mL IVPB (has no administration in time range)  vancomycin variable dose per unstable renal function (pharmacist dosing) (has no administration in time range)  sodium chloride 0.9 % bolus 1,000 mL (0 mLs Intravenous Stopped 01/22/2019 0635)    And  sodium chloride 0.9 % bolus 1,000 mL (0 mLs Intravenous Stopped 02/06/2019 0715)  ceFEPIme (MAXIPIME) 2 g in sodium chloride 0.9 % 100 mL IVPB (0 g Intravenous Stopped 01/23/2019 0645)  metroNIDAZOLE (FLAGYL) IVPB 500 mg (0 mg Intravenous Stopped 01/28/2019 0715)  lactated ringers bolus 1,000 mL (0 mLs Intravenous Stopped 01/29/2019 0940)    Mobility non-ambulatory High fall risk   Focused  Assessments abdominal   R Recommendations: See Admitting Provider Note  Report given to:   Additional Notes:

## 2019-01-15 NOTE — ED Notes (Signed)
Attempted report 

## 2019-01-15 NOTE — H&P (Addendum)
Date: 02/07/2019               Patient Name:  [name] [name] MRN: [id number]  DOB: Jun 11, 1928 Age / Sex: 85 y.o., male   PCP: Mauricia Area, MD         Medical Service: Internal Medicine Teaching Service         Attending Physician: Dr. Oda Kilts, MD    First Contact: Dr. Annie Paras Pager: [phone]  Second Contact: Dr. Shan Levans Pager: [phone]       After Hours (After 5p/  First Contact Pager: [phone]  weekends / holidays): Second Contact Pager: [phone]   Chief Complaint: AMS, chest pain, SOB  History of Present Illness: Patient is a very pleasant 85 yo M with extensive past medical history of alzheimer's dementia, CKD IV s/p renal transplant in 1994 on chronic immunosuppression, CAD, atrial fibrillation, chronic anemia, BPH, urinary rentention, s/p cystolitholapaxy, right orchiectomy, reported history of prostatectomy (no records) and chronic hematuria presenting from home with multiple complaints of altered mental status, generalized weakness, dysuria, chest pain, and shortness of breath. Patient lives at home with his daughter. At baseline, he is alert and oriented and able to carry on conversation. He does experience daily episodes of transient confusion, most frequently in the morning after waking up. Daughter reports about three days ago he began complaining of burning with urination and abdominal pain. She noticed over the next few days he became more withdrawn and stopped talking. He would only respond to yes no questions. Last night, he started complaining of chest pain and developed shortness of breath. She says his breathing became very labored and she called 911 to bring him to the hospital for evaluation.   On the arrival to the ED, patient was afebrile with T 97.2 and hypotensive to 80/64 saturating 100% on RA. EKG showed NSR, no acute ischemia. Labs were significant for a lactic acid of 2.0. Troponin elevated ~ 0.09 (consistent with priors). CMP with hyperkalemia K  5.5, and acute renal failure Scr 5.29 from baseline ~ 2.0. CBC with leukocytosis of 13, acute on chronic normocytic anemia with hemoglobin 8.3 (baseline 10). Urinalysis showed large hematuria, small proteinuria, large leukocytes, >50 wbc, and rare bacteria. CXR demonstrated changes consistent with emphysema and a right upper lobe consolidation. Blood cultures and urine cultures were collected and he was started on IV vancomycin, cefepime, and metronidazole. BP improved after 3L NS.   Meds:  Current Meds  Medication Sig   acetaminophen (TYLENOL) 500 MG tablet Take 500 mg by mouth daily as needed for fever or headache (pain).   Amino Acids-Protein Hydrolys (FEEDING SUPPLEMENT, PRO-STAT SUGAR FREE 64,) LIQD Take 30 mLs by mouth 2 (two) times daily between meals.   aspirin 81 MG chewable tablet Chew 1 tablet (81 mg total) by mouth daily.   cycloSPORINE modified (NEORAL) 100 MG capsule Take 100 mg by mouth See admin instructions. Take one capsule (100 mg) by mouth twice daily with a 25 mg capsule for a total dose of 125 mg   cycloSPORINE modified (NEORAL) 25 MG capsule Take 25 mg by mouth See admin instructions. Take one capsule (25 mg) by mouth twice daily with a 100 mg capsule for a total dose of 125 mg   diltiazem (CARDIZEM CD) 180 MG 24 hr capsule Take 1 capsule (180 mg total) by mouth daily. (Patient taking differently: Take 120 mg by mouth at bedtime. )   docusate sodium (COLACE) 100 MG capsule Take 2 capsules (200  mg total) by mouth daily as needed for mild constipation.   famotidine (PEPCID) 20 MG tablet Take 1 tablet (20 mg total) by mouth at bedtime.   feeding supplement, ENSURE ENLIVE, (ENSURE ENLIVE) LIQD Take 237 mLs by mouth 3 (three) times daily between meals. (Patient taking differently: Take 237 mLs by mouth 2 (two) times daily between meals. )   furosemide (LASIX) 40 MG tablet Take 40 mg by mouth daily.   memantine (NAMENDA) 5 MG tablet 2 tablets twice daily (Patient taking  differently: Take 10 mg by mouth 2 (two) times daily. )   mycophenolate (MYFORTIC) 180 MG EC tablet Take 180 mg by mouth 2 (two) times daily.   predniSONE (DELTASONE) 5 MG tablet Take 5 mg by mouth daily with breakfast.   tamsulosin (FLOMAX) 0.4 MG CAPS capsule Take 1 capsule (0.4 mg total) by mouth daily after supper.     Allergies: Allergies as of 01/24/2019 - Review Complete 01/22/2019  Allergen Reaction Noted   Ciprofloxacin Rash 10/17/2012   Clindamycin/lincomycin Rash 10/17/2012   Levofloxacin Rash 09/25/2012   Septra [sulfamethoxazole-trimethoprim] Rash 10/17/2012   Past Medical History:  Diagnosis Date   A-fib ([place])    Alzheimer disease ([place]) 10/31/2018   Anemia in CKD (chronic kidney disease)    Arthritis    Bilateral hydrocele    CKD (chronic kidney disease), stage II    nephrologist-  deterding   Diverticulosis    Dysuria    H/O kidney transplant    right 1980/  left 1994 with right transplanted kidney removal   Hematuria    High cholesterol    History of adenomatous polyp of colon    VILLOUS ADENOMA POLYP   History of asbestos exposure    History of condyloma acuminatum    genital   History 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 At baseline A&O and able to carry on a conversation. He does have daily episodes of transient confusion. He is a never smoker, never drinker, and denies  illicit substance use.   Review of Systems: A complete ROS was negative except as per HPI.   Physical Exam: Blood pressure 106/72, pulse (!) 58, temperature (!) 97.2 F (36.2 C), temperature source Rectal, resp. rate 16, height 5\' 9"  (1.753 m), weight 51.3 kg, SpO2 100 %. Constitutional: NAD, severely cachetic  HEENT: Temporal wasting, PERRL, anicteric sclera.  Cardiovascular: RRR, no murmurs, rubs, or gallops.  Pulmonary/Chest: CTAB, no wheezes, rales, or rhonchi. Normal work of breathing. Abdominal: Soft, non tender, non distended. +BS.  GU: foley in place Extremities: Warm and well perfused. No edema.  Neurological: Alert to person only, answers yes no questions.  Skin: No rashes or erythema  Psychiatric: Normal mood and affect  EKG: personally reviewed my interpretation is: NSR, supraventricular bigeminy   CXR: personally reviewed my interpretation  is: Hyperinflation, chronic bilateral airspace disease with new right upper lobe opacification   Assessment & Plan by Problem: Active Problems:   Renal failure  Sepsis Right Upper Lobe Pneumonia UTI Patient presented with AMS found to be septic from UTI and RUL in the setting of underlying lung disease and chronic immunosuppression for renal transplant. CXR and prior CT chest with extensive emphysematous changes. Patient is a never smoker, he has never had PFTs. He was seen by pulmonology in august of 2019. There was some concern for atypical infections vs. Chronic inflammatory lung disease given his immunocompromised state. Plan was for repeat CT chest in 3 months which was done 10/01/2018. Findings demonstrated pleural disease consistent with asbestos, diffuse bronchial wall thickening with emphysema, and non specific peribronchovascular ground glass attenuation within the bilateral upper lobes; stable compared to prior imaging. I do not see that he has followed up with pulmonology since. He is stable on RA.  -- BP improved s/p 3L NS  bolus in ED; hold further fluids for now -- Holding diltiazem with hypotension and bradycardia -- Continue cefepime, discontinue vancomycin and metronidazole  -- Add azithromycin for atypical coverage  -- F/u urine and blood cultures  -- Low threshold for stress dose steroids if recurrent hypotension  -- Will need pulmonology follow up    Acute on Chronic Renal Failure CKD Stage IV (basline Scr 1.5-2.0) Hx Renal Transplant 1994 Hx BPH, urinary retention, s/p cystolitholapaxy and right orchiectomy per prior urology note. Daughter reports history of prostatectomy.  -- Nephrology consult  -- F/u Renal ultrasound  -- Hold lasix  -- S/p 3L NS in ED -- Bladder scan in ED with 300cc; foley catheter placed -- Strict I&Os, daily labs  -- Continue cefepime, follow up urine cultures  -- Hold home mycophenolate, cyclosporine; will discuss with nephrology  -- Continue prednisone 5 mg daily   Hyperkalemia: Mild, 5.5 in the setting of renal failure. Holding lasix.  -- Lokelma 5 mg x 1 -- AM BMP  Acute on Chronic Normocytic Anemia:  Chronic Hematuria: Has seen urology for this and undergone cystoscopy. Last PSA was normal 2.22.  With guaiac positive stools during last admission in January requiring blood transfusion. Was on eliquis for atrial fibrillation but appears this was discontinued.  GI was consulted last admission. Endoscopy was deferred. Daughter declined invasive procedures given overall goals of care.  -- Follow CBC   AMS: Likely hypoactive delirium in the setting of UTI, pneumonia, and renal failure.  -- Monitor -- NPO, sips with meds -- SLP eval for diet   Alzheimer's Dementia: Daughter reports at baseline he is alert, oriented, and conversational. He does experience almost daily episodes of transient confusion, especially in the morning after waking up.  -- Continue home memantine   Hx Atrial Fibrillation: Diagnosed 1 year ago in the setting of hospitalization for sepsis. Started  on diltiazem and eliquis. Eliquis was discontinued in January after admission for acute renal failure and GI bleed requiring transfusion. Currently in normal sinus rhythm. Diltiazem was recently decreased 180 -> 120 in February due to side effect of hypotension. Bradycardic and hypotensive on admission.  -- Holding diltiazem   Hx CAD: Three vessel disease seen on prior CT chest. I do not see that he has ever had an ischemic evaluation. Troponin mildly elevated here 0.09 in the setting of sepsis and hypotension. Suspect demand ischemic. EKG non ischemic. Patient did complain of chest pain per daughter however I suspect this is due to PNA.  --  Continue ASA 81 daily  -- Trend troponin   Goals of Care: Palliative care consulted during last admission. Patient is DNR. Daughter updated via telephone.   FEN: S/p 3L NS bolus, lokelma for hyperK, NPO sips with meds VTE ppx: Lovenox (renally dosed) Code Status: DNR  Dispo: Admit patient to Inpatient with expected length of stay greater than 2 midnights.  SignedVelna Ochs, MD 01/12/2019, 10:57 AM  Pager: [phone]

## 2019-01-15 NOTE — Progress Notes (Signed)
SLP Cancellation Note  Patient Details Name: [name] [name] MRN: [id number] DOB: [birth year]   Cancelled treatment:       Reason Eval/Treat Not Completed: Fatigue/lethargy limiting ability to participate. Checked in with nursing staff - pt not alert enough for swallow evaluation this afternoon. Will f/u as able for PO trials.   Venita Sheffield Dalon Reichart 01/14/2019, 2:47 PM  Pollyann Glen, M.A. [place] Acute Environmental education officer [phone] Office [phone]

## 2019-01-16 DIAGNOSIS — Z7982 Long term (current) use of aspirin: Secondary | ICD-10-CM

## 2019-01-16 LAB — BASIC METABOLIC PANEL
Anion gap: 18 — ABNORMAL HIGH (ref 5–15)
BUN: 181 mg/dL — ABNORMAL HIGH (ref 8–23)
CO2: 21 mmol/L — ABNORMAL LOW (ref 22–32)
Calcium: 8.8 mg/dL — ABNORMAL LOW (ref 8.9–10.3)
Chloride: 99 mmol/L (ref 98–111)
Creatinine, Ser: 4.7 mg/dL — ABNORMAL HIGH (ref 0.61–1.24)
GFR calc Af Amer: 12 mL/min — ABNORMAL LOW (ref >60)
GFR calc non Af Amer: 10 mL/min — ABNORMAL LOW (ref >60)
Glucose, Bld: 104 mg/dL — ABNORMAL HIGH (ref 70–99)
Potassium: 4.7 mmol/L (ref 3.5–5.1)
Sodium: 138 mmol/L (ref 135–145)

## 2019-01-16 LAB — CBC
HCT: 22.9 % — ABNORMAL LOW (ref 39.0–52.0)
Hemoglobin: 7.6 g/dL — ABNORMAL LOW (ref 13.0–17.0)
MCH: 28.9 pg (ref 26.0–34.0)
MCHC: 33.2 g/dL (ref 30.0–36.0)
MCV: 87.1 fL (ref 80.0–100.0)
Platelets: 181 10*3/uL (ref 150–400)
RBC: 2.63 MIL/uL — ABNORMAL LOW (ref 4.22–5.81)
RDW: 18.3 % — ABNORMAL HIGH (ref 11.5–15.5)
WBC: 12.5 10*3/uL — ABNORMAL HIGH (ref 4.0–10.5)
nRBC: 0 % (ref 0.0–0.2)

## 2019-01-16 LAB — VANCOMYCIN, RANDOM: Vancomycin Rm: 11

## 2019-01-16 MED ORDER — SODIUM CHLORIDE 0.9 % IV SOLN
INTRAVENOUS | Status: DC
  Administered 2019-01-17: 01:00:00 via INTRAVENOUS

## 2019-01-16 MED ORDER — PREDNISONE 5 MG PO TABS
5.0000 mg | ORAL_TABLET | Freq: Every day | ORAL | Status: DC
  Administered 2019-01-17 – 2019-01-18 (×2): 5 mg via ORAL
  Filled 2019-01-16 (×2): qty 1

## 2019-01-16 MED ORDER — ADULT MULTIVITAMIN W/MINERALS CH
1.0000 | ORAL_TABLET | Freq: Every day | ORAL | Status: DC
  Administered 2019-01-17 – 2019-01-22 (×5): 1 via ORAL
  Filled 2019-01-16 (×7): qty 1

## 2019-01-16 NOTE — Evaluation (Signed)
Clinical/Bedside Swallow Evaluation Patient Details  Name: [name] [name] MRN: [id number] Date of Birth: [birth year]  Today's Date: 01/16/2019 Time: SLP Start Time (ACUTE ONLY): 1345 SLP Stop Time (ACUTE ONLY): 1400 SLP Time Calculation (min) (ACUTE ONLY): 15 min  Past Medical History:  Past Medical History:  Diagnosis Date  . A-fib ([place])   . Alzheimer disease ([place]) 10/31/2018  . Anemia in CKD (chronic kidney disease)   . Arthritis   . Bilateral hydrocele   . CKD (chronic kidney disease), stage II    nephrologist-  deterding  . Diverticulosis   . Dysuria   . H/O kidney transplant    right 1980/  left 1994 with right transplanted kidney removal  . Hematuria   . High cholesterol   . History of adenomatous polyp of colon    VILLOUS ADENOMA POLYP  . History of asbestos exposure   . History of condyloma acuminatum    genital  . 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 History of renal cell carcinoma    S/P  BILATERAL NEPHRECTOMY AND RENAL TRANSPLANTS  . History of small bowel obstruction    12/ 2006  due to adhesions -- resolved without surgerical intervention  . History of TIA (transient ischemic attack)   . Hyperlipidemia   . Hypertension   . Proteinuria   . Renal failure   . Secondary hyperparathyroidism, renal ([place])   . Wears glasses    Past Surgical History:  Past Surgical History:  Procedure Laterality Date  . COLONOSCOPY  10/17/2012   Procedure: COLONOSCOPY;  Surgeon: Beryle Beams, MD;  Location: [facility];  Service: Endoscopy;  Laterality: N/A;  . CYSTOSCOPY N/A 07/13/2015   Procedure: CYSTOSCOPY;  Surgeon: Irine Seal, MD;  Location: [facility];  Service: Urology;  Laterality: N/A;  . HEMICOLECTOMY Right 1995   adenoma polyp  . HYDROCELE EXCISION Bilateral 07/13/2015   Procedure: RIGHT HYDROCELECTOMY, ADULT DRAINAGE OF LEFT HYDROCELE;  Surgeon: Irine Seal, MD;  Location: [facility];  Service: Urology;   Laterality: Bilateral;  . KIDNEY TRANSPLANT  right 1980; left 1994---  [facility]   right kidney removed 1989/  left kidney removed [id number]   HPI:  85 year old man with a history of renal transplant 1994 on immunosuppression and with CKD stage IV as well as dementia admitted with sepsis.  Highest concern is for pyelonephritis of his graft kidney, also evidence on CXR of pneumonia. Dysmotility on esophagram in 2019.    Assessment / Plan / Recommendation Clinical Impression  Pt demonstrates concerning signs of significant pharyngeal and esophageal dysphagia. Each sip elicits multiple effortful swallows with audible swishing of liquids followed by hard wet coughing suggesting difficult passage of bolus into the esophagus with subsequent aspiration. Puree is tolerated with less coughing, but still signs of dysphagia. Pt requests sips after bites. Given prior dx of dysmotility in 2019, strongly suspect a primary esopahgeal dysphagia with backflow and probable aspiration events. RN confirms silimiar observations when giveing pt his necessary medications. Severity of deficits, pts debility and poor rehabilition potential suggest need for discussion of plan of care. Recommend a puree diet and thin liquids with known risk of aspiration for pts comfort. Will defer to medical team for discussion with family and diet initiation based on their wishes.  SLP Visit Diagnosis: Dysphagia, pharyngoesophageal phase (R13.14)    Aspiration Risk  Severe aspiration risk;Risk for inadequate nutrition/hydration    Diet Recommendation Dysphagia 1 (Puree);Thin liquid  Liquid Administration via: Cup;Straw Medication Administration: Crushed with puree Supervision: Full supervision/cueing for compensatory strategies Compensations: Slow rate;Small sips/bites;Follow solids with liquid Postural Changes: Seated upright at 90 degrees;Remain upright for at least 30 minutes after po intake    Other  Recommendations Oral Care  Recommendations: Oral care BID Other Recommendations: Have oral suction available   Follow up Recommendations 24 hour supervision/assistance      Frequency and Duration            Prognosis Prognosis for Safe Diet Advancement: Guarded Barriers to Reach Goals: Severity of deficits      Swallow Study   General HPI: 85 year old man with a history of renal transplant 1994 on immunosuppression and with CKD stage IV as well as dementia admitted with sepsis.  Highest concern is for pyelonephritis of his graft kidney, also evidence on CXR of pneumonia. Dysmotility on esophagram in 2019.  Type of Study: Bedside Swallow Evaluation Diet Prior to this Study: NPO Temperature Spikes Noted: No Respiratory Status: Room air History of Recent Intubation: No Behavior/Cognition: Alert;Cooperative;Pleasant mood Oral Cavity Assessment: Dry Oral Care Completed by SLP: No Oral Cavity - Dentition: Edentulous Self-Feeding Abilities: Needs assist Patient Positioning: Upright in bed Baseline Vocal Quality: Low vocal intensity Volitional Cough: Strong Volitional Swallow: Able to elicit    Oral/Motor/Sensory Function Overall Oral Motor/Sensory Function: Generalized oral weakness   Ice Chips     Thin Liquid Thin Liquid: Impaired Presentation: Cup;Straw Pharyngeal  Phase Impairments: Multiple swallows;Wet Vocal Quality;Cough - Immediate    Nectar Thick Nectar Thick Liquid: Not tested   Honey Thick Honey Thick Liquid: Not tested   Puree Puree: Impaired Presentation: Spoon Pharyngeal Phase Impairments: Multiple swallows;Wet Vocal Quality;Throat Clearing - Immediate   Solid     Solid: Not tested     Herbie Baltimore, MA CCC-SLP  Acute Rehabilitation Services Pager [phone] Office [phone]  Lynann Beaver 01/16/2019,2:24 PM

## 2019-01-16 NOTE — Progress Notes (Signed)
Subjective: No overnight events. Per RN, his mental status fluctuates. Sometimes he easily takes pills with applesauce, other times he is less alert. This morning, he is oriented to person, but thinks he's at home. He is having some pain at his IV site on the right arm, but denies pain elsewhere. He states he does not have an appetite right now. Updated the patient's daughter via telephone. All questions addressed.   Objective:  Vital signs in last 24 hours: Vitals:   01/31/2019 1046 01/29/2019 1320 02/06/2019 1739 01/22/2019 2100  BP: 106/72     Pulse: (!) 58     Resp: 16     Temp:  (!) 93.9 F (34.4 C) 98.2 F (36.8 C) 97.8 F (36.6 C)  TempSrc:  Rectal Rectal   SpO2: 100%     Weight: 51.3 kg     Height: 5\' 9"  (1.753 m)      Gen: awake, oriented to person only, no distress CV: RRR, no murmurs, no JVD Pulm: Crackles in the right upper lung, no basilar crackles  Abd: BS+, soft, non-distended, non-tender Ext: no edema  Assessment/Plan:  Principal Problem:   Sepsis ([place]) Active Problems:   BPH (benign prostatic hyperplasia)   Dementia with behavioral disturbance (H[facility])   Renal failure   Pyelonephritis of transplanted kidney   CAP (community acquired pneumonia)  Mr. Lovering is an 85 yo man with a medical history of alzheimer's dementia, CKD IV s/p renal transplant in 1994 on chronic immunosuppression, CAD, atrial fibrillation, chronic anemia, BPH, urinary rentention, s/p cystolitholapaxy, right orchiectomy, prostatectomy, and chronic hematuria who presented with sepsis in the setting of UTI and RUL pneumonia. He was hypothermic, hypotensive, and bradycardic on admission, but he is now hemodynamically stable.   Sepsis RUL pneumonia UTI Temperatures have improved to normal. Blood pressures in the 798X systolic on IVF and stress dose steroids. White count still elevated at 12.5. AMS appears improved although his mentation fluctuates per nursing. He was awake and alert enough to answer  questions appropriately and follow commands. Urine growing GNRs. Blood cx without growth for 24 hrs. - F/u blood cx and urine cx speciation - Continue cefepime and azithromycin (day 2) - Continue stress dose steroids  Acute on chronic renal failure Hx renal transplant 1994 Hx BPH and urinary retention  Baseline Cr 1.5-2.0. Cr 4.88 on admission. Remains elevated at 4.7 today. Likely ATN in the setting of sepsis and hypotension. Urinating appropriately with foley in place. He is not a candidate for HD per nephro. He does not appear volume overloaded today, but will monitor closely while on IVF and holding lasix.  - Nephrology following, appreciate recs - Continue cyclosporine, f/u cyclosporine level - Hold home mycophenolate and prednisone  - Continue home flomax - Hold home lasix - Continue IVF  - Trend renal function  Afib Rate-controlled with diltiazem. Not on anticoagulation 2/2 prior GI bleed. HR 70-80s. - Holding diltiazem for hypotension  Hyperkalemia: 5.5 on admission in the setting of renal failure. Improved to 4.7 after one dose of lokelma. - Trend BMP  Acute on chronic normocytic anemia: Likely secondary to CKD, chronic hematuria without definite etiology despite cystoscopy and urological evaluation, and iron deficiency (iron level was 15 five months ago). Hb 8.3 on admission, 7.6 today.  - Trend Hb. Transfuse if Hb <7 - No iron therapy at this time in the setting of active infection  CAD: Troponin mildly elevated, likely demand ischemia in the setting of sepsis. No suspicion of acute ischemia. -  Continue home aspirin  Alzheimer's dementia: continue home memantine  GOC: Patient is DNR  Dispo: Anticipated discharge in approximately 2-3 days.  Abednego Yeates, Andree Elk, MD 01/16/2019, 6:34 AM Pager: [phone]

## 2019-01-16 NOTE — Progress Notes (Signed)
PT Cancellation Note  Patient Details Name: [name] [name] MRN: [id number] DOB: May 18, 1928   Cancelled Treatment:    Reason Eval/Treat Not Completed: Active bedrest order Pt with strict bed rest orders. Will follow up as activity orders updated and as schedule allows.   [name] [name], PT, DPT  Acute Rehabilitation Services  Pager: [phone] Office: [phone]    [name] 01/16/2019, 1:12 PM

## 2019-01-16 NOTE — Progress Notes (Signed)
  Date: 01/16/2019  Patient name: [name] [name]  Medical record number: [id number]  Date of birth: 1927/10/03   I have seen and evaluated this patient and I have discussed the plan of care with the house staff. Please see their note for complete details. I concur with their findings with the following additions/corrections:   Stabilized and improved with antibiotics and IV hydration as well as hydrocortisone stress dose steroids given his chronic prednisone use.  Urine culture now growing gram-negative rods, awaiting speciation and sensitivities.  BUN and creatinine improved somewhat today with IV hydration, will continue IV fluids today. -Continue cefepime and azithromycin for coverage of graft pyelonephritis and CAP - Continue cyclosporine, hold mycophenolate - Continue gentle IV hydration -Strict I/O, hopefully will have some recovery from likely ATN  Lenice Pressman, M.D., Ph.D. 01/16/2019, 2:15 PM

## 2019-01-16 NOTE — Progress Notes (Addendum)
Initial Nutrition Assessment  RD working remotely.  DOCUMENTATION CODES:   Underweight  INTERVENTION:   -RD will follow for diet advancement and supplement as appropriate -MVI with minerals daily  NUTRITION DIAGNOSIS:   Predicted suboptimal nutrient intake related to chronic illness(dementia) as evidenced by percent weight loss.  GOAL:   Patient will meet greater than or equal to 90% of their needs  MONITOR:   PO intake, Supplement acceptance, Diet advancement, Labs, Weight trends, Skin, I & O's  REASON FOR ASSESSMENT:   Low Braden, Malnutrition Screening Tool    ASSESSMENT:   85 year old man with history of dementia and CKD stage IV of his renal transplant from 1994 on immunosuppression presenting with altered mental status, generalized weakness, dysuria, and shortness of breath.  He has had about 3 days of dysuria and abdominal pain, then became more withdrawn.  Last night he developed chest pain and shortness of breath and she called 911.  Pt admitted with sepsis, rt upper lobe pneumonia, and UTI.   Reviewed I/O's: +1.5 L x 24 hours and +2.9 L since admission  UOP: 850 ml x 24 hours  Pt with waxing and waning mentation. Due to dementia, he is unable to provide further nutrition-related history.   Per SLP note yesterday, pt not alert enough to participate in BSE yesterday. Pt is NPO. He has Ensure and Prostat already ordered per [facility]. RD will order supplements once diet is advanced. Per MD notes, pt able to take medications with applesauce today.   Reviewed wt hx; pt has experienced a 19.1% wt loss over the past 3 months, which is significant for time frame. Pt with prior history of severe malnutrition. RD suspects malnutrition is ongoing, however, unable to identify at this time without further history and completion of nutrition-focused physical exam.   Labs reviewed.   NUTRITION - FOCUSED PHYSICAL EXAM:    Most Recent Value  Orbital Region  Unable to assess   Upper Arm Region  Unable to assess  Thoracic and Lumbar Region  Unable to assess  Buccal Region  Unable to assess  Temple Region  Unable to assess  Clavicle Bone Region  Unable to assess  Clavicle and Acromion Bone Region  Unable to assess  Scapular Bone Region  Unable to assess  Dorsal Hand  Unable to assess  Patellar Region  Unable to assess  Anterior Thigh Region  Unable to assess  Posterior Calf Region  Unable to assess  Edema (RD Assessment)  Unable to assess  Hair  Unable to assess  Eyes  Unable to assess  Mouth  Unable to assess  Skin  Unable to assess  Nails  Unable to assess       Diet Order:   Diet Order            Diet NPO time specified Except for: Sips with Meds  Diet effective now              EDUCATION NEEDS:   No education needs have been identified at this time  Skin:  Skin Assessment: Reviewed RN Assessment  Last BM:  01/24/2019  Height:   Ht Readings from Last 1 Encounters:  02/01/2019 5\' 9"  (1.753 m)    Weight:   Wt Readings from Last 1 Encounters:  01/16/19 51.1 kg    Ideal Body Weight:  72.7 kg  BMI:  Body mass index is 16.64 kg/m.  Estimated Nutritional Needs:   Kcal:  1600-1800  Protein:  80-95 grams  Fluid:  >  1.6 L    Marki Frede A. Jimmye Norman, RD, LDN, [place] Registered Dietitian II Certified Diabetes Care and Education Specialist Pager: [phone] After hours Pager: [phone]

## 2019-01-16 NOTE — Progress Notes (Signed)
[facility] NEPHROLOGY PROGRESS NOTE  Assessment/ Plan: Pt is a 85 y.o. yo male with PMH of HTN, CAD, A. 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 Jimmy Footman at [facility], CKD stage III with b/l cr around 1.8-2, RCC s/p b/l nephrectomies of his native kidneys, dementia and severe malnutrition, FTT, presented to the ER with abdominal pain, weakness, chest pain and shortness of breath. We are consulted for AKI on CKD.   #Acute kidney injury on CKD stage 3 likely ATN in the setting of septic shock: -UA with UTI, blood pressure improved with IV bolus.  Ultrasound kidney with no obstruction or acute finding in transplanted kidney. -Urine output 850 cc, serum creatinine level and BUN trending down.  Encourage oral intake, continue IV fluid. -Given his comorbidities including advanced dementia, malnutrition patient is not a candidate for dialysis.  I have discussed with patient's daughter yesterday. -Avoid nephrotoxins including IV contrast, NSAIDs.  #Kidney transplant/immunosuppression status: -Received hydrocortisone and now changing to oral prednisone, continue cyclosporine.  Continue to hold Myfortic for now because of sepsis.  -Follow-up cyclosporine level.  #Septic shock due to UTI and pneumonia: On broad-spectrum antibiotics.  BP is better with IV fluid.  Hold antihypertensive medication.  Follow-up culture results.  #Hyperkalemia: Improved.  #Severe malnutrition/FTT/dementia: Recommend palliative care consult.  Encourage oral intake.    He is DNR.  #Anemia due to chronic disease: Monitor CBC.  Transfuse as needed  Subjective: Examined at bedside.  Lying on bed.  More alert today.  Denied headache, dizziness, nausea, vomiting chest pain.  Review of system limited. Objective Vital signs in last 24 hours: Vitals:   02/06/2019 1739 02/05/2019 2100 01/24/2019 2245 01/16/19 0649   BP:   107/77   Pulse:      Resp:   12   Temp: 98.2 F (36.8 C) 97.8 F (36.6 C)  97.6 F (36.4 C)  TempSrc: Rectal     SpO2:      Weight:    51.1 kg  Height:       Weight change: -8.653 kg  Intake/Output Summary (Last 24 hours) at 01/16/2019 1329 Last data filed at 01/16/2019 0650 Gross per 24 hour  Intake 203.72 ml  Output 550 ml  Net -346.28 ml       Labs: Basic Metabolic Panel: Recent Labs  Lab 01/23/2019 0541 01/16/2019 1203 01/16/19 0438  NA 133* 135 138  K 5.5* 4.7 4.7  CL 91* 94* 99  CO2 23 21* 21*  GLUCOSE 130* 122* 104*  BUN 210* 196* 181*  CREATININE 5.29* 4.88* 4.70*  CALCIUM 9.0 8.7* 8.8*  PHOS  --  5.8*  --    Liver Function Tests: Recent Labs  Lab 01/24/2019 0541 01/18/2019 1203  AST 35  --   ALT 15  --   ALKPHOS 58  --   BILITOT 1.2  --   PROT 7.0  --   ALBUMIN 1.9* 1.8*   No results for input(s): LIPASE, AMYLASE in the last 168 hours. No results for input(s): AMMONIA in the last 168 hours. CBC: Recent Labs  Lab 01/16/2019 0541 01/16/19 0438  WBC 13.0* 12.5*  NEUTROABS 12.1*  --   HGB 8.3* 7.6*  HCT 24.8* 22.9*  MCV 87.3 87.1  PLT 205 181   Cardiac Enzymes: Recent Labs  Lab 02/01/2019 0541 02/02/2019 1203  TROPONINI 0.09* 0.08*   CBG: Recent Labs  Lab  01/12/2019 0505  GLUCAP 97    Iron Studies: No results for input(s): IRON, TIBC, TRANSFERRIN, FERRITIN in the last 72 hours. Studies/Results: Ct Head Wo Contrast  Result Date: 01/14/2019 CLINICAL DATA:  Altered mental status with unclear cause EXAM: CT HEAD WITHOUT CONTRAST TECHNIQUE: Contiguous axial images were obtained from the base of the skull through the vertex without intravenous contrast. COMPARISON:  11/07/2018 FINDINGS: Brain: No evidence of acute infarction, hemorrhage, hydrocephalus, extra-axial collection or mass lesion/mass effect. Generalized atrophy. Extensive chronic small vessel ischemic change in the cerebral white matter. Remote lenticulostriate infarct in the right  basal ganglia. Small remote bilateral cerebellar infarcts. Vascular: Atherosclerotic calcification Skull: Negative for fracture or bone lesion. Sinuses/Orbits: Left cataract resection IMPRESSION: 1. No acute finding. 2. Atrophy and extensive chronic small vessel ischemia. Electronically Signed   By: Monte Fantasia M.D.   On: 02/04/2019 06:06   US Renal  Result Date: 01/28/2019 CLINICAL DATA:  Inpatient. Renal failure. History of bilateral nephrectomy and left lower quadrant renal transplant. EXAM: RENAL / URINARY TRACT ULTRASOUND COMPLETE COMPARISON:  10/01/2018 renal sonogram. FINDINGS: The native kidneys are surgically absent. No mass or fluid collection demonstrated in nephrectomy beds. Left lower quadrant renal transplant measures 8.9 x 4.4 x 4.5 cm (volume = 92 cm^3). No transplant hydronephrosis. Renal cyst measuring 0.9 x 0.6 x 0.8 cm in the upper left renal transplant, not previously demonstrated with questionable heterogeneous internal echoes and no internal vascularity on color Doppler, most compatible with a minimally complex benign renal cyst. No additional transplant renal lesions. No perinephric collections. Bladder: Foley catheter is in place within the urinary bladder, which is incompletely decompressed. There is hypodense material in the posterior bladder measuring 3.2 x 2.8 x 7.4 cm without internal vascularity on color Doppler. Enlarged prostate measuring approximately 6.4 x 5.5 x 5.9 cm (108 cc prostate volume). IMPRESSION: 1. No acute abnormality with the left lower quadrant renal transplant. No transplant hydronephrosis. Benign-appearing subcentimeter renal cyst in the upper left renal transplant. 2. Hypodense material in the posterior bladder without internal vascularity on color Doppler, nonspecific, suggesting layering debris/blood products. Bladder incompletely decompressed by indwelling Foley catheter. Prominent prostate enlargement. 3. Bilateral native nephrectomy. Electronically  Signed   By: Ilona Sorrel M.D.   On: 02/07/2019 10:45   Dg Chest Port 1 View  Result Date: 02/01/2019 CLINICAL DATA:  Altered mental status and hypotension EXAM: PORTABLE CHEST 1 VIEW COMPARISON:  09/30/2018 FINDINGS: Background of emphysema. Extensive airspace disease in the right upper lobe. Lower lobe streaky opacities could be infection or scarring. Borderline heart size. Aortic tortuosity. No Kerley lines, effusion, or pneumothorax. Calcified pleural plaques. IMPRESSION: 1. Right upper lobe pneumonia. 2. Emphysema. Electronically Signed   By: Monte Fantasia M.D.   On: 01/20/2019 05:52    Medications: Infusions: . sodium chloride      Scheduled Medications: . aspirin  81 mg Oral Daily  . azithromycin  250 mg Oral Daily  . ceFEPime (MAXIPIME) IV  500 mg Intravenous Q24H  . cycloSPORINE modified  125 mg Oral BID  . docusate sodium  200 mg Oral QHS  . enoxaparin (LOVENOX) injection  30 mg Subcutaneous Q24H  . famotidine  20 mg Oral QHS  . feeding supplement (ENSURE ENLIVE)  237 mL Oral TID BM  . feeding supplement (PRO-STAT SUGAR FREE 64)  30 mL Oral BID BM  . memantine  10 mg Oral BID  . multivitamin with minerals  1 tablet Oral Daily  . [START ON 01/17/2019] predniSONE  5 mg Oral Q breakfast  . sodium zirconium cyclosilicate  5 g Oral Once  . tamsulosin  0.4 mg Oral QPC supper    have reviewed scheduled and prn medications.  Physical Exam: General: Elderly cachectic male lying in bed, dry mucous membrane Heart:RRR, s1s2 nl, no rubs Lungs:clear b/l, no crackle Abdomen:soft, Non-tender, non-distended Extremities:No edema, reduced muscle mass Neurology: Alert awake and following commands.  Dron Prasad Bhandari 01/16/2019,1:29 PM  LOS: 1 day

## 2019-01-17 DIAGNOSIS — B9620 Unspecified Escherichia coli [E. coli] as the cause of diseases classified elsewhere: Secondary | ICD-10-CM

## 2019-01-17 DIAGNOSIS — R1314 Dysphagia, pharyngoesophageal phase: Secondary | ICD-10-CM

## 2019-01-17 DIAGNOSIS — B965 Pseudomonas (aeruginosa) (mallei) (pseudomallei) as the cause of diseases classified elsewhere: Secondary | ICD-10-CM

## 2019-01-17 LAB — CBC
HCT: 22.7 % — ABNORMAL LOW (ref 39.0–52.0)
Hemoglobin: 7.5 g/dL — ABNORMAL LOW (ref 13.0–17.0)
MCH: 28.6 pg (ref 26.0–34.0)
MCHC: 33 g/dL (ref 30.0–36.0)
MCV: 86.6 fL (ref 80.0–100.0)
Platelets: 178 10*3/uL (ref 150–400)
RBC: 2.62 MIL/uL — ABNORMAL LOW (ref 4.22–5.81)
RDW: 18.5 % — ABNORMAL HIGH (ref 11.5–15.5)
WBC: 10.6 10*3/uL — ABNORMAL HIGH (ref 4.0–10.5)
nRBC: 0 % (ref 0.0–0.2)

## 2019-01-17 LAB — RENAL FUNCTION PANEL
Albumin: 1.8 g/dL — ABNORMAL LOW (ref 3.5–5.0)
Anion gap: 17 — ABNORMAL HIGH (ref 5–15)
BUN: 185 mg/dL — ABNORMAL HIGH (ref 8–23)
CO2: 22 mmol/L (ref 22–32)
Calcium: 9.2 mg/dL (ref 8.9–10.3)
Chloride: 101 mmol/L (ref 98–111)
Creatinine, Ser: 4.78 mg/dL — ABNORMAL HIGH (ref 0.61–1.24)
GFR calc Af Amer: 12 mL/min — ABNORMAL LOW (ref >60)
GFR calc non Af Amer: 10 mL/min — ABNORMAL LOW (ref >60)
Glucose, Bld: 94 mg/dL (ref 70–99)
Phosphorus: 6.5 mg/dL — ABNORMAL HIGH (ref 2.5–4.6)
Potassium: 4.5 mmol/L (ref 3.5–5.1)
Sodium: 140 mmol/L (ref 135–145)

## 2019-01-17 MED ORDER — PRO-STAT SUGAR FREE PO LIQD
30.0000 mL | Freq: Three times a day (TID) | ORAL | Status: DC
  Administered 2019-01-17 – 2019-01-23 (×14): 30 mL via ORAL
  Filled 2019-01-17 (×13): qty 30

## 2019-01-17 MED ORDER — SODIUM CHLORIDE 0.45 % IV SOLN
INTRAVENOUS | Status: AC
  Administered 2019-01-17 – 2019-01-18 (×2): via INTRAVENOUS

## 2019-01-17 MED ORDER — SEVELAMER CARBONATE 800 MG PO TABS
800.0000 mg | ORAL_TABLET | Freq: Three times a day (TID) | ORAL | Status: DC
  Administered 2019-01-17 – 2019-01-19 (×4): 800 mg via ORAL
  Filled 2019-01-17 (×4): qty 1

## 2019-01-17 NOTE — Progress Notes (Signed)
SLP Cancellation Note  Patient Details Name: [name] [name] MRN: [id number] DOB: [birth year]   Cancelled treatment:       Reason Eval/Treat Not Completed: Other (comment). Discussed pts dysphagia with MD yesterday who agreed with plan to proceed with diet with known risk in setting of probable chronic dysphagia. No need for further SLP intervention at this stage of care, will sign off. Please reconsult if further concerns arise.   Herbie [place], MA CCC-SLP  Acute Rehabilitation Services Pager [phone] Office [phone]  Lynann Beaver 01/17/2019, 8:21 AM

## 2019-01-17 NOTE — Progress Notes (Signed)
   Subjective: No overnight events. [name] [name] is sitting up in bed this morning watching tv. He is very alert and smiles widely when the medical team enters the room. He reports that he feels well today. He hopes that his kidney gets better. No acute concerns this morning.   Objective:  Vital signs in last 24 hours: Vitals:   02/01/2019 2245 01/16/19 0649 01/16/19 1519 01/16/19 2030  BP: 107/77     Pulse:      Resp: 12   12  Temp:  97.6 F (36.4 C) (!) 95 F (35 C) 98.8 F (37.1 C)  TempSrc:   Rectal Rectal  SpO2:    97%  Weight:  51.1 kg    Height:       Gen: alert, sitting up in bed, no distress CV: RRR, no murmurs Pulm: CTA in the anterior lung fields Abd: bs+, soft, non-distended, non-tender Ext: no edema   Assessment/Plan:  Principal Problem:   Sepsis ([place]) Active Problems:   BPH (benign prostatic hyperplasia)   Dementia with behavioral disturbance (HCC)   Renal failure   Pyelonephritis of transplanted kidney   CAP (community acquired pneumonia)  [name] [name] is an 85 yo man with a medical history of alzheimer's dementia, CKD IV s/p renal transplant in 1994 on chronic immunosuppression, CAD, atrial fibrillation, chronic anemia, BPH, urinary rentention, s/pcystolitholapaxy, rightorchiectomy, prostatectomy, and chronic hematuria who presented with sepsis in the setting of UTI and RUL pneumonia. He was hypothermic, hypotensive, and bradycardic on admission, but he is now hemodynamically stable.   Sepsis RUL pneumonia UTI Afebrile and hemodynamically stable. Leukocytosis resolved. Alert and interactive today. Urine growing pseudomonas (sensitive to cefepime) and e coli. Blood cx without growth. - F/u blood cx and urine cx speciation - Continue cefepime and azithromycin (day 3) - Transition from stress dose steroids to his home PO prednisone 5mg   Acute on chronic renal failure Hx renal transplant 1994 Hx BPH and urinary retention  Baseline Cr 1.5-2.0. Cr 4.88 on  admission. Remains elevated at 4.78 today. Likely ATN in the setting of sepsis and hypotension. Only 400cc urine in the past 24 hrs, foley in place. He is not a candidate for HD per nephro. He does not appear volume overloaded today, but will monitor closely while on IVF and holding lasix.  - Nephrology following, appreciate recs - Continue home cyclosporine, f/u cyclosporine level - Continue home prednisone 5mg  daily - Hold home mycophenolate - Continue home flomax - Hold home lasix - Continue IVF - Trend renal function  Pharyngeal and esophageal dysphagia: SLP eval showed dysphagia with probable aspiration events.  - Dysphagia 1 diet - Assistance with meals  Afib: Rate-controlled with diltiazem. Not on anticoagulation 2/2 prior GI bleed. HR in the 60s today. - Holding diltiazem   Hyperkalemia: Resolved.  Acute on chronic normocytic anemia: Likely secondary to CKD, chronic hematuria without definite etiology despite cystoscopy and urological evaluation, and iron deficiency (iron level was 15 five months ago). Hb 8.3 on admission, 7.5 today.  - Trend Hb. Transfuse if Hb <7 - No iron therapy at this time in the setting of active infection  CAD: Continue home aspirin Alzheimer's dementia: continue home memantine GOC: Patient is DNR  Dispo: Discharge pending improvement in renal function.  [name] [name], Andree Elk, MD 01/17/2019, 6:58 AM Pager: [phone]

## 2019-01-17 NOTE — Progress Notes (Signed)
[facility]    NEPHROLOGY PROGRESS NOTE  SUBJECTIVE: Feeling well today.  Patient was evaluated early this morning.  No chest pain, shortness of breath, nausea, vomiting, diarrhea or dysuria.  Denies any dysgeusia.  Reports a good appetite.  All other review of systems are negative.   OBJECTIVE:  Vitals:   01/17/19 0845 01/17/19 1347  BP: (!) 147/87   Pulse: (!) 59   Resp: 15   Temp:  (!) 95.6 F (35.3 C)  SpO2: 99%     Intake/Output Summary (Last 24 hours) at 01/17/2019 1635 Last data filed at 01/17/2019 1200 Gross per 24 hour  Intake 867.5 ml  Output 400 ml  Net 467.5 ml      General:  AAOx3 NAD HEENT: MMM [place] AT anicteric sclera Neck:  No JVD, no adenopathy CV:  Heart RRR, no rub Lungs:  L/S CTA bilaterally Abd:  abd SNT/ND with normal BS GU:  Bladder non-palpable Extremities:  No LE edema. Skin:  No skin rash Neuro: No asterixis  MEDICATIONS:  . aspirin  81 mg Oral Daily  . azithromycin  250 mg Oral Daily  . ceFEPime (MAXIPIME) IV  500 mg Intravenous Q24H  . cycloSPORINE modified  125 mg Oral BID  . docusate sodium  200 mg Oral QHS  . enoxaparin (LOVENOX) injection  30 mg Subcutaneous Q24H  . famotidine  20 mg Oral QHS  . feeding supplement (ENSURE ENLIVE)  237 mL Oral TID BM  . feeding supplement (PRO-STAT SUGAR FREE 64)  30 mL Oral TID BM  . memantine  10 mg Oral BID  . multivitamin with minerals  1 tablet Oral Daily  . predniSONE  5 mg Oral Q breakfast  . sodium zirconium cyclosilicate  5 g Oral Once  . tamsulosin  0.4 mg Oral QPC supper       LABS:   CBC Latest Ref Rng & Units 01/17/2019 01/16/2019 01/27/2019  WBC 4.0 - 10.5 K/uL 10.6(H) 12.5(H) 13.0(H)  Hemoglobin 13.0 - 17.0 g/dL 7.5(L) 7.6(L) 8.3(L)  Hematocrit 39.0 - 52.0 % 22.7(L) 22.9(L) 24.8(L)  Platelets 150 - 400 K/uL 178 181 205    CMP Latest Ref Rng & Units 01/17/2019 01/16/2019 02/03/2019  Glucose 70 - 99 mg/dL 94 104(H) 122(H)  BUN 8 - 23 mg/dL 185(H) 181(H) 196(H)  Creatinine  0.61 - 1.24 mg/dL 4.78(H) 4.70(H) 4.88(H)  Sodium 135 - 145 mmol/L 140 138 135  Potassium 3.5 - 5.1 mmol/L 4.5 4.7 4.7  Chloride 98 - 111 mmol/L 101 99 94(L)  CO2 22 - 32 mmol/L 22 21(L) 21(L)  Calcium 8.9 - 10.3 mg/dL 9.2 8.8(L) 8.7(L)  Total Protein 6.5 - 8.1 g/dL - - -  Total Bilirubin 0.3 - 1.2 mg/dL - - -  Alkaline Phos 38 - 126 U/L - - -  AST 15 - 41 U/L - - -  ALT 0 - 44 U/L - - -    Lab Results  Component Value Date   CALCIUM 9.2 01/17/2019   PHOS 6.5 (H) 01/17/2019       Component Value Date/Time   COLORURINE YELLOW 01/23/2019 0541   APPEARANCEUR CLOUDY (A) 02/01/2019 0541   LABSPEC 1.010 02/09/2019 0541   PHURINE 6.0 01/22/2019 0541   GLUCOSEU NEGATIVE 01/12/2019 0541   HGBUR LARGE (A) 01/10/2019 [place] NEGATIVE 01/20/2019 [place] 01/24/2019 0541   PROTEINUR 30 (A) 01/26/2019 0541   NITRITE NEGATIVE 01/19/2019 0541   LEUKOCYTESUR LARGE (A) 01/23/2019 0541  No results found for: PHART, PCO2ART, PO2ART, HCO3, TCO2, ACIDBASEDEF, O2SAT     Component Value Date/Time   IRON 15 (L) 10/01/2018 0950   TIBC 136 (L) 10/01/2018 0950   FERRITIN 335 10/01/2018 0950   IRONPCTSAT 11 (L) 10/01/2018 0950       ASSESSMENT/PLAN:    85 year old male patient with a past medical history significant for hypertension, coronary artery disease, atrial fibrillation, BPH status post prostate surgery, 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 Jimmy Footman at [facility], CKD stage III-IV with a baseline serum creatinine around 1.8-2, renal cell carcinoma status post bilateral nephrectomies of his native kidneys, dementia and severe malnutrition who presents to the emergency department with abdominal pain, weakness, chest pain, and shortness of breath.  He was noted to have acute kidney injury likely secondary to ATN in the setting of septic shock from a urinary tract infection.  1.  Chronic kidney disease stage III-IV with a  baseline serum creatinine around 1.8-2.  He has underlying FSGS and is status post renal transplantation x2.  2.  Acute kidney injury.  Likely on the basis of ATN in the setting of septic shock.  Renal function is slowly improving.  Would avoid large amounts of protein intake with markedly elevated BUN.  Monitor closely for signs of uremia.  Currently no urgent indication for dialysis.  3.  Kidney transplant immunosuppression.  Received stress dose steroids with hydrocortisone due to septic shock and is now on oral prednisone.  Continue cyclosporine.  Myfortic is on hold due to sepsis.  Cyclosporine level is pending.. . 4.  Septic shock due to UTI and pneumonia.  Improved.  On azithromycin and cefepime.  5.  Hyperkalemia.  Improved.  6.  Anemia due to chronic disease.  Transfuse PRN.  7.  Severe malnutrition and protein calorie malnutrition.  Continue protein supplements but would not be too aggressive in the setting of a markedly elevated BUN.   [place], DO, [place]

## 2019-01-17 NOTE — Progress Notes (Signed)
  Date: 01/17/2019  Patient name: [name] [name]  Medical record number: [id number]  Date of birth: 1928-03-01   I have seen and evaluated this patient and I have discussed the plan of care with the house staff. Please see their note for complete details.  Lenice Pressman, M.D., Ph.D. 01/17/2019, 3:58 PM

## 2019-01-17 NOTE — Progress Notes (Signed)
PT Cancellation Note  Patient Details Name: [name] [name] MRN: [id number] DOB: 1928/04/03   Cancelled Treatment:    Reason Eval/Treat Not Completed: Active bedrest order. Pt remains with strict bed rest orders in place. PT will continue to follow acutely and await updated activity orders.    [place] 01/17/2019, 8:13 AM

## 2019-01-17 NOTE — Progress Notes (Signed)
Nutrition Follow-up  RD working remotely.  DOCUMENTATION CODES:   Underweight  INTERVENTION:   -Continue MVI with minerals daily -Continue Ensure Enlive po TID, each supplement provides 350 kcal and 20 grams of protein -Increase 30 ml Prostat to TID, each supplement provides 100 kcals and 15 grams protein -Magic Cup TID with meals, each supplement provides 290 kcals and 9 grams protein -Hormel Shake TID with meals, each supplement provides 520 kcals and 22 grams protein -Feeding assistance with meals  NUTRITION DIAGNOSIS:   Predicted suboptimal nutrient intake related to chronic illness(dementia) as evidenced by percent weight loss.  Ongoing  GOAL:   Patient will meet greater than or equal to 90% of their needs  Unmet  MONITOR:   PO intake, Supplement acceptance, Diet advancement, Labs, Weight trends, Skin, I & O's  REASON FOR ASSESSMENT:   Low Braden, Malnutrition Screening Tool    ASSESSMENT:   85 year old man with history of dementia and CKD stage IV of his renal transplant from 1994 on immunosuppression presenting with altered mental status, generalized weakness, dysuria, and shortness of breath.  He has had about 3 days of dysuria and abdominal pain, then became more withdrawn.  Last night he developed chest pain and shortness of breath and she called 911.  5/7- s/p repeat BSE- advanced to dysphagia 1 diet with thin liquids  Reviewed I/O's: +468 ml x 24 hours and +3.4 L since admission  UOP: 400 ml x 24 hours  Per SLP notes, plan to proceed with diet with known risk in setting of likely chronic dysphagia and for comfort.  No meal completion data available for review at this time. Per MAR, pt has either been too lethargic to take supplements or refusing them.   Reviewed wt hx; pt has experienced a 19.1% wt loss over the past 3 months, which is significant for time frame. Pt with prior history of severe malnutrition. RD suspects malnutrition is ongoing, however,  unable to identify at this time without further history and completion of nutrition-focused physical exam.   Labs reviewed: Phos: 6.5.  Diet Order:   Diet Order            DIET - DYS 1 Room service appropriate? Yes; Fluid consistency: Thin  Diet effective now              EDUCATION NEEDS:   No education needs have been identified at this time  Skin:  Skin Assessment: Reviewed RN Assessment  Last BM:  01/30/2019  Height:   Ht Readings from Last 1 Encounters:  01/24/2019 5\' 9"  (1.753 m)    Weight:   Wt Readings from Last 1 Encounters:  01/16/19 51.1 kg    Ideal Body Weight:  72.7 kg  BMI:  Body mass index is 16.64 kg/m.  Estimated Nutritional Needs:   Kcal:  1600-1800  Protein:  80-95 grams  Fluid:  > 1.6 L    Sukhman Martine A. Jimmye Norman, RD, LDN, [place] Registered Dietitian II Certified Diabetes Care and Education Specialist Pager: [phone] After hours Pager: [phone]

## 2019-01-18 DIAGNOSIS — Z960 Presence of urogenital implants: Secondary | ICD-10-CM

## 2019-01-18 DIAGNOSIS — N3000 Acute cystitis without hematuria: Secondary | ICD-10-CM

## 2019-01-18 DIAGNOSIS — E2740 Unspecified adrenocortical insufficiency: Secondary | ICD-10-CM

## 2019-01-18 LAB — CBC
HCT: 23.3 % — ABNORMAL LOW (ref 39.0–52.0)
Hemoglobin: 7.5 g/dL — ABNORMAL LOW (ref 13.0–17.0)
MCH: 28.2 pg (ref 26.0–34.0)
MCHC: 32.2 g/dL (ref 30.0–36.0)
MCV: 87.6 fL (ref 80.0–100.0)
Platelets: 179 10*3/uL (ref 150–400)
RBC: 2.66 MIL/uL — ABNORMAL LOW (ref 4.22–5.81)
RDW: 18.6 % — ABNORMAL HIGH (ref 11.5–15.5)
WBC: 5.8 10*3/uL (ref 4.0–10.5)
nRBC: 0 % (ref 0.0–0.2)

## 2019-01-18 LAB — RENAL FUNCTION PANEL
Albumin: 1.8 g/dL — ABNORMAL LOW (ref 3.5–5.0)
Anion gap: 16 — ABNORMAL HIGH (ref 5–15)
BUN: 182 mg/dL — ABNORMAL HIGH (ref 8–23)
CO2: 21 mmol/L — ABNORMAL LOW (ref 22–32)
Calcium: 9.3 mg/dL (ref 8.9–10.3)
Chloride: 102 mmol/L (ref 98–111)
Creatinine, Ser: 4.52 mg/dL — ABNORMAL HIGH (ref 0.61–1.24)
GFR calc Af Amer: 13 mL/min — ABNORMAL LOW (ref >60)
GFR calc non Af Amer: 11 mL/min — ABNORMAL LOW (ref >60)
Glucose, Bld: 102 mg/dL — ABNORMAL HIGH (ref 70–99)
Phosphorus: 5.6 mg/dL — ABNORMAL HIGH (ref 2.5–4.6)
Potassium: 4.7 mmol/L (ref 3.5–5.1)
Sodium: 139 mmol/L (ref 135–145)

## 2019-01-18 LAB — URINE CULTURE: Culture: >=100000 — AB

## 2019-01-18 MED ORDER — SODIUM CHLORIDE 0.45 % IV SOLN
INTRAVENOUS | Status: AC
  Administered 2019-01-19: 06:00:00 via INTRAVENOUS

## 2019-01-18 MED ORDER — HYDROCORTISONE NA SUCCINATE PF 100 MG IJ SOLR: 50.0000 mg | Freq: Every day | INTRAMUSCULAR | Status: DC

## 2019-01-18 MED ORDER — HYDROCORTISONE NA SUCCINATE PF 100 MG IJ SOLR
50.0000 mg | Freq: Once | INTRAMUSCULAR | Status: AC
  Administered 2019-01-18: 14:00:00 50 mg via INTRAVENOUS
  Filled 2019-01-18: qty 2

## 2019-01-18 MED ORDER — PREDNISONE 20 MG PO TABS
20.0000 mg | ORAL_TABLET | Freq: Every day | ORAL | Status: DC
  Administered 2019-01-19 – 2019-01-21 (×3): 20 mg via ORAL
  Filled 2019-01-18 (×3): qty 1

## 2019-01-18 NOTE — Progress Notes (Signed)
Pharmacy Antibiotic Note  [name] [name] is a 85 y.o. male admitted on 01/23/2019 with sepsis.  Pharmacy has been consulted for cefepime dosing.  Presented with weakness, abdominal pain, and hypotension. CXR showing R upper lobe PNA. Hypothermic on bear hugger. WBC down. SCr trending down 4.52. CrCl ~8.69mL/min. UOP 0.7 mL/kg/hr.   Plan: Cefepime 500 mg IV every 24 hours  Monitor renal fx, clinical pic, cx results, and vanc levels as appropriate  Height: 5\' 9"  (175.3 cm) Weight: 112 lb 10.5 oz (51.1 kg) IBW/kg (Calculated) : 70.7  Temp (24hrs), Avg:95.9 F (35.5 C), Min:95.1 F (35.1 C), Max:97.4 F (36.3 C)  Recent Labs  Lab 01/27/2019 0541 01/22/2019 0726 01/18/2019 1203 01/16/19 0438 01/17/19 0319 01/18/19 0321  WBC 13.0*  --   --  12.5* 10.6*  --   CREATININE 5.29*  --  4.88* 4.70* 4.78* 4.52*  LATICACIDVEN 2.0* 1.9  --   --   --   --   VANCORANDOM  --   --   --  11  --   --     Estimated Creatinine Clearance: 8.2 mL/min (A) (by C-G formula based on SCr of 4.52 mg/dL (H)).    Allergies  Allergen Reactions  . Ciprofloxacin Rash  . Clindamycin/Lincomycin Rash  . Levofloxacin Rash  . Septra [Sulfamethoxazole-Trimethoprim] Rash    Antimicrobials this admission: Vanc 5/6 x1 Metronidazole 5/6 x1  Cefepime 5/6>> Azith 5/6 >>5/10  Dose adjustments this admission: N/A  Microbiology results: Ucx 5/6: Pseudomonas + Ecoli (both pan sensitive) COVID 5/6: negative Bcx 5/6 x2: ngtd x2 days  Thank you for allowing pharmacy to be a part of this patient's care.  Sloan Leiter, PharmD, BCPS, BCCCP Clinical Pharmacist Please refer to [facility] for [place] numbers 01/18/2019 11:19 AM

## 2019-01-18 NOTE — Progress Notes (Signed)
[facility]    NEPHROLOGY PROGRESS NOTE  SUBJECTIVE: Feeling well today.  Patient was evaluated early this morning.  No chest pain, shortness of breath, nausea, vomiting, diarrhea or dysuria.  Denies any dysgeusia.  Reports a good appetite.  All other review of systems are negative.   OBJECTIVE:  Vitals:   01/18/19 0555 01/18/19 0759  BP: (!) 147/107 (!) 161/91  Pulse: 71 (!) 46  Resp: 11 11  Temp: (!) 95.4 F (35.2 C)   SpO2: 98% 99%    Intake/Output Summary (Last 24 hours) at 01/18/2019 1034 Last data filed at 01/18/2019 0300 Gross per 24 hour  Intake 731.08 ml  Output 850 ml  Net -118.92 ml      General:  AAOx3 NAD HEENT: MMM [place] AT anicteric sclera Neck:  No JVD, no adenopathy CV:  Heart RRR, no rub Lungs:  L/S CTA bilaterally Abd:  abd SNT/ND with normal BS GU:  Bladder non-palpable Extremities:  No LE edema. Skin:  No skin rash Neuro: No asterixis  MEDICATIONS:  . aspirin  81 mg Oral Daily  . azithromycin  250 mg Oral Daily  . ceFEPime (MAXIPIME) IV  500 mg Intravenous Q24H  . cycloSPORINE modified  125 mg Oral BID  . docusate sodium  200 mg Oral QHS  . enoxaparin (LOVENOX) injection  30 mg Subcutaneous Q24H  . famotidine  20 mg Oral QHS  . feeding supplement (ENSURE ENLIVE)  237 mL Oral TID BM  . feeding supplement (PRO-STAT SUGAR FREE 64)  30 mL Oral TID BM  . memantine  10 mg Oral BID  . multivitamin with minerals  1 tablet Oral Daily  . predniSONE  5 mg Oral Q breakfast  . sevelamer carbonate  800 mg Oral TID WC  . sodium zirconium cyclosilicate  5 g Oral Once  . tamsulosin  0.4 mg Oral QPC supper       LABS:   CBC Latest Ref Rng & Units 01/17/2019 01/16/2019 01/28/2019  WBC 4.0 - 10.5 K/uL 10.6(H) 12.5(H) 13.0(H)  Hemoglobin 13.0 - 17.0 g/dL 7.5(L) 7.6(L) 8.3(L)  Hematocrit 39.0 - 52.0 % 22.7(L) 22.9(L) 24.8(L)  Platelets 150 - 400 K/uL 178 181 205    CMP Latest Ref Rng & Units 01/18/2019 01/17/2019 01/16/2019  Glucose 70 - 99 mg/dL  102(H) 94 104(H)  BUN 8 - 23 mg/dL 182(H) 185(H) 181(H)  Creatinine 0.61 - 1.24 mg/dL 4.52(H) 4.78(H) 4.70(H)  Sodium 135 - 145 mmol/L 139 140 138  Potassium 3.5 - 5.1 mmol/L 4.7 4.5 4.7  Chloride 98 - 111 mmol/L 102 101 99  CO2 22 - 32 mmol/L 21(L) 22 21(L)  Calcium 8.9 - 10.3 mg/dL 9.3 9.2 8.8(L)  Total Protein 6.5 - 8.1 g/dL - - -  Total Bilirubin 0.3 - 1.2 mg/dL - - -  Alkaline Phos 38 - 126 U/L - - -  AST 15 - 41 U/L - - -  ALT 0 - 44 U/L - - -    Lab Results  Component Value Date   CALCIUM 9.3 01/18/2019   PHOS 5.6 (H) 01/18/2019       Component Value Date/Time   COLORURINE YELLOW 02/06/2019 0541   APPEARANCEUR CLOUDY (A) 01/23/2019 0541   LABSPEC 1.010 01/21/2019 0541   PHURINE 6.0 01/14/2019 0541   GLUCOSEU NEGATIVE 01/22/2019 0541   HGBUR LARGE (A) 01/22/2019 0541   BILIRUBINUR NEGATIVE 01/21/2019 0541   KETONESUR NEGATIVE 02/07/2019 0541   PROTEINUR 30 (A) 02/01/2019 0541   NITRITE NEGATIVE  02/03/2019 0541   LEUKOCYTESUR LARGE (A) 01/20/2019 0541   No results found for: PHART, PCO2ART, PO2ART, HCO3, TCO2, ACIDBASEDEF, O2SAT     Component Value Date/Time   IRON 15 (L) 10/01/2018 0950   TIBC 136 (L) 10/01/2018 0950   FERRITIN 335 10/01/2018 0950   IRONPCTSAT 11 (L) 10/01/2018 0950       ASSESSMENT/PLAN:    85 year old male patient with a past medical history significant for hypertension, coronary artery disease, atrial fibrillation, BPH status post prostate surgery, 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 Jimmy Footman at [facility], CKD stage III-IV with a baseline serum creatinine around 1.8-2, renal cell carcinoma status post bilateral nephrectomies of his native kidneys, dementia and severe malnutrition who presents to the emergency department with abdominal pain, weakness, chest pain, and shortness of breath.  He was noted to have acute kidney injury likely secondary to ATN in the setting of septic shock from a urinary  tract infection.  1.  Chronic kidney disease stage III-IV with a baseline serum creatinine around 1.8-2.  He has underlying FSGS and is status post renal transplantation x2.  2.  Acute kidney injury.  Likely on the basis of ATN in the setting of septic shock.  Renal function is slowly improving.  Would avoid large amounts of protein intake with markedly elevated BUN.  Monitor closely for signs of uremia.  Currently no urgent indication for dialysis.  3.  Kidney transplant immunosuppression.  Received stress dose steroids with hydrocortisone due to septic shock and is now on oral prednisone.  Continue cyclosporine.  Myfortic was on hold due to sepsis - will restart at or prior to discharge.  Cyclosporine level is pending. . 4.  Septic shock due to UTI and pneumonia.  Improved.  On azithromycin and cefepime.  5.  Hyperkalemia.  Improved.  6.  Anemia due to chronic disease.  Transfuse PRN.  7.  Severe malnutrition and protein calorie malnutrition.  Continue protein supplements but would not be too aggressive in the setting of a markedly elevated BUN.  8.  Hyperphosphatemia.  Sevelamer added yesterday.   [place], DO, [place]

## 2019-01-18 NOTE — Evaluation (Signed)
Physical Therapy Evaluation Patient Details Name: [name] [name] MRN: [id number] DOB: 02-27-1928 Today's Date: 01/18/2019   History of Present Illness  Pt is a 85 y.o. M with significant PMH of alzheimer's dementia, CKD IV s/p renal transplant, CAD, atrial fibrillation, who presents with sepsis in setting of UTI and RUL pneumonia.   Clinical Impression  Pt admitted with above. Prior to admission, pt lives with his daughter and has assistance from his son during the day. Pt daughter reports he is a limited household ambulator with walker, self feeds, and is able to perform upper body dressing. On PT evaluation, pt presenting with decreased functional mobility compared to baseline. He is requiring assist for feeding and two person mod-maximal assistance for transfers. He is unable to walk due to significant deconditioning/debility. Per phone discussion with pt daughter, pt interested in SNF but would like to talk to her brother before pursuing a decision.     Follow Up Recommendations SNF;Supervision/Assistance - 24 hour (if home, needs HHPT/HH aide)    Equipment Recommendations  None recommended by PT    Recommendations for Other Services       Precautions / Restrictions Precautions Precautions: Fall Restrictions Weight Bearing Restrictions: No      Mobility  Bed Mobility Overal bed mobility: Needs Assistance Bed Mobility: Supine to Sit;Sit to Supine     Supine to sit: Max assist;+2 for physical assistance Sit to supine: Max assist;+2 for physical assistance      Transfers Overall transfer level: Needs assistance Equipment used: Rolling walker (2 wheeled) Transfers: Sit to/from Stand Sit to Stand: Mod assist;+2 physical assistance         General transfer comment: Visual demonstration for hand positioning, modA + 2 to power up  Ambulation/Gait             General Gait Details: unable  Stairs            Wheelchair Mobility    Modified Rankin (Stroke  Patients Only)       Balance Overall balance assessment: Needs assistance Sitting-balance support: Feet supported Sitting balance-Leahy Scale: Fair     Standing balance support: Bilateral upper extremity supported Standing balance-Leahy Scale: Poor Standing balance comment: reliant on external support                             Pertinent Vitals/Pain Pain Assessment: Faces Faces Pain Scale: No hurt    Home Living Family/patient expects to be discharged to:: Private residence Living Arrangements: Other relatives(daughter) Available Help at Discharge: Family;Available PRN/intermittently Type of Home: House Home Access: Level entry     Home Layout: One level Home Equipment: Clinical cytogeneticist - 4 wheels;Walker - 2 wheels Additional Comments: pt brother stays with pt during the day    Prior Function Level of Independence: Needs assistance   Gait / Transfers Assistance Needed: pt performing transfers and limited walking to bathroom with walker  ADL's / Homemaking Assistance Needed: daughter assists with lower body dressing, bathing        Hand Dominance   Dominant Hand: Right    Extremity/Trunk Assessment   Upper Extremity Assessment Upper Extremity Assessment: Generalized weakness    Lower Extremity Assessment Lower Extremity Assessment: Generalized weakness    Cervical / Trunk Assessment Cervical / Trunk Assessment: Other exceptions Cervical / Trunk Exceptions: cachetic appearing  Communication   Communication: HOH  Cognition Arousal/Alertness: Awake/alert Behavior During Therapy: Flat affect Overall Cognitive Status: History of cognitive impairments -  at baseline                                 General Comments: history of dementia      General Comments      Exercises     Assessment/Plan    PT Assessment Patient needs continued PT services  PT Problem List Decreased strength;Decreased activity tolerance;Decreased  balance;Decreased mobility       PT Treatment Interventions DME instruction;Gait training;Functional mobility training;Therapeutic activities;Therapeutic exercise;Balance training;Neuromuscular re-education;Patient/family education    PT Goals (Current goals can be found in the Care Plan section)  Acute Rehab PT Goals Patient Stated Goal: none stated by pt; pt daughter would like pt to be back at baseline PT Goal Formulation: With patient/family Time For Goal Achievement: 02/01/19 Potential to Achieve Goals: Fair    Frequency Min 3X/week   Barriers to discharge        Co-evaluation               AM-PAC PT "6 Clicks" Mobility  Outcome Measure Help needed turning from your back to your side while in a flat bed without using bedrails?: A Little Help needed moving from lying on your back to sitting on the side of a flat bed without using bedrails?: Total Help needed moving to and from a bed to a chair (including a wheelchair)?: A Lot Help needed standing up from a chair using your arms (e.g., wheelchair or bedside chair)?: A Lot Help needed to walk in hospital room?: Total Help needed climbing 3-5 steps with a railing? : Total 6 Click Score: 10    End of Session   Activity Tolerance: Patient limited by fatigue Patient left: in bed;with call bell/phone within reach;with bed alarm set Nurse Communication: Mobility status PT Visit Diagnosis: Unsteadiness on feet (R26.81);Other abnormalities of gait and mobility (R26.89);Muscle weakness (generalized) (M62.81)    Time: 1696-7893 PT Time Calculation (min) (ACUTE ONLY): 22 min   Charges:   PT Evaluation $PT Eval Moderate Complexity: 1 Mod        Ellamae Sia, [place], DPT Acute Rehabilitation Services Pager [phone] Office [phone]  Willy Eddy 01/18/2019, 2:48 PM

## 2019-01-18 NOTE — Progress Notes (Signed)
Internal Medicine Attending:   I saw and examined the patient. I reviewed the resident's note and I agree with the resident's findings and plan as documented in the resident's note.  As noted he was hemodynamically stable he has no acute complaints.  His hypothermia has recurred as noted by Dr. Shan Levans this is in relation to stopping his stress dose hydrocortisone.  We have discussed with nephrology will need a more prolonged steroid taper.

## 2019-01-18 NOTE — Progress Notes (Signed)
   Subjective: Patient with some hypothermia overnight.  His mental status continues to improve.  He denies any chest pain, dyspnea or discomfort elsewhere.    Objective:  Vital signs in last 24 hours: Vitals:   01/17/19 2145 01/18/19 0555 01/18/19 0759 01/18/19 1036  BP: (!) 150/93 (!) 147/107 (!) 161/91   Pulse: 82 71 (!) 46   Resp: 15 11 11    Temp: (!) 97.4 F (36.3 C) (!) 95.4 F (35.2 C)  (!) 95.1 F (35.1 C)  TempSrc: Oral Axillary  Rectal  SpO2: 95% 98% 99%   Weight:      Height:       Gen: alert, sitting up in bed, no distress CV: irregular rhythm with normal rate, no murmurs Pulm: CTA in the anterior lung fields Abd: bs+, soft, non-distended, non-tender Ext: no edema   Assessment/Plan:  Principal Problem:   Sepsis ([place]) Active Problems:   BPH (benign prostatic hyperplasia)   Dementia with behavioral disturbance (HCC)   Renal failure   Pyelonephritis of transplanted kidney   CAP (community acquired pneumonia)  [name] [name] is an 85 yo man with a medical history of alzheimer's dementia, CKD IV s/p renal transplant in 1994 on chronic immunosuppression, CAD, atrial fibrillation, chronic anemia, BPH, urinary rentention, s/pcystolitholapaxy, rightorchiectomy, prostatectomy, and chronic hematuria who presented with sepsis in the setting of UTI and RUL pneumonia. He was hypothermic, hypotensive, and bradycardic on admission, but he is now hemodynamically stable.   Sepsis RUL pneumonia UTI Hypothermia Afebrile and hemodynamically stable. Leukocytosis resolved. More alert and interactive. Urine growing pseudomonas (sensitive to cefepime) and e coli. Blood cx without growth.  Pt is hypothermic again uncertain etiology at this time, bp has improved does not appear to be from infection, it could be that his temp dropped after d/c of stress dose steroids.   - Continue cefepime and azithromycin (day 4) - Transitioned from stress dose steroids to his home PO prednisone 5mg  on  5/8, will discuss with nephrology more prolonged stress dose given hypothermia  Acute on chronic renal failure Hx renal transplant 1994 Hx BPH and urinary retention  Baseline Cr 1.5-2.0. Cr 4.88 on admission. Remains elevated at 4.78 today. Likely ATN in the setting of sepsis and hypotension. Only 400cc urine in the past 24 hrs, foley in place. He is not a candidate for HD per nephro. He does not appear volume overloaded today, but will monitor closely while on IVF and holding lasix.  - Nephrology following, appreciate recs - Continue home cyclosporine, f/u cyclosporine level - Continue home prednisone 5mg  daily - Hold home mycophenolate - Continue home flomax - Hold home lasix - Continue IVF - Trend renal function  Pharyngeal and esophageal dysphagia: SLP eval showed dysphagia with probable aspiration events.  - Dysphagia 1 diet - Assistance with meals  Afib: Rate-controlled with diltiazem. Not on anticoagulation 2/2 prior GI bleed. HR in the 60s today. - Holding diltiazem   Hyperkalemia: Resolved.  Acute on chronic normocytic anemia: Likely secondary to CKD, chronic hematuria without definite etiology despite cystoscopy and urological evaluation, and iron deficiency (iron level was 15 five months ago). Hb 8.3 on admission, 7.5 today.  - Trend Hb. Transfuse if Hb <7 - No iron therapy at this time in the setting of active infection  CAD: Continue home aspirin Alzheimer's dementia: continue home memantine GOC: Patient is DNR  Dispo: Discharge pending improvement in renal function.  Katherine Roan, MD 01/18/2019, 10:53 AM

## 2019-01-18 NOTE — Progress Notes (Signed)
MD notified of pt having a rectal temp of 95.1. bear hugger already in place on pt. Hoover Brunette, RN

## 2019-01-19 DIAGNOSIS — R531 Weakness: Secondary | ICD-10-CM

## 2019-01-19 LAB — CBC
HCT: 22.3 % — ABNORMAL LOW (ref 39.0–52.0)
Hemoglobin: 7.1 g/dL — ABNORMAL LOW (ref 13.0–17.0)
MCH: 28.1 pg (ref 26.0–34.0)
MCHC: 31.8 g/dL (ref 30.0–36.0)
MCV: 88.1 fL (ref 80.0–100.0)
Platelets: 162 10*3/uL (ref 150–400)
RBC: 2.53 MIL/uL — ABNORMAL LOW (ref 4.22–5.81)
RDW: 18.6 % — ABNORMAL HIGH (ref 11.5–15.5)
WBC: 5.4 10*3/uL (ref 4.0–10.5)
nRBC: 0 % (ref 0.0–0.2)

## 2019-01-19 LAB — CYCLOSPORINE: Cyclosporine, LabCorp: 207 ng/mL (ref 100–400)

## 2019-01-19 LAB — RENAL FUNCTION PANEL
Albumin: 1.8 g/dL — ABNORMAL LOW (ref 3.5–5.0)
Anion gap: 14 (ref 5–15)
BUN: 177 mg/dL — ABNORMAL HIGH (ref 8–23)
CO2: 24 mmol/L (ref 22–32)
Calcium: 9.2 mg/dL (ref 8.9–10.3)
Chloride: 102 mmol/L (ref 98–111)
Creatinine, Ser: 4.41 mg/dL — ABNORMAL HIGH (ref 0.61–1.24)
GFR calc Af Amer: 13 mL/min — ABNORMAL LOW (ref >60)
GFR calc non Af Amer: 11 mL/min — ABNORMAL LOW (ref >60)
Glucose, Bld: 96 mg/dL (ref 70–99)
Phosphorus: 5.2 mg/dL — ABNORMAL HIGH (ref 2.5–4.6)
Potassium: 4.7 mmol/L (ref 3.5–5.1)
Sodium: 140 mmol/L (ref 135–145)

## 2019-01-19 LAB — PARATHYROID HORMONE, INTACT (NO CA): PTH: 29 pg/mL (ref 15–65)

## 2019-01-19 MED ORDER — SEVELAMER CARBONATE 0.8 G PO PACK
0.8000 g | PACK | Freq: Three times a day (TID) | ORAL | Status: DC
  Administered 2019-01-19 – 2019-01-22 (×7): 0.8 g via ORAL
  Filled 2019-01-19 (×11): qty 1

## 2019-01-19 MED ORDER — CYCLOSPORINE MODIFIED (NEORAL) 100 MG/ML PO SOLN
125.0000 mg | Freq: Two times a day (BID) | ORAL | Status: DC
  Administered 2019-01-19 – 2019-01-24 (×10): 125 mg via ORAL
  Filled 2019-01-19 (×12): qty 1.3

## 2019-01-19 NOTE — Progress Notes (Signed)
MD on call notified of pt coughing more post liquids as well as pt sleeping more today. Pt's blinds are all the way up & pt is sitting up at a 45 degree angle hoping to help support & wake pt up. Pt took all meds well yesterday but even after several attempts today but was still unable to get his multivitamin as well as 3 of his cyclosporine capsules down. Pt coughs only with attempting to drink thin liquids but takes his prostat without any issues. Will continue to monitor pt. Hoover Brunette, RN

## 2019-01-19 NOTE — Progress Notes (Signed)
Internal Medicine Attending:   I saw and examined the patient. I reviewed Dr Maudie Flakes note and I agree with the resident's findings and plan as documented in the resident's note.  Hypothermia improved with increased dose of steroid, overall debilitated and disoriented (oriented only to person today).  Overall he has had some gradual improvement.  Anticipate prolonged recovery.  Appreciate nephrology's assistance.

## 2019-01-19 NOTE — Progress Notes (Signed)
[facility]    NEPHROLOGY PROGRESS NOTE  SUBJECTIVE: Patient resting without complaints.  No chest pain, shortness of breath, nausea, vomiting, diarrhea or dysuria.  Denies any dysgeusia.  Some difficulty tolerating pills.  All other review of systems are negative.   OBJECTIVE:  Vitals:   01/19/19 0816 01/19/19 1112  BP: (!) 148/88 140/80  Pulse:  (!) 54  Resp: 12 20  Temp: 98 F (36.7 C) (!) 97.5 F (36.4 C)  SpO2: 98% 99%    Intake/Output Summary (Last 24 hours) at 01/19/2019 1228 Last data filed at 01/19/2019 1036 Gross per 24 hour  Intake 836.81 ml  Output 2425 ml  Net -1588.19 ml      General:  AAOx3 NAD HEENT: MMM [place] AT anicteric sclera Neck:  No JVD, no adenopathy CV:  Heart RRR, no rub Lungs:  L/S CTA bilaterally Abd:  abd SNT/ND with normal BS GU:  Bladder non-palpable Extremities:  No LE edema. Skin:  No skin rash Neuro: No asterixis  MEDICATIONS:  . aspirin  81 mg Oral Daily  . ceFEPime (MAXIPIME) IV  500 mg Intravenous Q24H  . cycloSPORINE modified  125 mg Oral BID  . docusate sodium  200 mg Oral QHS  . enoxaparin (LOVENOX) injection  30 mg Subcutaneous Q24H  . famotidine  20 mg Oral QHS  . feeding supplement (ENSURE ENLIVE)  237 mL Oral TID BM  . feeding supplement (PRO-STAT SUGAR FREE 64)  30 mL Oral TID BM  . memantine  10 mg Oral BID  . multivitamin with minerals  1 tablet Oral Daily  . predniSONE  20 mg Oral Q breakfast  . sevelamer carbonate  800 mg Oral TID WC  . sodium zirconium cyclosilicate  5 g Oral Once  . tamsulosin  0.4 mg Oral QPC supper       LABS:   CBC Latest Ref Rng & Units 01/19/2019 01/18/2019 01/17/2019  WBC 4.0 - 10.5 K/uL 5.4 5.8 10.6(H)  Hemoglobin 13.0 - 17.0 g/dL 7.1(L) 7.5(L) 7.5(L)  Hematocrit 39.0 - 52.0 % 22.3(L) 23.3(L) 22.7(L)  Platelets 150 - 400 K/uL 162 179 178    CMP Latest Ref Rng & Units 01/19/2019 01/18/2019 01/17/2019  Glucose 70 - 99 mg/dL 96 102(H) 94  BUN 8 - 23 mg/dL 177(H) 182(H) 185(H)   Creatinine 0.61 - 1.24 mg/dL 4.41(H) 4.52(H) 4.78(H)  Sodium 135 - 145 mmol/L 140 139 140  Potassium 3.5 - 5.1 mmol/L 4.7 4.7 4.5  Chloride 98 - 111 mmol/L 102 102 101  CO2 22 - 32 mmol/L 24 21(L) 22  Calcium 8.9 - 10.3 mg/dL 9.2 9.3 9.2  Total Protein 6.5 - 8.1 g/dL - - -  Total Bilirubin 0.3 - 1.2 mg/dL - - -  Alkaline Phos 38 - 126 U/L - - -  AST 15 - 41 U/L - - -  ALT 0 - 44 U/L - - -    Lab Results  Component Value Date   CALCIUM 9.2 01/19/2019   PHOS 5.2 (H) 01/19/2019       Component Value Date/Time   COLORURINE YELLOW 01/30/2019 0541   APPEARANCEUR CLOUDY (A) 01/23/2019 0541   LABSPEC 1.010 02/05/2019 0541   PHURINE 6.0 01/23/2019 0541   GLUCOSEU NEGATIVE 01/30/2019 0541   HGBUR LARGE (A) 02/09/2019 0541   BILIRUBINUR NEGATIVE 02/05/2019 0541   KETONESUR NEGATIVE 02/05/2019 0541   PROTEINUR 30 (A) 02/01/2019 0541   NITRITE NEGATIVE 01/26/2019 0541   LEUKOCYTESUR LARGE (A) 02/06/2019 0541   No  results found for: PHART, PCO2ART, PO2ART, HCO3, TCO2, ACIDBASEDEF, O2SAT     Component Value Date/Time   IRON 15 (L) 10/01/2018 0950   TIBC 136 (L) 10/01/2018 0950   FERRITIN 335 10/01/2018 0950   IRONPCTSAT 11 (L) 10/01/2018 0950       ASSESSMENT/PLAN:    85 year old male patient with a past medical history significant for hypertension, coronary artery disease, atrial fibrillation, BPH status post prostate surgery, 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 Jimmy Footman at [facility], CKD stage III-IV with a baseline serum creatinine around 1.8-2, renal cell carcinoma status post bilateral nephrectomies of his native kidneys, dementia and severe malnutrition who presents to the emergency department with abdominal pain, weakness, chest pain, and shortness of breath.  He was noted to have acute kidney injury likely secondary to ATN in the setting of septic shock from a urinary tract infection.  1.  Chronic kidney disease stage III-IV  with a baseline serum creatinine around 1.8-2.  He has underlying FSGS and is status post renal transplantation x2.  2.  Acute kidney injury.  Likely on the basis of ATN in the setting of septic shock.  Renal function is slowly improving.  Would avoid large amounts of protein intake with markedly elevated BUN.  Monitor closely for signs of uremia.  Currently no urgent indication for dialysis.  3.  Kidney transplant immunosuppression.  Received stress dose steroids with hydrocortisone due to septic shock and is now on oral prednisone.  Continue cyclosporine.  Myfortic was on hold due to sepsis - will restart at or prior to discharge.  Cyclosporine level is pending.  We will change cyclosporine to a liquid due to difficulty with pills.  4.  Septic shock due to UTI and pneumonia.  Improved.  On azithromycin and cefepime.  5.  Hyperkalemia.  Improved.  6.  Anemia due to chronic disease.  Transfuse PRN.  7.  Severe malnutrition and protein calorie malnutrition.  Continue protein supplements but would not be too aggressive in the setting of a markedly elevated BUN.  8.  Hyperphosphatemia.  Sevelamer added yesterday.  Will change to powder.   [place], DO, [place]

## 2019-01-19 NOTE — Progress Notes (Signed)
Subjective: Patient with improvement in temperature, he was happy to see [place] this morning although we woke him up.  He had more difficulty answering questions this morning that were not yes or no.    Objective:  Vital signs in last 24 hours: Vitals:   01/19/19 0441 01/19/19 0507 01/19/19 0816 01/19/19 1112  BP: (!) 148/91  (!) 148/88 140/80  Pulse: 85   (!) 54  Resp: 19  12 20   Temp: (!) 97.1 F (36.2 C)  98 F (36.7 C) (!) 97.5 F (36.4 C)  TempSrc: Axillary  Oral Oral  SpO2: 99%  98% 99%  Weight:  52.8 kg    Height:       Gen: arousable, sitting up in bed, no distress CV: irregular rhythm with normal rate, no murmurs Pulm: CTA in the anterior lung fields Abd: bs+, soft, non-distended, non-tender Ext: no edema   Assessment/Plan:  Principal Problem:   Sepsis ([place]) Active Problems:   BPH (benign prostatic hyperplasia)   Dementia with behavioral disturbance (HCC)   Renal failure   Pyelonephritis of transplanted kidney   CAP (community acquired pneumonia)   Adrenal insufficiency ([place])   Acute cystitis without hematuria  [name] [name] is an 85 yo man with a medical history of alzheimer's dementia, CKD IV s/p renal transplant in 1994 on chronic immunosuppression, CAD, atrial fibrillation, chronic anemia, BPH, urinary rentention, s/pcystolitholapaxy, rightorchiectomy, prostatectomy, and chronic hematuria who presented with sepsis in the setting of UTI and RUL pneumonia. He was hypothermic, hypotensive, and bradycardic on admission, but he is now hemodynamically stable.   Sepsis RUL pneumonia UTI Hypothermia Afebrile and hemodynamically stable. Leukocytosis resolved. More alert and interactive. Urine growing pseudomonas (sensitive to cefepime) and e coli. Blood cx without growth.  Pt is hypothermic again uncertain etiology at this time, bp has improved does not appear to be from infection, it could be that his temp dropped after d/c of stress dose steroids.   - Continue  cefepime and azithromycin (day 5) - Transitioned from stress dose steroids to his home PO prednisone 5mg  on 5/8, back on stress doses 5/9 due to hypothermia.    Acute on chronic renal failure Hx renal transplant 1994 Hx BPH and urinary retention  Baseline Cr 1.5-2.0. Cr 4.88 on admission. Remains elevated at 4.78 today. Likely ATN in the setting of sepsis and hypotension. Only 400cc urine in the past 24 hrs, foley in place. He is not a candidate for HD per nephro. He does not appear volume overloaded today, but will monitor closely while on IVF and holding lasix.  - Nephrology following, appreciate recs - Continue home cyclosporine - increased prednisone temporarily for stress response - Hold home mycophenolate - Continue home flomax - Hold home lasix - Continue IVF - Trend renal function  Pharyngeal and esophageal dysphagia: SLP eval showed dysphagia with probable aspiration events.  - Dysphagia 1 diet - Assistance with meals  Afib: Rate-controlled with diltiazem. Not on anticoagulation 2/2 prior GI bleed. HR normal - Holding diltiazem   Hyperkalemia: Resolved.  Acute on chronic normocytic anemia: Likely secondary to CKD, chronic hematuria without definite etiology despite cystoscopy and urological evaluation, and iron deficiency (iron level was 15 five months ago). Hb 8.3 on admission, trending down - Trend Hb. Transfuse if Hb <7 - will check iron studies, likely far enough out where we can supplement soon  CAD: Continue home aspirin Alzheimer's dementia: continue home memantine GOC: Patient is DNR  Dispo: Discharge pending improvement in renal function.  Katherine Roan, MD 01/19/2019, 12:23 PM

## 2019-01-20 LAB — CULTURE, BLOOD (ROUTINE X 2)
Culture: NO GROWTH
Culture: NO GROWTH

## 2019-01-20 LAB — RENAL FUNCTION PANEL
Albumin: 1.9 g/dL — ABNORMAL LOW (ref 3.5–5.0)
Albumin: 1.9 g/dL — ABNORMAL LOW (ref 3.5–5.0)
Anion gap: 12 (ref 5–15)
Anion gap: 16 — ABNORMAL HIGH (ref 5–15)
BUN: 170 mg/dL — ABNORMAL HIGH (ref 8–23)
BUN: 165 mg/dL — ABNORMAL HIGH (ref 8–23)
CO2: 23 mmol/L (ref 22–32)
CO2: 21 mmol/L — ABNORMAL LOW (ref 22–32)
Calcium: 9.6 mg/dL (ref 8.9–10.3)
Calcium: 9.7 mg/dL (ref 8.9–10.3)
Chloride: 105 mmol/L (ref 98–111)
Chloride: 104 mmol/L (ref 98–111)
Creatinine, Ser: 4.03 mg/dL — ABNORMAL HIGH (ref 0.61–1.24)
Creatinine, Ser: 4.05 mg/dL — ABNORMAL HIGH (ref 0.61–1.24)
GFR calc Af Amer: 14 mL/min — ABNORMAL LOW (ref >60)
GFR calc Af Amer: 14 mL/min — ABNORMAL LOW (ref >60)
GFR calc non Af Amer: 12 mL/min — ABNORMAL LOW (ref >60)
GFR calc non Af Amer: 12 mL/min — ABNORMAL LOW (ref >60)
Glucose, Bld: 132 mg/dL — ABNORMAL HIGH (ref 70–99)
Glucose, Bld: 105 mg/dL — ABNORMAL HIGH (ref 70–99)
Phosphorus: 5.4 mg/dL — ABNORMAL HIGH (ref 2.5–4.6)
Phosphorus: 4.7 mg/dL — ABNORMAL HIGH (ref 2.5–4.6)
Potassium: 4.7 mmol/L (ref 3.5–5.1)
Potassium: 4.6 mmol/L (ref 3.5–5.1)
Sodium: 140 mmol/L (ref 135–145)
Sodium: 141 mmol/L (ref 135–145)

## 2019-01-20 LAB — CBC
HCT: 26.1 % — ABNORMAL LOW (ref 39.0–52.0)
Hemoglobin: 8 g/dL — ABNORMAL LOW (ref 13.0–17.0)
MCH: 27.5 pg (ref 26.0–34.0)
MCHC: 30.7 g/dL (ref 30.0–36.0)
MCV: 89.7 fL (ref 80.0–100.0)
Platelets: 161 10*3/uL (ref 150–400)
RBC: 2.91 MIL/uL — ABNORMAL LOW (ref 4.22–5.81)
RDW: 18.9 % — ABNORMAL HIGH (ref 11.5–15.5)
WBC: 5.6 10*3/uL (ref 4.0–10.5)
nRBC: 0 % (ref 0.0–0.2)

## 2019-01-20 LAB — IRON AND TIBC
Iron: 42 ug/dL — ABNORMAL LOW (ref 45–182)
Saturation Ratios: 26 % (ref 17.9–39.5)
TIBC: 162 ug/dL — ABNORMAL LOW (ref 250–450)
UIBC: 120 ug/dL

## 2019-01-20 LAB — FERRITIN: Ferritin: 516 ng/mL — ABNORMAL HIGH (ref 24–336)

## 2019-01-20 NOTE — Progress Notes (Signed)
CSW acknowledges SNF requests. Left voicemail for patient's daughter.   Percell Locus Dannielle Baskins LCSW [phone]

## 2019-01-20 NOTE — Care Management Important Message (Signed)
Important Message  Patient Details  Name: [name] [name] MRN: [id number] Date of Birth: 12/26/1927   Medicare Important Message Given:  Yes    [name] Montine Circle 01/20/2019, 4:26 PM

## 2019-01-20 NOTE — Progress Notes (Signed)
Subjective: No overnight events. [name] [name] is sitting up in bed this morning with a half-eaten soft breakfast in front of him. He states he's having difficulty eating because he can't find his teeth. He says he feels well. No acute concerns.   Objective:  Vital signs in last 24 hours: Vitals:   01/19/19 0507 01/19/19 0816 01/19/19 1112 01/19/19 1550  BP:  (!) 148/88 140/80 (!) 167/89  Pulse:   (!) 54 75  Resp:  12 20 15   Temp:  98 F (36.7 C) (!) 97.5 F (36.4 C) 97.6 F (36.4 C)  TempSrc:  Oral Oral Oral  SpO2:  98% 99% 94%  Weight: 52.8 kg     Height:       Gen: awake and alert, no distress CV: RRR, no murmurs Pulm: CTAB Abd: bs+, soft, non-distended, non-tender Ext: no edema   Assessment/Plan:  Principal Problem:   Sepsis ([place]) Active Problems:   BPH (benign prostatic hyperplasia)   Dementia with behavioral disturbance (HCC)   Renal failure   Pyelonephritis of transplanted kidney   CAP (community acquired pneumonia)   Adrenal insufficiency (HCC)   Acute cystitis without hematuria   Weakness  [name] [name] is an 85 yo man with a medical history ofalzheimer's dementia, CKD IV s/p renal transplant in 1994 on chronic immunosuppression, CAD, atrial fibrillation, chronic anemia, BPH, urinary rentention, s/pcystolitholapaxy, rightorchiectomy, prostatectomy,and chronic hematuriawho presented with sepsis in the setting of UTI and RUL pneumonia.He was hypothermic, hypotensive, and bradycardic on admission, but he is now hemodynamically stable.  Sepsis RULpneumonia UTI Hypothermia Afebrile and hemodynamically stable. Leukocytosis resolved. More alert and interactive. Urine cx grew pseudomonas (sensitive to cefepime) and e coli. Negative blood cx. Hypothermia resolved after increasing home prednisone dose back to stress dose (5mg  to 20mg ). Completed 5 days of azithromycin. Will continue cefepime for PNA and Pseudomonas UTI.  - Continue cefepime (day 6) - Continue stress  dose prednisone with taper (20mg  x3 days, 15mg  x3 days, 10mg  x3 days, 5mg  chronically) - PT eval  Acute on chronic renal failure Hx renal transplant 1994 Hx BPH and urinary retention  Baseline Cr 1.5-2.0. Cr 4.88 on admission. Has gradually improved, is 4.03 today. Likely ATN in the setting of sepsis and hypotension. Urine output has improved greatly, put out 1.7L yesterday. He is not a candidate for HD per nephro.He does not appear volume overloaded today, but will monitor closely while holding lasix. Cyclosporine level therapeutic at 207. - Nephrology following, appreciate recs - Continue home cyclosporine - Increased prednisone temporarily for stress response, will taper back to home dose - Hold home mycophenolate - Continue home flomax - Hold home lasix - IVF expired overnight, will defer whether to continue IVF to nephro - Trend renal function  Pharyngeal and esophageal dysphagia: SLP eval showed dysphagia with probable aspiration events. Patient has fluctuating mental status. Sometimes he can swallow pills without issue, other times he cannot.  - Dysphagia 1 diet - Assistance with meals - Liquid cyclosporine. Make other meds solutions prn.  Afib: Rate-controlled with diltiazem. Not on anticoagulation 2/2 prior GI bleed. HR normal - Holding diltiazem   Acute on chronic normocytic anemia: Likely secondary to CKD,chronic hematuria without definite etiology despite cystoscopy and urological evaluation, and iron deficiency. Hb 8.3 on admission, 8 today. Iron mildly low at 42 (improved from 15 three months ago), does not need iron supplementation. - Trend Hb. Transfuse if Hb <7  CAD: Continue home aspirin Alzheimer's dementia:continue home memantine GOC: Patient is DNR  Dispo: Anticipated discharge pending PT eval  Dorrell, Andree Elk, MD 01/20/2019, 6:24 AM Pager: [phone]

## 2019-01-20 NOTE — NC FL2 (Signed)
[place] LEVEL OF CARE SCREENING TOOL     IDENTIFICATION  Patient Name: [name] [name] Birthdate: 05-08-1928 Sex: male Admission Date (Current Location): 01/22/2019  [facility] and [place] Number:  Herbalist and Address:  [facility], [place] [address] [place], [place] [address]      Provider Number: [id number]  Attending Physician Name and Address:  Oda Kilts, MD  Relative Name and Phone Number:  [name], daughter, [phone]    Current Level of Care: Hospital Recommended Level of Care: [place] Prior Approval Number:    Date Approved/Denied:   PASRR Number: [id number]  Discharge Plan: SNF    Current Diagnoses: Patient Active Problem List   Diagnosis Date Noted  . Weakness   . Adrenal insufficiency ([place])   . Acute cystitis without hematuria   . Renal failure 02/04/2019  . Sepsis ([place]) 01/23/2019  . Pyelonephritis of transplanted kidney 01/14/2019  . CAP (community acquired pneumonia) 01/24/2019  . Alzheimer disease ([place]) 10/31/2018  . Retention of urine   . Acute lower UTI   . Hypoalbuminemia   . Dementia with behavioral disturbance ([place])   . Palliative care by specialist   . Advanced care planning/counseling discussion   . Goals of care, counseling/discussion   . Pressure injury of skin 10/01/2018  . ARF (acute renal failure) ([place]) 09/30/2018  . Bone lesion 03/27/2018  . AAA (abdominal aortic aneurysm) ([place]) 03/27/2018  . BPH (benign prostatic hyperplasia) 03/27/2018  . Weight loss 03/27/2018  . Hypotension 03/27/2018  . Unspecified atrial fibrillation ([place]) 03/27/2018  . Protein-calorie malnutrition, severe 03/27/2018  . SIRS (systemic inflammatory response syndrome) (HCC) 03/26/2018    Orientation RESPIRATION BLADDER Height & Weight     Self  Normal Incontinent, Indwelling catheter Weight: 116 lb 6.5 oz (52.8 kg) Height:  5\' 9"  (175.3 cm)  BEHAVIORAL SYMPTOMS/MOOD NEUROLOGICAL  BOWEL NUTRITION STATUS  Other (Comment)(Dementia)   Continent Diet(Please see DC Summary)  AMBULATORY STATUS COMMUNICATION OF NEEDS Skin   Extensive Assist Verbally PU Stage and Appropriate Care(Stage II on coccyx; closed incision on scrotum)                       Personal Care Assistance Level of Assistance  Bathing, Feeding, Dressing Bathing Assistance: Maximum assistance Feeding assistance: Limited assistance Dressing Assistance: Limited assistance     Functional Limitations Info  Sight, Hearing, Speech Sight Info: Impaired Hearing Info: Adequate Speech Info: Adequate    SPECIAL CARE FACTORS FREQUENCY  PT (By licensed PT), OT (By licensed OT), Speech therapy     PT Frequency: 5x/week OT Frequency: 3x/week     Speech Therapy Frequency: 3x/week      Contractures Contractures Info: Not present    Additional Factors Info  Code Status, Allergies Code Status Info: DNR Allergies Info: Ciprofloxacin, Clindamycin/lincomycin, Levofloxacin, Septra Sulfamethoxazole-trimethoprim           Current Medications (01/20/2019):  This is the current hospital active medication list Current Facility-Administered Medications  Medication Dose Route Frequency Provider Last Rate Last Dose  . acetaminophen (TYLENOL) tablet 650 mg  650 mg Oral Q6H PRN Velna Ochs, MD   650 mg at 01/17/19 2127   Or  . acetaminophen (TYLENOL) suppository 650 mg  650 mg Rectal Q6H PRN Velna Ochs, MD      . aspirin chewable tablet 81 mg  81 mg Oral Daily Velna Ochs, MD   81 mg at 01/20/19 0915  . ceFEPIme (MAXIPIME) 500  mg in dextrose 5 % 50 mL IVPB  500 mg Intravenous Q24H Velna Ochs, MD 100 mL/hr at 01/20/19 0552 500 mg at 01/20/19 0552  . cycloSPORINE (NEORAL) 100 MG/ML microemulsion solution 125 mg  125 mg Oral BID Finnigan, Nancy A, DO   125 mg at 01/20/19 0916  . docusate sodium (COLACE) capsule 200 mg  200 mg Oral QHS Velna Ochs, MD   200 mg at 01/19/19 2115  .  enoxaparin (LOVENOX) injection 30 mg  30 mg Subcutaneous Q24H Velna Ochs, MD   30 mg at 01/19/19 2115  . famotidine (PEPCID) tablet 20 mg  20 mg Oral QHS Velna Ochs, MD   20 mg at 01/19/19 2111  . feeding supplement (ENSURE ENLIVE) (ENSURE ENLIVE) liquid 237 mL  237 mL Oral TID BM Velna Ochs, MD   237 mL at 01/20/19 0925  . feeding supplement (PRO-STAT SUGAR FREE 64) liquid 30 mL  30 mL Oral TID BM Oda Kilts, MD   30 mL at 01/20/19 0916  . memantine (NAMENDA) tablet 10 mg  10 mg Oral BID Velna Ochs, MD   10 mg at 01/20/19 0915  . multivitamin with minerals tablet 1 tablet  1 tablet Oral Daily Oda Kilts, MD   1 tablet at 01/20/19 0915  . predniSONE (DELTASONE) tablet 20 mg  20 mg Oral Q breakfast Katherine Roan, MD   20 mg at 01/20/19 0916  . sevelamer carbonate (RENVELA) packet 0.8 g  0.8 g Oral TID WC Finnigan, Nancy A, DO   0.8 g at 01/20/19 0916  . sodium zirconium cyclosilicate (LOKELMA) packet 5 g  5 g Oral Once Velna Ochs, MD      . tamsulosin [facility]) capsule 0.4 mg  0.4 mg Oral QPC supper Velna Ochs, MD   0.4 mg at 01/19/19 1719     Discharge Medications: Please see discharge summary for a list of discharge medications.  Relevant Imaging Results:  Relevant Lab Results:   Additional Information SSN: [id number]          COVID negative on 01/10/2019  Benard Halsted, LCSW

## 2019-01-20 NOTE — Progress Notes (Signed)
Physical Therapy Treatment Patient Details Name: [name] [name] MRN: [id number] DOB: Jan 13, 1928 Today's Date: 01/20/2019    History of Present Illness Pt is a 85 y.o. M with significant PMH of alzheimer's dementia, CKD IV s/p renal transplant, CAD, atrial fibrillation, who presents with sepsis in setting of UTI and RUL pneumonia.     PT Comments    Pt performed gait training and functional mobility.  Today he was able to progress to gait training with close chair follow.  Continue to recommend SNF placement at this time. Pt seated in recliner with chair alarm set at conclusion of session.      Follow Up Recommendations  SNF;Supervision/Assistance - 24 hour     Equipment Recommendations  None recommended by PT    Recommendations for Other Services       Precautions / Restrictions Precautions Precautions: Fall Restrictions Weight Bearing Restrictions: No    Mobility  Bed Mobility Overal bed mobility: Needs Assistance Bed Mobility: Supine to Sit;Sit to Supine     Supine to sit: Mod assist;+2 for safety/equipment     General bed mobility comments: Pt required assistance to advance LEs to edge of bed and to elevate trunk into sitting.  ONce seated able to scoot to edge of bed forward unassisted.    Transfers Overall transfer level: Needs assistance Equipment used: Rolling walker (2 wheeled) Transfers: Sit to/from Stand Sit to Stand: Mod assist;+2 safety/equipment         General transfer comment: Cues for hand placement to and from seated surface.  Pt holding device to stand and require hand over hand guidance to reach back for recliner before returning to seated surface.    Ambulation/Gait Ambulation/Gait assistance: Mod assist;+2 safety/equipment Gait Distance (Feet): 20 Feet Assistive device: Rolling walker (2 wheeled) Gait Pattern/deviations: Step-through pattern;Trunk flexed;Shuffle     General Gait Details: Cues for forward gaze and upper trunk control.  Pt  fatigues quickly and required close chair follow.  Pt with shuffling pattern and difficulty turning device.     Stairs             Wheelchair Mobility    Modified Rankin (Stroke Patients Only)       Balance Overall balance assessment: Needs assistance   Sitting balance-Leahy Scale: Fair       Standing balance-Leahy Scale: Poor                              Cognition Arousal/Alertness: Awake/alert Behavior During Therapy: Flat affect Overall Cognitive Status: History of cognitive impairments - at baseline                                 General Comments: history of dementia      Exercises      General Comments        Pertinent Vitals/Pain Pain Assessment: Faces Faces Pain Scale: Hurts little more Pain Location: generalized Pain Descriptors / Indicators: Discomfort Pain Intervention(s): Limited activity within patient's tolerance    Home Living                      Prior Function            PT Goals (current goals can now be found in the care plan section) Acute Rehab PT Goals Patient Stated Goal: none stated by pt; pt daughter would like pt to be back  at baseline Potential to Achieve Goals: Fair Progress towards PT goals: Progressing toward goals    Frequency    Min 3X/week      PT Plan Current plan remains appropriate    Co-evaluation              AM-PAC PT "6 Clicks" Mobility   Outcome Measure  Help needed turning from your back to your side while in a flat bed without using bedrails?: A Lot Help needed moving from lying on your back to sitting on the side of a flat bed without using bedrails?: A Lot Help needed moving to and from a bed to a chair (including a wheelchair)?: A Lot Help needed standing up from a chair using your arms (e.g., wheelchair or bedside chair)?: A Lot Help needed to walk in hospital room?: A Lot Help needed climbing 3-5 steps with a railing? : Total 6 Click Score:  11    End of Session Equipment Utilized During Treatment: Gait belt Activity Tolerance: Patient limited by fatigue Patient left: in bed;with call bell/phone within reach;with bed alarm set Nurse Communication: Mobility status PT Visit Diagnosis: Unsteadiness on feet (R26.81);Other abnormalities of gait and mobility (R26.89);Muscle weakness (generalized) (M62.81)     Time: 5170-0174 PT Time Calculation (min) (ACUTE ONLY): 19 min  Charges:  $Gait Training: 8-22 mins                     Governor Rooks, PTA Acute Rehabilitation Services Pager [phone] Office [phone]     Wyvonne Carda Eli Hose 01/20/2019, 3:22 PM

## 2019-01-20 NOTE — Progress Notes (Signed)
  Date: 01/20/2019  Patient name: [name] [name]  Medical record number: [id number]  Date of birth: April 20, 1928   I have seen and evaluated this patient and I have discussed the plan of care with the house staff. Please see their note for complete details. I concur with their findings with the following additions/corrections:   Doing a little better today, appears more energetic and interactive.  No further hypothermia.  Has completed 5-day course of azithromycin, will continue cefepime for now, anticipate 5-7 days of antibiotics after clinical stability (will use 5/10). If doing well, may consider transition to oral ciprofloxacin for the last few days of treatment, though concern for side effects is high in this frail and medically complex elderly man.   Lenice Pressman, M.D., Ph.D. 01/20/2019, 12:07 PM

## 2019-01-20 NOTE — Progress Notes (Signed)
[facility]    NEPHROLOGY PROGRESS NOTE  SUBJECTIVE: Patient resting without complaints.  Reports an excellent appetite.  No chest pain, shortness of breath, nausea, vomiting, diarrhea or dysuria.  Denies any dysgeusia.  Some difficulty tolerating pills.  All other review of systems are negative.   OBJECTIVE:  Vitals:   01/20/19 0842 01/20/19 0915  BP: (!) 155/103 (!) 132/92  Pulse:  (!) 38  Resp: 15 15  Temp: (!) 97.3 F (36.3 C)   SpO2:  100%    Intake/Output Summary (Last 24 hours) at 01/20/2019 1145 Last data filed at 01/20/2019 0933 Gross per 24 hour  Intake 420 ml  Output 1900 ml  Net -1480 ml      General:  AAOx3 NAD HEENT: MMM [place] AT anicteric sclera Neck:  No JVD, no adenopathy CV:  Heart RRR, no rub Lungs:  L/S CTA bilaterally Abd:  abd SNT/ND with normal BS GU:  Bladder non-palpable Extremities:  No LE edema. Skin:  No skin rash Neuro: No asterixis  MEDICATIONS:  . aspirin  81 mg Oral Daily  . ceFEPime (MAXIPIME) IV  500 mg Intravenous Q24H  . cycloSPORINE  125 mg Oral BID  . docusate sodium  200 mg Oral QHS  . enoxaparin (LOVENOX) injection  30 mg Subcutaneous Q24H  . famotidine  20 mg Oral QHS  . feeding supplement (ENSURE ENLIVE)  237 mL Oral TID BM  . feeding supplement (PRO-STAT SUGAR FREE 64)  30 mL Oral TID BM  . memantine  10 mg Oral BID  . multivitamin with minerals  1 tablet Oral Daily  . predniSONE  20 mg Oral Q breakfast  . sevelamer carbonate  0.8 g Oral TID WC  . sodium zirconium cyclosilicate  5 g Oral Once  . tamsulosin  0.4 mg Oral QPC supper       LABS:   CBC Latest Ref Rng & Units 01/20/2019 01/19/2019 01/18/2019  WBC 4.0 - 10.5 K/uL 5.6 5.4 5.8  Hemoglobin 13.0 - 17.0 g/dL 8.0(L) 7.1(L) 7.5(L)  Hematocrit 39.0 - 52.0 % 26.1(L) 22.3(L) 23.3(L)  Platelets 150 - 400 K/uL 161 162 179    CMP Latest Ref Rng & Units 01/20/2019 01/19/2019 01/18/2019  Glucose 70 - 99 mg/dL 132(H) 96 102(H)  BUN 8 - 23 mg/dL 170(H) 177(H)  182(H)  Creatinine 0.61 - 1.24 mg/dL 4.03(H) 4.41(H) 4.52(H)  Sodium 135 - 145 mmol/L 140 140 139  Potassium 3.5 - 5.1 mmol/L 4.7 4.7 4.7  Chloride 98 - 111 mmol/L 105 102 102  CO2 22 - 32 mmol/L 23 24 21(L)  Calcium 8.9 - 10.3 mg/dL 9.6 9.2 9.3  Total Protein 6.5 - 8.1 g/dL - - -  Total Bilirubin 0.3 - 1.2 mg/dL - - -  Alkaline Phos 38 - 126 U/L - - -  AST 15 - 41 U/L - - -  ALT 0 - 44 U/L - - -    Lab Results  Component Value Date   PTH 29 01/18/2019   CALCIUM 9.6 01/20/2019   PHOS 5.4 (H) 01/20/2019       Component Value Date/Time   COLORURINE YELLOW 02/05/2019 0541   APPEARANCEUR CLOUDY (A) 01/27/2019 0541   LABSPEC 1.010 02/05/2019 0541   PHURINE 6.0 01/13/2019 0541   GLUCOSEU NEGATIVE 01/22/2019 0541   HGBUR LARGE (A) 01/20/2019 0541   BILIRUBINUR NEGATIVE 02/04/2019 [place] 01/18/2019 0541   PROTEINUR 30 (A) 01/29/2019 0541   NITRITE NEGATIVE 02/09/2019 0541   LEUKOCYTESUR  LARGE (A) 01/12/2019 0541   No results found for: PHART, PCO2ART, PO2ART, HCO3, TCO2, ACIDBASEDEF, O2SAT     Component Value Date/Time   IRON 42 (L) 01/20/2019 0314   TIBC 162 (L) 01/20/2019 0314   FERRITIN 516 (H) 01/20/2019 0314   IRONPCTSAT 26 01/20/2019 0314       ASSESSMENT/PLAN:    85 year old male patient with a past medical history significant for hypertension, coronary artery disease, atrial fibrillation, BPH status post prostate surgery, 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 Jimmy [facility] at [facility], CKD stage III-IV with a baseline serum creatinine around 1.8-2, renal cell carcinoma status post bilateral nephrectomies of his native kidneys, dementia and severe malnutrition who presents to the emergency department with abdominal pain, weakness, chest pain, and shortness of breath.  He was noted to have acute kidney injury likely secondary to ATN in the setting of septic shock from a urinary tract infection.  1.  Chronic  kidney disease stage III-IV with a baseline serum creatinine around 1.8-2.  He has underlying FSGS and is status post renal transplantation x2.  2.  Acute kidney injury.  Likely on the basis of ATN in the setting of septic shock.  Renal function is slowly improving.  Would avoid large amounts of protein intake with markedly elevated BUN.  Monitor closely for signs of uremia.  Currently no urgent indication for dialysis.    3.  Kidney transplant immunosuppression.  Received stress dose steroids with hydrocortisone due to septic shock and is now on oral prednisone.  Continue cyclosporine.  Myfortic was on hold due to sepsis - will restart at or prior to discharge.  Cyclosporine level reordered 01/20/19 - may need dose adjustment with cardizem discontinuation.  We will change cyclosporine to a liquid due to difficulty with pills.  4.  Septic shock due to UTI and pneumonia.  Improved.  On azithromycin and cefepime.  Would recommend void trial today.  5.  Hyperkalemia.  Improved.  6.  Anemia due to chronic disease.  Transfuse PRN.  7.  Severe malnutrition and protein calorie malnutrition.  Continue protein supplements but would not be too aggressive in the setting of a markedly elevated BUN.  8.  Hyperphosphatemia.  Sevelamer added.  Will change to powder.   [place], DO, [place]

## 2019-01-20 NOTE — Care Management Important Message (Signed)
Important Message  Patient Details  Name: [name] [name] MRN: [id number] Date of Birth: 1928/01/20   Medicare Important Message Given:  Yes    Elwanda Moger Montine Circle 01/20/2019, 3:50 PM

## 2019-01-20 NOTE — Consult Note (Signed)
   [facility] CM Inpatient Consult   01/20/2019  [name] [name] [birth year] [id number]    Patient screened for high risk score[23%] for unplanned readmissionandhospitalization under his Medicare plan, and to check if potential [place] Management services are needed.  Noted [facility] community care management attempt to outreach patient in the past for EMMI calls follow-up but unable to engage.  Per chart review and history and physical dated 01/14/2019 reveal as: Patient is a very pleasant 85 y/o M with extensive past medical history of alzheimer's dementia, CKD IV s/p renal transplant in 1994 on chronic immunosuppression, CAD, atrial fibrillation, chronic anemia, BPH, urinary rentention, s/p cystolitholapaxy, right orchiectomy, reported history of prostatectomy (no records) and chronic hematuria; presented from home with multiple complaints of altered mental status, generalized weakness, dysuria, chest pain, and shortness of breath. Patient lives at home with his daughter. Patient is being followed by [facility]) prior to admission.  Primary Care Provider listed is Dr. Jeneen Rinks Deterding with [place] and Dr. Lyman Bishop (cardiology).  Called with attempts to speak with patient over the hospital room phone but was no reply.  Current review of disposition per PT and TOC SW note, show that the patient is being recommended to skilled nursing facility (SNF).  Will follow disposition. If there are changes in disposition or needs for community follow-up, please place a [place] Management consult as appropriate.   Of note, [facility] Care Management services does not replace or interfere with any services that are arranged by transition of care case management or social work.    For questions and additional information, please call:  Crytal Pensinger A. Queen Abbett, BSN, RN-BC [facility] Liaison Cell: [phone]

## 2019-01-20 NOTE — TOC Initial Note (Signed)
Transition of Care [facility]) - Initial/Assessment Note    Patient Details  Name: [name] [name] MRN: [id number] Date of Birth: 1928/07/02  Transition of Care [facility]) CM/SW Contact:    Benard Halsted, LCSW Phone Number: 01/20/2019, 5:36 PM  Clinical Narrative:                 Patient's son contacted CSW in his sister's stead. CSW spoke with him regarding PT recommendation of SNF placement at time of discharge. Patient's son reported that patient resides with his sister here in [place] while he lives in [place]. They are currently declining SNF placement due to wanting to keep patient at home as their mother did before she passed away. He is requesting home health services through [facility] (patient had the [facility] program earlier in the year) and a wheelchair for patient. He states he is inquiring about getting patient's daughter extra help in the house to decrease her caregiver burden. Patient's son stated that he appreciated the hospital's care and expressed being hopeful for patient to feel better soon. No further questions reported at this time. CSW to continue to follow and assist with discharge planning needs.   Expected Discharge Plan: [place] Barriers to Discharge: Continued Medical Work up   Patient Goals and CMS Choice Patient states their goals for this hospitalization and ongoing recovery are:: Return home CMS Medicare.gov Compare Post Acute Care list provided to:: Other (Comment Required)(Son) Choice offered to / list presented to : Adult Children  Expected Discharge Plan and Services Expected Discharge Plan: [place] In-house Referral: Clinical Social Work Discharge Planning Services: NA Post Acute Care Choice: Home Health, Durable Medical Equipment Living arrangements for the past 2 months: [place]                                      Prior Living Arrangements/Services Living arrangements for the past 2 months:  Single Family Home Lives with:: Adult Children Patient language and need for interpreter reviewed:: Yes Do you feel safe going back to the place where you live?: Yes      Need for Family Participation in Patient Care: Yes (Comment) Care giver support system in place?: Yes (comment)   Criminal Activity/Legal Involvement Pertinent to Current Situation/Hospitalization: No - Comment as needed  Activities of Daily Living Home Assistive Devices/Equipment: None ADL Screening (condition at time of admission) Patient's cognitive ability adequate to safely complete daily activities?: Yes Is the patient deaf or have difficulty hearing?: No Does the patient have difficulty seeing, even when wearing glasses/contacts?: No Does the patient have difficulty concentrating, remembering, or making decisions?: Yes Patient able to express need for assistance with ADLs?: Yes Does the patient have difficulty dressing or bathing?: Yes Independently performs ADLs?: No Communication: Needs assistance Is this a change from baseline?: Pre-admission baseline Dressing (OT): Dependent Is this a change from baseline?: Pre-admission baseline Grooming: Dependent Is this a change from baseline?: Pre-admission baseline Feeding: Dependent Is this a change from baseline?: Pre-admission baseline Bathing: Dependent Is this a change from baseline?: Pre-admission baseline Toileting: Dependent Is this a change from baseline?: Pre-admission baseline In/Out Bed: Dependent Is this a change from baseline?: Pre-admission baseline Walks in Home: Dependent Is this a change from baseline?: Pre-admission baseline Does the patient have difficulty walking or climbing stairs?: Yes Weakness of Legs: Both Weakness of Arms/Hands: Both  Permission Sought/Granted Permission  sought to share information with : Facility Sport and exercise psychologist, Family Supports Permission granted to share information with : No  Share Information with NAME:  Sarita/Geddy  Permission granted to share info w AGENCY: [facility]  Permission granted to share info w Relationship: Daughter/Son  Permission granted to share info w Contact Information: [phone]  Emotional Assessment Appearance:: Appears stated age Attitude/Demeanor/Rapport: Unable to Assess Affect (typically observed): Unable to Assess Orientation: : Oriented to Self Alcohol / Substance Use: Not Applicable Psych Involvement: No (comment)  Admission diagnosis:  Renal failure [N19] Weakness [R53.1] Acute cystitis without hematuria [N30.00] Community acquired pneumonia of right upper lobe of lung ([place]) [J18.1] Patient Active Problem List   Diagnosis Date Noted  . Weakness   . Adrenal insufficiency ([place])   . Acute cystitis without hematuria   . Renal failure 01/28/2019  . Sepsis ([place]) 01/31/2019  . Pyelonephritis of transplanted kidney 02/09/2019  . CAP (community acquired pneumonia) 01/27/2019  . Alzheimer disease ([place]) 10/31/2018  . Retention of urine   . Acute lower UTI   . Hypoalbuminemia   . Dementia with behavioral disturbance ([place])   . Palliative care by specialist   . Advanced care planning/counseling discussion   . Goals of care, counseling/discussion   . Pressure injury of skin 10/01/2018  . ARF (acute renal failure) ([place]) 09/30/2018  . Bone lesion 03/27/2018  . AAA (abdominal aortic aneurysm) ([place]) 03/27/2018  . BPH (benign prostatic hyperplasia) 03/27/2018  . Weight loss 03/27/2018  . Hypotension 03/27/2018  . Unspecified atrial fibrillation ([place]) 03/27/2018  . Protein-calorie malnutrition, severe 03/27/2018  . SIRS (systemic inflammatory response syndrome) (HCC) 03/26/2018   PCP:  Mauricia Area, MD Pharmacy:   [facility] [address], [place] [address] Phone: [phone] Fax: [phone]     Social Determinants of Health (SDOH) Interventions    Readmission Risk  Interventions Readmission Risk Prevention Plan 01/20/2019  Transportation Screening Complete  PCP or Specialist Appt within 3-5 Days Complete  HRI or [place] Complete  Social Work Consult for [place] Planning/Counseling Complete  Palliative Care Screening Not Applicable  Medication Review Press photographer) Complete  Some recent data might be hidden

## 2019-01-21 ENCOUNTER — Encounter (HOSPITAL_COMMUNITY): Payer: Self-pay

## 2019-01-21 ENCOUNTER — Telehealth: Payer: Self-pay | Admitting: Licensed Clinical Social Worker

## 2019-01-21 DIAGNOSIS — R680 Hypothermia, not associated with low environmental temperature: Secondary | ICD-10-CM

## 2019-01-21 LAB — RENAL FUNCTION PANEL
Albumin: 2 g/dL — ABNORMAL LOW (ref 3.5–5.0)
Anion gap: 13 (ref 5–15)
BUN: 168 mg/dL — ABNORMAL HIGH (ref 8–23)
CO2: 22 mmol/L (ref 22–32)
Calcium: 9.8 mg/dL (ref 8.9–10.3)
Chloride: 107 mmol/L (ref 98–111)
Creatinine, Ser: 4.06 mg/dL — ABNORMAL HIGH (ref 0.61–1.24)
GFR calc Af Amer: 14 mL/min — ABNORMAL LOW (ref >60)
GFR calc non Af Amer: 12 mL/min — ABNORMAL LOW (ref >60)
Glucose, Bld: 102 mg/dL — ABNORMAL HIGH (ref 70–99)
Phosphorus: 5 mg/dL — ABNORMAL HIGH (ref 2.5–4.6)
Potassium: 4.6 mmol/L (ref 3.5–5.1)
Sodium: 142 mmol/L (ref 135–145)

## 2019-01-21 LAB — CBC
HCT: 25.7 % — ABNORMAL LOW (ref 39.0–52.0)
Hemoglobin: 8 g/dL — ABNORMAL LOW (ref 13.0–17.0)
MCH: 28 pg (ref 26.0–34.0)
MCHC: 31.1 g/dL (ref 30.0–36.0)
MCV: 89.9 fL (ref 80.0–100.0)
Platelets: 126 10*3/uL — ABNORMAL LOW (ref 150–400)
RBC: 2.86 MIL/uL — ABNORMAL LOW (ref 4.22–5.81)
RDW: 19 % — ABNORMAL HIGH (ref 11.5–15.5)
WBC: 8.3 10*3/uL (ref 4.0–10.5)
nRBC: 0 % (ref 0.0–0.2)

## 2019-01-21 MED ORDER — POLYETHYLENE GLYCOL 3350 17 G PO PACK
17.0000 g | PACK | Freq: Every day | ORAL | Status: DC | PRN
  Administered 2019-01-21 – 2019-01-22 (×2): 17 g via ORAL
  Filled 2019-01-21 (×2): qty 1

## 2019-01-21 MED ORDER — HYDROCORTISONE NA SUCCINATE PF 100 MG IJ SOLR
50.0000 mg | Freq: Two times a day (BID) | INTRAMUSCULAR | Status: DC
  Administered 2019-01-21 – 2019-01-24 (×6): 50 mg via INTRAVENOUS
  Filled 2019-01-21 (×6): qty 2

## 2019-01-21 NOTE — Progress Notes (Signed)
Voicemail left with [name] (daughter) to discuss goals of care. Will attempt to contact again tomorrow for further discussion.   Alda Lea, AGPCNP-BC Palliative Medicine Team  Phone: [phone] Pager: [phone] Amion: Bjorn Pippin

## 2019-01-21 NOTE — Progress Notes (Signed)
Subjective: Yesterday, overnight, and now this morning, [name] [name] has experienced hypothermia with temperatures ranging from 94.8-96.6. [name] [name] is awake and alert this morning. He states that he hasn't had much to eat or drink, but he doesn't feel thirsty. He drinks some protein drink with the team at bedside and seems to swallow without issue.   Objective:  Vital signs in last 24 hours: Vitals:   01/20/19 0915 01/20/19 1401 01/20/19 1600 01/21/19 0100  BP: (!) 132/92 (!) 163/113 106/86   Pulse: 82     Resp: 15 13 14    Temp: (!) 97.3 F (36.3 C)  (!) 94.8 F (34.9 C) (!) 96.6 F (35.9 C)  TempSrc: Oral  Rectal   SpO2: 100% 100% 100%   Weight:      Height:       Gen: awake and alert, no distress CV: RRR, no murmurs Pulm: CTA in the anterior fields, normal effort on room air Abd: bs+, soft, non-distended, non-tender Ext: no edema  Assessment/Plan:  Principal Problem:   Sepsis (HCC) Active Problems:   BPH (benign prostatic hyperplasia)   Dementia with behavioral disturbance (HCC)   Renal failure   Pyelonephritis of transplanted kidney   CAP (community acquired pneumonia)   Adrenal insufficiency ([place])   Acute cystitis without hematuria   Weakness  [name] [name] is an 85 yo man with a medical history ofalzheimer's dementia, CKD IV s/p renal transplant in 1994 on chronic immunosuppression, CAD, atrial fibrillation, chronic anemia, BPH, urinary rentention, s/pcystolitholapaxy, rightorchiectomy, prostatectomy,and chronic hematuriawho presented with sepsis in the setting of UTI and RUL pneumonia.He was hypothermic, hypotensive, and bradycardic on admission, but he is now hemodynamically stable.  Sepsis RULpneumonia UTI Hypothermia Was hypothermic yesterday and throughout the night, with temperatures as low as 94.8, despite continued stress dose steroids. Otherwise hemodynamically stable. Urine cx grew pseudomonas (sensitive to cefepime) and e coli. Negative blood cx.  Completed 5 days of azithromycin. Will continue cefepime for PNA and Pseudomonas UTI for 5-7 days after clinical improvement. - Continue cefepime (day 7) - Received 20mg  prednisone this morning, will start IV hydrocortisone this evening for escalation of stress dose steroids  Acute on chronic renal failure Hx renal transplant 1994 Hx BPH and urinary retention  Baseline Cr 1.5-2.0. Cr 4.88 on admission. Has gradually improved, is 4 today (stable from yesterday). Likely ATN in the setting of sepsis and hypotension. Removed foley cath yesterday for voiding trial. Pt urinated 700cc without foley. He is not a candidate for HD per nephro. - Nephrology following, appreciate recs - Continue home cyclosporine -Increased prednisone temporarily for stress response, starting IV hydrocortisone this evening - Hold home mycophenolate (resume at discharge) - Continue home flomax - Hold home lasix - Bladder scan to rule out urinary retention - Trend renal function - Holding IVF per nephro  GOC: Patient is DNR. PT recommending SNF. Patient's son and daughter prefer that he pass at home when his time comes. They are working to increase their home health support. He may benefit from home hospice. - Palliative care consult  Pharyngeal and esophageal dysphagia: SLP eval showed dysphagia with probable aspiration events. Patient has fluctuating mental status. Sometimes he can swallow pills without issue, other times he cannot.  - Dysphagia 1 diet - Assistance with meals - Liquid cyclosporine. Make other meds solutions prn.  Afib: Rate-controlled with diltiazem. Not on anticoagulation 2/2 prior GI bleed. HRnormal. - Holding diltiazem   Acute on chronic normocytic anemia: Likely secondary to CKD,chronic hematuria without definite  etiology despite cystoscopy and urological evaluation, and iron deficiency. Hb 8.3 on admission, 8 today. Iron mildly low, does not need iron supplementation. - Trend Hb.  Transfuse if Hb <7  CAD: Continue home aspirin Alzheimer's dementia:continue home memantine  Dispo: Anticipated discharge pending clinical improvement.  [name] [name], Andree Elk, MD 01/21/2019, 6:36 AM Pager: [phone]

## 2019-01-21 NOTE — Telephone Encounter (Signed)
Palliative Care SW left a vm with patient's daughter, [name], to inquire about patient status and discharge plan from the hospital.

## 2019-01-21 NOTE — Progress Notes (Signed)
  Date: 01/21/2019  Patient name: [name] [name]  Medical record number: [id number]  Date of birth: 1927-11-23   I have seen and evaluated this patient and I have discussed the plan of care with the house staff. Please see their note for complete details. I concur with their findings with the following additions/corrections:   Not doing as well today.  Looks more fatigued and having difficulty swallowing.  Was hypothermic again overnight and this morning, although minimal vital signs have been recorded throughout the day today.  Last BP I can find is from 1600 yesterday.  After initially improving, renal function has plateaued.  Nephrology following, they suspect his actual creatinine clearance is quite low given his low muscle mass.  BUN remains quite high.  No urgent need for dialysis, and would not be a candidate for long-term dialysis.  He is on appropriate antibiotic therapy, but may be having difficulty battling this infection due to his poor overall functional status and severe malnutrition as well as his severely compromised renal function.  We will switch back to hydrocortisone today for stress dose steroids.  We will continue holding mycophenolate for now and only plan to resume around the time of discharge.  Hopefully staff will be able to continue to assist him with all meals, as he is likely to continue to decline without nutrition.  Overall, I fear his prognosis is rather poor.  We will continue aggressive treatment for now, but if he begins to decline further, we may need to readdress with the family and reconsider goals of care.  Lenice Pressman, M.D., Ph.D. 01/21/2019, 3:23 PM

## 2019-01-21 NOTE — Progress Notes (Signed)
[facility]    NEPHROLOGY PROGRESS NOTE  SUBJECTIVE: Patient resting without complaints.  Sleepy this AM but denies chest pain, shortness of breath, nausea, vomiting, diarrhea or dysuria.  Denies any dysgeusia.  Some difficulty tolerating pills.  All other review of systems are negative.   OBJECTIVE:  Vitals:   01/20/19 1600 01/21/19 0100  BP: 106/86   Pulse:    Resp: 14   Temp: (!) 94.8 F (34.9 C) (!) 96.6 F (35.9 C)  SpO2: 100%     Intake/Output Summary (Last 24 hours) at 01/21/2019 0746 Last data filed at 01/20/2019 1546 Gross per 24 hour  Intake 320 ml  Output 750 ml  Net -430 ml      General:  AAOx3 NAD HEENT: MMM [place] AT anicteric sclera Neck:  No JVD CV:  Heart RRR, no rub Lungs:  L/S CTA bilaterally Abd:  abd SNT/ND with normal BS; scaphoid GU:  Bladder non-palpable, condom cath  Extremities:  No LE edema.  Thin.  Skin:  No skin rash Neuro: No asterixis  MEDICATIONS:  . aspirin  81 mg Oral Daily  . ceFEPime (MAXIPIME) IV  500 mg Intravenous Q24H  . cycloSPORINE  125 mg Oral BID  . docusate sodium  200 mg Oral QHS  . enoxaparin (LOVENOX) injection  30 mg Subcutaneous Q24H  . famotidine  20 mg Oral QHS  . feeding supplement (ENSURE ENLIVE)  237 mL Oral TID BM  . feeding supplement (PRO-STAT SUGAR FREE 64)  30 mL Oral TID BM  . memantine  10 mg Oral BID  . multivitamin with minerals  1 tablet Oral Daily  . predniSONE  20 mg Oral Q breakfast  . sevelamer carbonate  0.8 g Oral TID WC  . sodium zirconium cyclosilicate  5 g Oral Once  . tamsulosin  0.4 mg Oral QPC supper       LABS:   CBC Latest Ref Rng & Units 01/21/2019 01/20/2019 01/19/2019  WBC 4.0 - 10.5 K/uL 8.3 5.6 5.4  Hemoglobin 13.0 - 17.0 g/dL 8.0(L) 8.0(L) 7.1(L)  Hematocrit 39.0 - 52.0 % 25.7(L) 26.1(L) 22.3(L)  Platelets 150 - 400 K/uL 126(L) 161 162    CMP Latest Ref Rng & Units 01/21/2019 01/20/2019 01/20/2019  Glucose 70 - 99 mg/dL 102(H) 105(H) 132(H)  BUN 8 - 23 mg/dL  168(H) 165(H) 170(H)  Creatinine 0.61 - 1.24 mg/dL 4.06(H) 4.05(H) 4.03(H)  Sodium 135 - 145 mmol/L 142 141 140  Potassium 3.5 - 5.1 mmol/L 4.6 4.6 4.7  Chloride 98 - 111 mmol/L 107 104 105  CO2 22 - 32 mmol/L 22 21(L) 23  Calcium 8.9 - 10.3 mg/dL 9.8 9.7 9.6  Total Protein 6.5 - 8.1 g/dL - - -  Total Bilirubin 0.3 - 1.2 mg/dL - - -  Alkaline Phos 38 - 126 U/L - - -  AST 15 - 41 U/L - - -  ALT 0 - 44 U/L - - -    Lab Results  Component Value Date   PTH 29 01/18/2019   CALCIUM 9.8 01/21/2019   PHOS 5.0 (H) 01/21/2019       Component Value Date/Time   COLORURINE YELLOW 02/07/2019 0541   APPEARANCEUR CLOUDY (A) 02/02/2019 0541   LABSPEC 1.010 01/17/2019 0541   PHURINE 6.0 01/29/2019 0541   GLUCOSEU NEGATIVE 01/28/2019 0541   HGBUR LARGE (A) 01/12/2019 0541   BILIRUBINUR NEGATIVE 01/13/2019 0541   KETONESUR NEGATIVE 01/11/2019 0541   PROTEINUR 30 (A) 02/02/2019 0541   NITRITE NEGATIVE  01/20/2019 0541   LEUKOCYTESUR LARGE (A) 02/03/2019 0541   No results found for: PHART, PCO2ART, PO2ART, HCO3, TCO2, ACIDBASEDEF, O2SAT     Component Value Date/Time   IRON 42 (L) 01/20/2019 0314   TIBC 162 (L) 01/20/2019 0314   FERRITIN 516 (H) 01/20/2019 0314   IRONPCTSAT 26 01/20/2019 0314       ASSESSMENT/PLAN:    85 year old male patient with a past medical history significant for hypertension, coronary artery disease, atrial fibrillation, BPH status post prostate surgery, 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 Jimmy [facility] at [facility], CKD stage III-IV with a baseline serum creatinine around 1.8-2, renal cell carcinoma status post bilateral nephrectomies of his native kidneys, dementia and severe malnutrition who presents to the emergency department with abdominal pain, weakness, chest pain, and shortness of breath.  He was noted to have acute kidney injury likely secondary to ATN in the setting of septic shock from a urinary tract  infection.  1.  Chronic kidney disease stage III-IV with a baseline serum creatinine around 1.8-2.  He has underlying FSGS and is status post renal transplantation x2.  2.  Acute kidney injury.  Likely on the basis of ATN in the setting of septic shock.  Renal function initially improved but has now stagnated with Cr ~4.  He has very little muscle mass and I think his CrCl is likely very low.    Monitor closely for signs of uremia.  Currently no urgent indication for dialysis.  He appears to have a limited functional status at baseline and would not be a good candidate for long term dialysis.    3.  Kidney transplant immunosuppression.  Received stress dose steroids with hydrocortisone due to septic shock and is now on oral prednisone.  Continue cyclosporine.  Myfortic was on hold due to sepsis - will restart at or prior to discharge.  Cyclosporine level reordered 01/20/19 - may need dose adjustment with cardizem discontinuation.  Cyclosporine has been changed to a liquid due to difficulty with pills.  4.  Septic shock due to UTI and pneumonia.  Improved.  S/p azithromycin and now on cefepime.  Would recommend void trial today.  5.  Hyperkalemia.  Improved.  6.  Anemia due to chronic disease.  Hb 8 this AM.  Consider starting ESA if down trending.   7.  Severe malnutrition and protein calorie malnutrition.  Continue protein supplements but would not be too aggressive in the setting of a markedly elevated BUN.  8.  Hyperphosphatemia.  Sevelamer added.  Changed to powder.  Jannifer Hick MD [facility] Pager [phone]

## 2019-01-21 NOTE — Progress Notes (Signed)
Patient bladder scan show 380 ml, intermittent cath completed 250 ml of urine drain. Pt has no complaints of bladder pain or discomfort. Will continue to bladder scan and monitor intake and output.

## 2019-01-21 NOTE — Progress Notes (Signed)
Pharmacy Antibiotic Note  [name] [name] is a 85 y.o. male admitted on 01/24/2019 with sepsis.  Pharmacy has been consulted for cefepime dosing.  Currently treating for graft pyelo. Urine cultures positive for PsA and e.coli (S-cefepime). Scr stable at 4.06 (CrCl<10). Anticipate end date for abx at 5/17 per attending note     Plan: Continue cefepime 500mg  IV q24h Monitor renal fx, clinical pic,   Height: 5\' 9"  (175.3 cm) Weight: 116 lb 6.5 oz (52.8 kg) IBW/kg (Calculated) : 70.7  Temp (24hrs), Avg:95.5 F (35.3 C), Min:94.8 F (34.9 C), Max:96.6 F (35.9 C)  Recent Labs  Lab 01/20/2019 0541 01/19/2019 0726  01/16/19 0438 01/17/19 0319 01/18/19 0321 01/18/19 1324 01/19/19 0428 01/20/19 0314 01/20/19 1059 01/21/19 0331  WBC 13.0*  --   --  12.5* 10.6*  --  5.8 5.4 5.6  --  8.3  CREATININE 5.29*  --    < > 4.70* 4.78* 4.52*  --  4.41* 4.03* 4.05* 4.06*  LATICACIDVEN 2.0* 1.9  --   --   --   --   --   --   --   --   --   VANCORANDOM  --   --   --  11  --   --   --   --   --   --   --    < > = values in this interval not displayed.    Estimated Creatinine Clearance: 9.4 mL/min (A) (by C-G formula based on SCr of 4.06 mg/dL (H)).    Allergies  Allergen Reactions  . Ciprofloxacin Rash  . Clindamycin/Lincomycin Rash  . Levofloxacin Rash  . Septra [Sulfamethoxazole-Trimethoprim] Rash    Antimicrobials this admission: Vanc 5/6 x1 Metronidazole 5/6 x1  Cefepime 5/6>>(5/17) Azith 5/6 >>5/10  Dose adjustments this admission: N/A  Microbiology results: Ucx 5/6: Pseudomonas + Ecoli (both pan sensitive) COVID 5/6: negative Bcx 5/6 x2: ngtd   Ayinde Swim A. Levada Dy, PharmD, [place] Please utilize Amion for appropriate phone number to reach the unit pharmacist ([place])   01/21/2019 10:54 AM

## 2019-01-22 LAB — CBC
HCT: 25.7 % — ABNORMAL LOW (ref 39.0–52.0)
Hemoglobin: 8.2 g/dL — ABNORMAL LOW (ref 13.0–17.0)
MCH: 28.5 pg (ref 26.0–34.0)
MCHC: 31.9 g/dL (ref 30.0–36.0)
MCV: 89.2 fL (ref 80.0–100.0)
Platelets: 136 10*3/uL — ABNORMAL LOW (ref 150–400)
RBC: 2.88 MIL/uL — ABNORMAL LOW (ref 4.22–5.81)
RDW: 19.3 % — ABNORMAL HIGH (ref 11.5–15.5)
WBC: 8.5 10*3/uL (ref 4.0–10.5)
nRBC: 0 % (ref 0.0–0.2)

## 2019-01-22 LAB — RENAL FUNCTION PANEL
Albumin: 2 g/dL — ABNORMAL LOW (ref 3.5–5.0)
Anion gap: 13 (ref 5–15)
BUN: 161 mg/dL — ABNORMAL HIGH (ref 8–23)
CO2: 23 mmol/L (ref 22–32)
Calcium: 10 mg/dL (ref 8.9–10.3)
Chloride: 109 mmol/L (ref 98–111)
Creatinine, Ser: 3.97 mg/dL — ABNORMAL HIGH (ref 0.61–1.24)
GFR calc Af Amer: 15 mL/min — ABNORMAL LOW (ref >60)
GFR calc non Af Amer: 13 mL/min — ABNORMAL LOW (ref >60)
Glucose, Bld: 91 mg/dL (ref 70–99)
Phosphorus: 4.7 mg/dL — ABNORMAL HIGH (ref 2.5–4.6)
Potassium: 5.1 mmol/L (ref 3.5–5.1)
Sodium: 145 mmol/L (ref 135–145)

## 2019-01-22 MED ORDER — SODIUM CHLORIDE 0.9 % IV BOLUS
500.0000 mL | Freq: Once | INTRAVENOUS | Status: AC
  Administered 2019-01-22: 500 mL via INTRAVENOUS

## 2019-01-22 NOTE — Progress Notes (Signed)
Physical Therapy Treatment Patient Details Name: [name] [name] MRN: [id number] DOB: 1927/09/16 Today's Date: 01/22/2019    History of Present Illness Pt is a 85 y.o. M with significant PMH of alzheimer's dementia, CKD IV s/p renal transplant, CAD, atrial fibrillation, who presents with sepsis in setting of UTI and RUL pneumonia.     PT Comments    Pt making fair progress with mobility. Pt would continue to benefit from skilled physical therapy services at this time while admitted and after d/c to address the below listed limitations in order to improve overall safety and independence with functional mobility.    Follow Up Recommendations  SNF;Supervision/Assistance - 24 hour     Equipment Recommendations  None recommended by PT    Recommendations for Other Services       Precautions / Restrictions Precautions Precautions: Fall Restrictions Weight Bearing Restrictions: No    Mobility  Bed Mobility Overal bed mobility: Needs Assistance Bed Mobility: Supine to Sit     Supine to sit: Mod assist;HOB elevated     General bed mobility comments: increased time and effort, multimodal cueing to complete task, use of bed pads to position pt's hips at EOB  Transfers Overall transfer level: Needs assistance Equipment used: 2 person hand held assist Transfers: Sit to/from [facility] Sit to Stand: Mod assist;+2 safety/equipment Stand pivot transfers: Mod assist;+2 physical assistance       General transfer comment: use of bed pads with face-to-face method and gait belt  Ambulation/Gait             General Gait Details: pt adamantly declining and stating that he was hungry with meal present - only agreeable to pivot to chair   Stairs             Wheelchair Mobility    Modified Rankin (Stroke Patients Only)       Balance Overall balance assessment: Needs assistance Sitting-balance support: Feet supported Sitting balance-Leahy Scale:  Fair     Standing balance support: Bilateral upper extremity supported Standing balance-Leahy Scale: Poor                              Cognition Arousal/Alertness: Awake/alert Behavior During Therapy: Flat affect Overall Cognitive Status: History of cognitive impairments - at baseline                                 General Comments: history of dementia      Exercises      General Comments        Pertinent Vitals/Pain Pain Assessment: No/denies pain    Home Living                      Prior Function            PT Goals (current goals can now be found in the care plan section) Acute Rehab PT Goals PT Goal Formulation: With patient/family Time For Goal Achievement: 02/01/19 Potential to Achieve Goals: Fair Progress towards PT goals: Progressing toward goals    Frequency    Min 3X/week      PT Plan Current plan remains appropriate    Co-evaluation              AM-PAC PT "6 Clicks" Mobility   Outcome Measure  Help needed turning from your back to your side while in a flat  bed without using bedrails?: A Lot Help needed moving from lying on your back to sitting on the side of a flat bed without using bedrails?: A Lot Help needed moving to and from a bed to a chair (including a wheelchair)?: A Lot Help needed standing up from a chair using your arms (e.g., wheelchair or bedside chair)?: A Lot Help needed to walk in hospital room?: A Lot Help needed climbing 3-5 steps with a railing? : Total 6 Click Score: 11    End of Session Equipment Utilized During Treatment: Gait belt Activity Tolerance: Patient limited by fatigue Patient left: in chair;with call bell/phone within reach;with chair alarm set Nurse Communication: Mobility status PT Visit Diagnosis: Unsteadiness on feet (R26.81);Other abnormalities of gait and mobility (R26.89);Muscle weakness (generalized) (M62.81)     Time: 1610-9604 PT Time Calculation (min)  (ACUTE ONLY): 16 min  Charges:  $Therapeutic Activity: 8-22 mins                     Sherie Don, PT, DPT  Acute Rehabilitation Services Pager [phone] Office [place] 01/22/2019, 4:21 PM

## 2019-01-22 NOTE — Progress Notes (Signed)
[facility]    NEPHROLOGY PROGRESS NOTE  SUBJECTIVE: This morning patient more alert and talking.  Denies complaints.  Breakfast tray showed a few bites eaten, really not much.  Team trying to involve staff on floor to assist him eating.  He denies dysgeusia.  Given hypothermia stress dose steroids being given again.  Palliative care involved.  PVR > 200 but less when cathed.    OBJECTIVE:  Vitals:   01/22/19 0300 01/22/19 0533  BP:  (!) 149/125  Pulse:    Resp:  18  Temp: (!) 97.4 F (36.3 C)   SpO2:      Intake/Output Summary (Last 24 hours) at 01/22/2019 1535 Last data filed at 01/22/2019 0847 Gross per 24 hour  Intake 120 ml  Output 512 ml  Net -392 ml      General:  Sitting up in bed, says good morning HEENT: MMM [place] AT anicteric sclera Neck:  No JVD CV:  Heart RRR, no rub Lungs:  L/S CTA bilaterally Abd:  abd SNT/ND with normal BS; scaphoid GU:  Bladder non-palpable, condom cath  Extremities:  No LE edema.  Thin.  Skin:  No skin rash Neuro: No asterixis  MEDICATIONS:  . aspirin  81 mg Oral Daily  . ceFEPime (MAXIPIME) IV  500 mg Intravenous Q24H  . cycloSPORINE  125 mg Oral BID  . docusate sodium  200 mg Oral QHS  . enoxaparin (LOVENOX) injection  30 mg Subcutaneous Q24H  . famotidine  20 mg Oral QHS  . feeding supplement (ENSURE ENLIVE)  237 mL Oral TID BM  . feeding supplement (PRO-STAT SUGAR FREE 64)  30 mL Oral TID BM  . hydrocortisone sod succinate (SOLU-CORTEF) inj  50 mg Intravenous Q12H  . memantine  10 mg Oral BID  . multivitamin with minerals  1 tablet Oral Daily  . sevelamer carbonate  0.8 g Oral TID WC  . sodium zirconium cyclosilicate  5 g Oral Once  . tamsulosin  0.4 mg Oral QPC supper       LABS:   CBC Latest Ref Rng & Units 01/22/2019 01/21/2019 01/20/2019  WBC 4.0 - 10.5 K/uL 8.5 8.3 5.6  Hemoglobin 13.0 - 17.0 g/dL 8.2(L) 8.0(L) 8.0(L)  Hematocrit 39.0 - 52.0 % 25.7(L) 25.7(L) 26.1(L)  Platelets 150 - 400 K/uL  136(L) 126(L) 161    CMP Latest Ref Rng & Units 01/22/2019 01/21/2019 01/20/2019  Glucose 70 - 99 mg/dL 91 102(H) 105(H)  BUN 8 - 23 mg/dL 161(H) 168(H) 165(H)  Creatinine 0.61 - 1.24 mg/dL 3.97(H) 4.06(H) 4.05(H)  Sodium 135 - 145 mmol/L 145 142 141  Potassium 3.5 - 5.1 mmol/L 5.1 4.6 4.6  Chloride 98 - 111 mmol/L 109 107 104  CO2 22 - 32 mmol/L 23 22 21(L)  Calcium 8.9 - 10.3 mg/dL 10.0 9.8 9.7  Total Protein 6.5 - 8.1 g/dL - - -  Total Bilirubin 0.3 - 1.2 mg/dL - - -  Alkaline Phos 38 - 126 U/L - - -  AST 15 - 41 U/L - - -  ALT 0 - 44 U/L - - -    Lab Results  Component Value Date   PTH 29 01/18/2019   CALCIUM 10.0 01/22/2019   PHOS 4.7 (H) 01/22/2019       Component Value Date/Time   COLORURINE YELLOW 01/14/2019 0541   APPEARANCEUR CLOUDY (A) 02/03/2019 0541   LABSPEC 1.010 01/16/2019 [place] 6.0 01/14/2019 [place] 02/03/2019 0541   HGBUR  LARGE (A) 01/26/2019 0541   [place] NEGATIVE 01/27/2019 0541   [place] 01/29/2019 0541   PROTEINUR 30 (A) 01/26/2019 0541   NITRITE NEGATIVE 01/16/2019 0541   LEUKOCYTESUR LARGE (A) 02/08/2019 0541   No results found for: PHART, PCO2ART, PO2ART, HCO3, TCO2, ACIDBASEDEF, O2SAT     Component Value Date/Time   IRON 42 (L) 01/20/2019 0314   TIBC 162 (L) 01/20/2019 0314   FERRITIN 516 (H) 01/20/2019 0314   IRONPCTSAT 26 01/20/2019 0314       ASSESSMENT/PLAN:    85 year old male patient with a past medical history significant for hypertension, coronary artery disease, atrial fibrillation, BPH status post prostate surgery, 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 Jimmy Footman at [facility], CKD stage III-IV with a baseline serum creatinine around 1.8-2, renal cell carcinoma status post bilateral nephrectomies of his native kidneys, dementia and severe malnutrition who presents to the emergency department with abdominal pain, weakness, chest pain, and shortness of  breath.  He was noted to have acute kidney injury likely secondary to ATN in the setting of septic shock from a urinary tract infection.  1.  Chronic kidney disease stage III-IV with a baseline serum creatinine around 1.8-2.  He has underlying FSGS and is status post renal transplantation x2.  2.  Acute kidney injury.  Likely on the basis of ATN in the setting of septic shock.  Renal function initially improved but has now stagnated with Cr ~4.  Question if maintaining adequate oral hydration. NS 545mL volume challenge today though certainly this is not a long term solution and he may third space a good bit of the fluids.   He has very little muscle mass and I think his CrCl is likely very low.    Monitor closely for signs of uremia - with dementia and frailty it's a bit challenging to discern.  Currently no urgent indication for dialysis.  He appears to have a limited functional status at baseline and would not be a good candidate for long term dialysis.  I think it's a good idea for palliative care to be involved.   3.  Kidney transplant immunosuppression.  Received stress dose steroids with hydrocortisone due to septic shock and is now back on them due to hypothermia.  Continue cyclosporine.  Myfortic was on hold due to sepsis - will restart at or prior to discharge.  Cyclosporine level reordered 01/21/19 - still pending. May need dose adjustment with cardizem discontinuation.  Cyclosporine has been changed to a liquid due to difficulty with pills.  4.  Septic shock due to UTI and pneumonia.  Improved.  S/p azithromycin and now on cefepime.  Foley catheter out, monitoring PVRs.  5.  Hyperkalemia.  Improved.  6.  Anemia due to chronic disease.  Hb 8.2 this AM.  Consider starting ESA if down trending pending GOC.   7.  Severe malnutrition and protein calorie malnutrition.  Continue protein supplements but would not be too aggressive in the setting of a markedly elevated BUN.  8.  Hyperphosphatemia.   Mild, monitor.    Jannifer Hick MD [facility] Pager [phone]

## 2019-01-22 NOTE — Plan of Care (Signed)
Remains oriented to self. Continues to have poor appetite, given miralax for constipation, encouraged drinking fluids.  Reposition frequently to prevent further skin impairment.  Problem: Education: Goal: Knowledge of General Education information will improve Description Including pain rating scale, medication(s)/side effects and non-pharmacologic comfort measures Outcome: Progressing   Problem: Health Behavior/Discharge Planning: Goal: Ability to manage health-related needs will improve Outcome: Progressing   Problem: Clinical Measurements: Goal: Ability to maintain clinical measurements within normal limits will improve Outcome: Progressing Goal: Will remain free from infection Outcome: Progressing Goal: Diagnostic test results will improve Outcome: Progressing Goal: Respiratory complications will improve Outcome: Progressing Goal: Cardiovascular complication will be avoided Outcome: Progressing

## 2019-01-22 NOTE — Progress Notes (Signed)
Palliative Note:  Attempted to contact daughter [name] today. Voicemail left yesterday and today with no return call. Contacted son Orion on listed number. Voicmail left. Will attempt to reach out to family tomorrow with hopes of engaging and having goals of care discussion.   Patient assessed yesterday and today. He is much more alert today and interacting. He was able to verify his living arrangements with his daughter and that his son resides in the [place] area but visits often.   PMT will attempt to contact family again tomorrow.   Alda Lea, AGPCNP-BC Palliative Medicine Team  Phone: [phone] Fax: [phone] Pager: [phone] Amion: N. [place]   NO CHARGE

## 2019-01-22 NOTE — Progress Notes (Signed)
Nutrition Follow-up  RD working remotely.  DOCUMENTATION CODES:   Underweight  INTERVENTION:   -Continue MVI with minerals daily -Continue Ensure Enlive po TID, each supplement provides 350 kcal and 20 grams of protein -Continue 30 ml Prostat to TID, each supplement provides 100 kcals and 15 grams protein -Continue Magic Cup TID with meals, each supplement provides 290 kcals and 9 grams protein -Continue Hormel Shake TID with meals, each supplement provides 520 kcals and 22 grams protein -Continue feeding assistance with meals  NUTRITION DIAGNOSIS:   Predicted suboptimal nutrient intake related to chronic illness(dementia) as evidenced by percent weight loss.  Ongoing  GOAL:   Patient will meet greater than or equal to 90% of their needs  Progressing   MONITOR:   PO intake, Supplement acceptance, Diet advancement, Labs, Weight trends, Skin, I & O's  REASON FOR ASSESSMENT:   Low Braden, Malnutrition Screening Tool    ASSESSMENT:   85 year old man with history of dementia and CKD stage IV of his renal transplant from 1994 on immunosuppression presenting with altered mental status, generalized weakness, dysuria, and shortness of breath.  He has had about 3 days of dysuria and abdominal pain, then became more withdrawn.  Last night he developed chest pain and shortness of breath and she called 911.  5/7- s/p repeat BSE- advanced to dysphagia 1 diet with thin liquids  Reviewed I/O's: -762 ml x 24 hours and -728 ml since admission  Per SLP notes, plan to proceed with diet with known risk in setting of likely chronic dysphagia and for comfort.  UOP: 762 ml x 24 hours  Pt remains with poor oral intake; meal completion 25%. Pt taking in manly bites and sipping on supplements. He is consuming Ensure and Prostat supplements.   Per MD notes, suspect very poor prognosis, especially with limited nutrition. Plan to reach out to family to discuss options (refusing SNF placement,  as pt family would like pt to pass at home). Palliative care consult pending to discuss possibility of hospice.   Noted pt with no documented DM since 01/29/2019 (suspect poor oral intake is contributing).   Labs reviewed: Phos: 4.7.   Diet Order:   Diet Order            DIET - DYS 1 Room service appropriate? No; Fluid consistency: Thin  Diet effective now              EDUCATION NEEDS:   No education needs have been identified at this time  Skin:  Skin Assessment: Reviewed RN Assessment  Last BM:  02/09/2019  Height:   Ht Readings from Last 1 Encounters:  01/19/2019 5\' 9"  (1.753 m)    Weight:   Wt Readings from Last 1 Encounters:  01/22/19 50.4 kg    Ideal Body Weight:  72.7 kg  BMI:  Body mass index is 16.41 kg/m.  Estimated Nutritional Needs:   Kcal:  1600-1800  Protein:  80-95 grams  Fluid:  > 1.6 L    Demont Linford A. Jimmye Norman, RD, LDN, [place] Registered Dietitian II Certified Diabetes Care and Education Specialist Pager: [phone] After hours Pager: [phone]

## 2019-01-22 NOTE — Progress Notes (Signed)
Pt bladder scan per order showed 278 cc. MD paged and made aware. Will continue to monitor and treat per MD orders.

## 2019-01-22 NOTE — Progress Notes (Signed)
Subjective: No overnight events. [name] [name] is awake and lying in bed this morning. The medical team and RN helped position him for breakfast. He states he is ready to eat and has an appetite. No other concerns this morning.   Objective:  Vital signs in last 24 hours: Vitals:   01/21/19 1904 01/21/19 2117 01/22/19 0012 01/22/19 0300  BP:  (!) 140/102    Pulse:  74    Resp:  20    Temp: (!) 97.2 F (36.2 C)  (!) 97.3 F (36.3 C) (!) 97.4 F (36.3 C)  TempSrc: Axillary     SpO2:  95%    Weight:      Height:       Gen: Awake and alert, no distress CV: RRR, no murmurs Pulm: CTAB in the anterior lung fields Abd: bs+, thin, non-tender Ext: no edema  Assessment/Plan:  Principal Problem:   Sepsis ([place]) Active Problems:   BPH (benign prostatic hyperplasia)   Dementia with behavioral disturbance (HCC)   Renal failure   Pyelonephritis of transplanted kidney   CAP (community acquired pneumonia)   Adrenal insufficiency (HCC)   Acute cystitis without hematuria   Weakness  [name] [name] is an 85 yo man with a medical history ofalzheimer's dementia, CKD IV s/p renal transplant in 1994 on chronic immunosuppression, CAD, atrial fibrillation, chronic anemia, BPH, urinary rentention, s/pcystolitholapaxy, rightorchiectomy, prostatectomy,and chronic hematuriawho presented with sepsis in the setting of UTI and RUL pneumonia.He was hypothermic, hypotensive, and bradycardic on admission, but he is now hemodynamically stable.  Sepsis RULpneumonia UTI Hypothermia Continued to be hypothermic over the past 24 hours with temps ranging 95-97.4 despite stress dose solucortef. Unclear whether this represents ongoing infection, new aspiration event, or is not infectious in nature. Patient has very low body mass, which may contribute to low grade hypothermia. Urinecx grewpseudomonas (sensitive to cefepime) and e coli.Negative blood cx. Completed 5 days ofazithromycin.Will continue cefepime for  PNA and pseudomonas UTI for 5-7 days after clinical improvement. - Continue cefepime(day 8) -Continue IV solucortef until his temperature improves  Acute on chronic renal failure Hx renal transplant 1994 Hx BPH and urinary retention  Baseline Cr 1.5-2.0. Cr 4.88 on admission.Has gradually improved, is 3.9 today (stable from yesterday).Likely ATN in the setting of sepsis and hypotension. Removed foley cath for voiding trial, but he has required in and out catheterization x2 for poor urine output and retention on bladder scan. He is not a candidate for HD per nephro. - Nephrology following, appreciate recs - Continue home cyclosporine -Holding home prednisone while on stress dose IV steroids - Hold home mycophenolate (resume at discharge) - Continue home flomax - Hold home lasix - Bladder scan each shift with prn in and out caths - Trend renal function - Holding IVF per nephro  GOC: Patient is DNR. PT recommending SNF. Patient's son and daughter prefer that he pass at home when his time comes. They are working to increase their home health support. He may benefit from home hospice.  - Palliative care consulted, appreciate recs  Pharyngeal and esophageal dysphagia: SLP eval showed dysphagia with probable aspiration events.Patient has fluctuating mental status. Sometimes he can swallow pills without issue, other times he cannot. - Dysphagia 1 diet - Assistance with meals - Liquid cyclosporine. Make other meds solutions prn.  Afib: Rate-controlled with diltiazem. Not on anticoagulation 2/2 prior GI bleed. HRnormal. - Holding diltiazem   Acute on chronic normocytic anemia: Likely secondary to CKD,chronic hematuria without definite etiology despite cystoscopy and  urological evaluation, and mild iron deficiency.Hb 8.3 on admission,now stable. Ironmildly low, does not need iron supplementation.  CAD: Continue home aspirin Alzheimer's dementia:continue home memantine   Dispo: Anticipated discharge pending clinical improvement.  Dorrell, Andree Elk, MD 01/22/2019, 6:51 AM Pager: [phone]

## 2019-01-22 NOTE — Progress Notes (Signed)
  Date: 01/22/2019  Patient name: [name] [name]  Medical record number: [id number]  Date of birth: [birth year]   I have seen and evaluated this patient and I have discussed the plan of care with the house staff. Please see their note for complete details. I concur with their findings with the following additions/corrections:   Stable since yesterday, T 95.5 yesterday afternoon, >97 overnight. Seems about the same, still quite frail. Needed I/O cath for retention, although bladder scan read 162 cc post-cath, suggesting possibly inaccurate.   Will continue IV cefepime and stress-dose hydrocortisone given hypothermia yesterday. Hopefully he will show more definitive response soon.   Lenice Pressman, M.D., Ph.D. 01/22/2019, 10:37 AM

## 2019-01-22 NOTE — Progress Notes (Signed)
Pt output 50 cc of urine. Bladder scan recheck showed 233. MD paged and made aware. Will continue to monitor and treat per MD orders.

## 2019-01-22 NOTE — Progress Notes (Signed)
Pt bladder scan showed 319 cc of urine. MD, Alfonse Spruce, paged and made aware. Intermittent cath performed per order. 150 cc of urine retrieved during intermittent cath. Post void bladder scan showed 162. Pt in no apparent distress. Will notify day shift RN in handoff.

## 2019-01-23 DIAGNOSIS — F0391 Unspecified dementia with behavioral disturbance: Secondary | ICD-10-CM

## 2019-01-23 DIAGNOSIS — R6520 Severe sepsis without septic shock: Secondary | ICD-10-CM

## 2019-01-23 DIAGNOSIS — R531 Weakness: Secondary | ICD-10-CM

## 2019-01-23 DIAGNOSIS — N401 Enlarged prostate with lower urinary tract symptoms: Secondary | ICD-10-CM

## 2019-01-23 DIAGNOSIS — E871 Hypo-osmolality and hyponatremia: Secondary | ICD-10-CM

## 2019-01-23 DIAGNOSIS — Z66 Do not resuscitate: Secondary | ICD-10-CM

## 2019-01-23 DIAGNOSIS — A4152 Sepsis due to Pseudomonas: Secondary | ICD-10-CM

## 2019-01-23 DIAGNOSIS — N170 Acute kidney failure with tubular necrosis: Secondary | ICD-10-CM

## 2019-01-23 DIAGNOSIS — N3000 Acute cystitis without hematuria: Secondary | ICD-10-CM

## 2019-01-23 DIAGNOSIS — J181 Lobar pneumonia, unspecified organism: Secondary | ICD-10-CM

## 2019-01-23 DIAGNOSIS — Z515 Encounter for palliative care: Secondary | ICD-10-CM

## 2019-01-23 DIAGNOSIS — R350 Frequency of micturition: Secondary | ICD-10-CM

## 2019-01-23 LAB — RENAL FUNCTION PANEL
Albumin: 2 g/dL — ABNORMAL LOW (ref 3.5–5.0)
Anion gap: 13 (ref 5–15)
BUN: 166 mg/dL — ABNORMAL HIGH (ref 8–23)
CO2: 23 mmol/L (ref 22–32)
Calcium: 10 mg/dL (ref 8.9–10.3)
Chloride: 111 mmol/L (ref 98–111)
Creatinine, Ser: 3.67 mg/dL — ABNORMAL HIGH (ref 0.61–1.24)
GFR calc Af Amer: 16 mL/min — ABNORMAL LOW (ref >60)
GFR calc non Af Amer: 14 mL/min — ABNORMAL LOW (ref >60)
Glucose, Bld: 109 mg/dL — ABNORMAL HIGH (ref 70–99)
Phosphorus: 4.7 mg/dL — ABNORMAL HIGH (ref 2.5–4.6)
Potassium: 4.7 mmol/L (ref 3.5–5.1)
Sodium: 147 mmol/L — ABNORMAL HIGH (ref 135–145)

## 2019-01-23 LAB — CYCLOSPORINE: Cyclosporine, LabCorp: 234 ng/mL (ref 100–400)

## 2019-01-23 MED ORDER — DEXTROSE 5 % IV SOLN
INTRAVENOUS | Status: DC
  Administered 2019-01-23: 09:00:00 via INTRAVENOUS

## 2019-01-23 MED ORDER — DEXTROSE-NACL 5-0.45 % IV SOLN
INTRAVENOUS | Status: AC
  Administered 2019-01-23: 10:00:00 via INTRAVENOUS

## 2019-01-23 MED ORDER — DEXTROSE-NACL 5-0.9 % IV SOLN: INTRAVENOUS | Status: DC

## 2019-01-23 NOTE — Progress Notes (Signed)
Nutrition Follow-up  DOCUMENTATION CODES:   Underweight, Severe malnutrition in context of chronic illness  INTERVENTION:   -Continue MVI with minerals daily -ContinueEnsure Enlive poTID, each supplement provides 350 kcal and 20 grams of protein -Continue 30 ml Prostat to TID, each supplement provides 100 kcals and 15 grams protein -Continue Magic Cup TID with meals, each supplement provides 290 kcals and 9 grams protein -Continue Hormel Shake TID with meals, each supplement provides 520 kcals and 22 grams protein -Continue feeding assistance with meals  NUTRITION DIAGNOSIS:   Severe Malnutrition related to chronic illness(dementia) as evidenced by percent weight loss, severe fat depletion, severe muscle depletion.  Ongoing  GOAL:   Patient will meet greater than or equal to 90% of their needs  Progressing  MONITOR:   PO intake, Supplement acceptance, Diet advancement, Labs, Weight trends, Skin, I & O's  REASON FOR ASSESSMENT:   Low Braden, Malnutrition Screening Tool    ASSESSMENT:   85 year old man with history of dementia and CKD stage IV of his renal transplant from 1994 on immunosuppression presenting with altered mental status, generalized weakness, dysuria, and shortness of breath.  He has had about 3 days of dysuria and abdominal pain, then became more withdrawn.  Last night he developed chest pain and shortness of breath and she called 911.  5/7- s/p repeat BSE- advanced to dysphagia 1 diet with thin liquids  Reviewed I/O's: +126 ml x 24 hours and -602 ml since admission  UOP: 450 ml x 24 hours  Per SLP notes, plan to proceed with diet with known risk in setting of likely chronic dysphagia and for comfort.  Reviewed wt hx; pt has experienced a 19.1% wt loss over the past 3 months, which is significant for time frame.  Spoke with pt, who responded "yeah" to all of RD questions. Observed meal tray- pt consumed only 1/3 of applesauce for breakfast. Meal  completion 0-25%. Per RN notes, pt has been too lethargic to eat and take medications today.   Noted pt with no documented DM since 01/13/2019 (suspect poor oral intake is contributing).   Per MD notes, pt weaker today and suspect pt may not survive this current illness. Palliative care team reaching out to family to re-address goals of care.  Labs reviewed: Na: 147, Phos: 4.7.   NUTRITION - FOCUSED PHYSICAL EXAM:    Most Recent Value  Orbital Region  Severe depletion  Upper Arm Region  Severe depletion  Thoracic and Lumbar Region  Severe depletion  Buccal Region  Severe depletion  Temple Region  Severe depletion  Clavicle Bone Region  Severe depletion  Clavicle and Acromion Bone Region  Severe depletion  Scapular Bone Region  Severe depletion  Dorsal Hand  Severe depletion  Patellar Region  Severe depletion  Anterior Thigh Region  Severe depletion  Posterior Calf Region  Severe depletion  Edema (RD Assessment)  None  Hair  Reviewed  Eyes  Reviewed  Mouth  Reviewed  Skin  Reviewed  Nails  Reviewed       Diet Order:   Diet Order            DIET - DYS 1 Room service appropriate? No; Fluid consistency: Thin  Diet effective now              EDUCATION NEEDS:   Not appropriate for education at this time  Skin:  Skin Assessment: Skin Integrity Issues: Skin Integrity Issues:: Stage II Stage II: coccyx  Last BM:  01/31/2019  Height:  Ht Readings from Last 1 Encounters:  01/24/2019 5\' 9"  (1.753 m)    Weight:   Wt Readings from Last 1 Encounters:  01/23/19 49.9 kg    Ideal Body Weight:  72.7 kg  BMI:  Body mass index is 16.24 kg/m.  Estimated Nutritional Needs:   Kcal:  1600-1800  Protein:  80-95 grams  Fluid:  > 1.6 L    Jeramia Saleeby A. Jimmye Norman, RD, LDN, [place] Registered Dietitian II Certified Diabetes Care and Education Specialist Pager: [phone] After hours Pager: [phone]

## 2019-01-23 NOTE — Progress Notes (Addendum)
Subjective: [name] [name] was placed on the bair hugger last night for ongoing hypothermia. He also required a third I/O cath. Bladder scan noted 371cc and foley relieved 300cc urine with blood clots. Pt has history of chronic hematuria. This morning, [name] [name] is alert, but less interactive than days prior. His mucous membranes are very dry and he has difficulty closing his mouth to sip water from a straw. He answers yes when asked if he's thirsty and hungry. He has food and liquids at bedside, but he requires assistance with all feeds.   Spoke with his daughter via telephone to update her on patient's status. She is concerned about next steps and plans to speak with palliative care today.  Objective:  Vital signs in last 24 hours: Vitals:   01/22/19 2133 01/22/19 2300 01/23/19 0414 01/23/19 0423  BP: (!) 144/97  (!) 128/99   Pulse: 63     Resp: 13  17   Temp:  (!) 97.3 F (36.3 C) (!) 95.5 F (35.3 C)   TempSrc:  Oral Rectal   SpO2: 100%  100%   Weight:    49.9 kg  Height:       Gen: frail elderly man, no distress HENT: dry mucous membranes CV: RRR, no murmurs Abd: thin, non-tender Ext: no edema   Assessment/Plan:  Principal Problem:   Sepsis ([place]) Active Problems:   BPH (benign prostatic hyperplasia)   Dementia with behavioral disturbance (HCC)   Renal failure   Pyelonephritis of transplanted kidney   CAP (community acquired pneumonia)   Adrenal insufficiency (HCC)   Acute cystitis without hematuria   Weakness  [name] [name] is an 85 yo man with a medical history ofalzheimer's dementia, CKD IV s/p renal transplant in 1994 on chronic immunosuppression, CAD, atrial fibrillation, chronic anemia, BPH, urinary rentention, s/pcystolitholapaxy, rightorchiectomy, prostatectomy,and chronic hematuriawho presented with sepsis in the setting of UTI and RUL pneumonia.He was hypothermic, hypotensive, and bradycardic on admission. He has continued to have hypothermia despite stress dose  steroids and broad spectrum IV abx, but is otherwise hemodynamically stable and at baseline mentation. Overall, his prognosis appears poor as his renal dysfunction has persisted and he is taking in little nutrition.   Sepsis RULpneumonia E coli and pseudomonas UTI Hypothermia Continued to be hypothermic over the past 24 hours with temps ranging 95-97. Unclear whether this represents ongoing infection, new aspiration event, or is not infectious in nature. Patient has very low body mass, which may contribute to low grade hypothermia. Completed 5 days ofazithromycin.Continuing cefepime for PNA and pseudomonas UTIfor 5-7 days after clinical improvement. - Continue cefepime(day9) -Continue IV solucortef until his temperature improves  Acute on chronic renal failure Hx renal transplant 1994 Hx BPH and urinary retention  Baseline Cr 1.5-2.0. Cr 4.88 on admission.Has gradually improved, is 3.6 today.Likely ATN in the setting of sepsis and hypotension.Removed foley cath for voiding trial, but he has required in and out catheterization x3 for poor urine output and retention on bladder scan. He is not a candidate for HD per nephro. - Nephrology following, appreciate recs - Continue home cyclosporine -Holding home prednisone while on stress dose IV steroids - Hold home mycophenolate(resume at discharge) - Continue home flomax - Hold home lasix - Bladder scan each shift with prn in and out caths - Trend renal function  Hyponatremia: Na has slowly increased since admission. 147 today. Likely 2/2 poor PO intake.  - D5W at slow rate  GOC: Patient is DNR. PT recommending SNF. Patient's son and  daughter prefer that he pass at home when his time comes. - Palliative care consulted, appreciate recs  Pharyngeal and esophageal dysphagia: SLP eval showed dysphagia with probable aspiration events.Patient has fluctuating mental status. Sometimes he can swallow pills without issue, other times he  cannot. - Dysphagia 1 diet - Assistance with meals - Liquid cyclosporine. Make other meds solutions prn.  Afib: Rate-controlled with diltiazem. Not on anticoagulation 2/2 prior GI bleed. HRnormal. - Holding diltiazem   Acute on chronic normocytic anemia: Likely secondary to CKD,chronic hematuria without definite etiology despite cystoscopy and urological evaluation, and mild iron deficiency.Hb 8.3 on admission,now stable. Ironmildly low,does not need iron supplementation.  CAD: Continue home aspirin Alzheimer's dementia:continue home memantine  Dispo: Anticipated dischargepending clinical improvement.  Yamila Cragin, Andree Elk, MD 01/23/2019, 6:53 AM Pager: [phone]

## 2019-01-23 NOTE — Progress Notes (Signed)
  Date: 01/23/2019  Patient name: [name] [name]  Medical record number: [id number]  Date of birth: 01/02/1928   I have seen and evaluated this patient and I have discussed the plan of care with the house staff. Please see their note for complete details. I concur with their findings with the following additions/corrections:   Weaker today, unable to eat or drink when I tried to assist him. Still getting intermittently hypothermic, despite appropriate antibiotics for 9 days and resumption of stress dose hydrocortisone. Unfortunately, he may not survive this illness. We will continue to readdress goals of care today with family and palliative care team. May consider transition to comfort care and hospice soon.  Lenice Pressman, M.D., Ph.D. 01/23/2019, 11:11 AM

## 2019-01-23 NOTE — Plan of Care (Signed)
Patient is less alert/oriented than yesterday. No attempt to eat/drink this morning held am meds. Unable to drink water/close mouth began coughing with small amount of water. Suctioned orally. Oral moisturizer applied. Given pericare and repositioned. Team updated. Problem: Education: Goal: Knowledge of General Education information will improve Description Including pain rating scale, medication(s)/side effects and non-pharmacologic comfort measures Outcome: Progressing   Problem: Health Behavior/Discharge Planning: Goal: Ability to manage health-related needs will improve Outcome: Progressing   Problem: Clinical Measurements: Goal: Ability to maintain clinical measurements within normal limits will improve Outcome: Progressing Goal: Will remain free from infection Outcome: Progressing Goal: Diagnostic test results will improve Outcome: Progressing Goal: Respiratory complications will improve Outcome: Progressing Goal: Cardiovascular complication will be avoided Outcome: Progressing

## 2019-01-23 NOTE — Progress Notes (Signed)
Peri care performed and bed pad changed. Gown donned. In and out cath performed on pt with assistance by Leonides Sake, RN. 300 cc of urine retrieved. Several clots were noticed in urine. On call MD, Alfonse Spruce, paged and made aware. Will continue to monitor and treat per MD orders.

## 2019-01-23 NOTE — Progress Notes (Addendum)
[facility]    NEPHROLOGY PROGRESS NOTE  SUBJECTIVE: This morning more lethargic and not able to attempt to eat/ swallow even.  Not talking to me.  D5W added due to poor po intake.  Palliative care hasn't been able to connect with family.    OBJECTIVE:  Vitals:   01/23/19 0414 01/23/19 0814  BP: (!) 128/99 91/73  Pulse:  63  Resp: 17 19  Temp: (!) 95.5 F (35.3 C)   SpO2: 100% 97%    Intake/Output Summary (Last 24 hours) at 01/23/2019 0935 Last data filed at 01/23/2019 2993 Gross per 24 hour  Intake 455.75 ml  Output 450 ml  Net 5.75 ml      General:  Lying 45 degrees, trying to cough without success, does not answer questions HEENT: MMM [place] AT anicteric sclera Neck:  No JVD CV:  Heart RRR, no rub Lungs:  L/S CTA bilaterally Abd:  abd SNT/ND with normal BS; scaphoid GU:  Bladder non-palpable, condom cath  Extremities:  No LE edema.  Thin.  Skin:  No skin rash Neuro: No asterixis  MEDICATIONS:  . aspirin  81 mg Oral Daily  . ceFEPime (MAXIPIME) IV  500 mg Intravenous Q24H  . cycloSPORINE  125 mg Oral BID  . docusate sodium  200 mg Oral QHS  . enoxaparin (LOVENOX) injection  30 mg Subcutaneous Q24H  . famotidine  20 mg Oral QHS  . feeding supplement (ENSURE ENLIVE)  237 mL Oral TID BM  . feeding supplement (PRO-STAT SUGAR FREE 64)  30 mL Oral TID BM  . hydrocortisone sod succinate (SOLU-CORTEF) inj  50 mg Intravenous Q12H  . memantine  10 mg Oral BID  . multivitamin with minerals  1 tablet Oral Daily  . sodium zirconium cyclosilicate  5 g Oral Once  . tamsulosin  0.4 mg Oral QPC supper       LABS:   CBC Latest Ref Rng & Units 01/22/2019 01/21/2019 01/20/2019  WBC 4.0 - 10.5 K/uL 8.5 8.3 5.6  Hemoglobin 13.0 - 17.0 g/dL 8.2(L) 8.0(L) 8.0(L)  Hematocrit 39.0 - 52.0 % 25.7(L) 25.7(L) 26.1(L)  Platelets 150 - 400 K/uL 136(L) 126(L) 161    CMP Latest Ref Rng & Units 01/23/2019 01/22/2019 01/21/2019  Glucose 70 - 99 mg/dL 109(H) 91 102(H)  BUN 8 - 23  mg/dL 166(H) 161(H) 168(H)  Creatinine 0.61 - 1.24 mg/dL 3.67(H) 3.97(H) 4.06(H)  Sodium 135 - 145 mmol/L 147(H) 145 142  Potassium 3.5 - 5.1 mmol/L 4.7 5.1 4.6  Chloride 98 - 111 mmol/L 111 109 107  CO2 22 - 32 mmol/L 23 23 22   Calcium 8.9 - 10.3 mg/dL 10.0 10.0 9.8  Total Protein 6.5 - 8.1 g/dL - - -  Total Bilirubin 0.3 - 1.2 mg/dL - - -  Alkaline Phos 38 - 126 U/L - - -  AST 15 - 41 U/L - - -  ALT 0 - 44 U/L - - -    Lab Results  Component Value Date   PTH 29 01/18/2019   CALCIUM 10.0 01/23/2019   PHOS 4.7 (H) 01/23/2019       Component Value Date/Time   COLORURINE YELLOW 01/20/2019 0541   APPEARANCEUR CLOUDY (A) 01/18/2019 0541   LABSPEC 1.010 01/26/2019 0541   PHURINE 6.0 01/29/2019 0541   GLUCOSEU NEGATIVE 01/21/2019 0541   HGBUR LARGE (A) 01/24/2019 0541   BILIRUBINUR NEGATIVE 01/28/2019 0541   KETONESUR NEGATIVE 01/13/2019 0541   PROTEINUR 30 (A) 01/30/2019 0541   NITRITE NEGATIVE  01/24/2019 0541   LEUKOCYTESUR LARGE (A) 02/05/2019 0541   No results found for: PHART, PCO2ART, PO2ART, HCO3, TCO2, ACIDBASEDEF, O2SAT     Component Value Date/Time   IRON 42 (L) 01/20/2019 0314   TIBC 162 (L) 01/20/2019 0314   FERRITIN 516 (H) 01/20/2019 0314   IRONPCTSAT 26 01/20/2019 0314       ASSESSMENT/PLAN:    85 year old male patient with a past medical history significant for hypertension, coronary artery disease, atrial fibrillation, BPH status post prostate surgery, 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 Jimmy Footman at [facility], CKD stage III-IV with a baseline serum creatinine around 1.8-2, renal cell carcinoma status post bilateral nephrectomies of his native kidneys, dementia and severe malnutrition who presents to the emergency department with abdominal pain, weakness, chest pain, and shortness of breath.  He was noted to have acute kidney injury likely secondary to ATN in the setting of septic shock from a urinary tract  infection.  1.  Chronic kidney disease stage III-IV with a baseline serum creatinine around 1.8-2.  He has underlying FSGS and is status post renal transplantation x2.  2.  Acute kidney injury.  Likely on the basis of ATN in the setting of septic shock.  Renal function initially improved but has stagnated with Cr ~4.  PO intake has been poor and I gave NS 545mL bolus yesterday; Cr 3.79 this AM.  He appears to continue to clinically decline.  GOC being addressed currently.  He would not be a long term dialysis candidate.   He's been started on D5W this AM which I changed to D5 1/2NS.      3.  Kidney transplant immunosuppression.  Received stress dose steroids with hydrocortisone due to septic shock and is now back on them due to hypothermia.  Continue cyclosporine.  Myfortic was on hold due to sepsis - will restart at or prior to discharge.  Cyclosporine level reordered 01/21/19 - still pending as of this AM. May need dose adjustment with cardizem discontinuation.  Cyclosporine has been changed to a liquid due to difficulty with pills.  4.  Septic shock due to UTI and pneumonia.  Improved.  S/p azithromycin and now on cefepime.  Foley catheter out, monitoring PVRs.  5.  Hyperkalemia.  Improved.  6.  Anemia due to chronic disease.  Hb 8.2 this AM.  Consider starting ESA if down trending pending GOC.   7.  Severe malnutrition and protein calorie malnutrition.  Continue protein supplements but would not be too aggressive in the setting of a markedly elevated BUN.  Per above, not really eating at all this AM.   8.  Hyperphosphatemia.  Normalized with poor po intake.     Jannifer Hick MD [facility] Pager [phone]  01/23/19 rec'd word from [place] that discussion with family led to transition of care to hospice services which I completely agree is appropriate.  Mr. Shellhammer will be discharged when services arranged.  Nephrology will sign off but please call me if there is any help  I can offer.

## 2019-01-23 NOTE — Progress Notes (Addendum)
Pt rectal temp 95.5. Warm blankets applied. Pt bladder scan showed 371. On call MD, Alfonse Spruce, made aware. Will continue to treat and monitor per MD orders.

## 2019-01-23 NOTE — Consult Note (Signed)
Consultation Note Date: 01/23/2019   Patient Name: [name] [name]  DOB: 1928/01/31  MRN: [id number]  Age / Sex: 85 y.o., male   PCP: Mauricia Area, MD Referring Physician: Oda Kilts, MD   REASON FOR CONSULTATION:Establishing goals of care  Palliative Care consult requested for this 85 y.o. male with multiple medical problems including advanced Alzheimer dementia, CKD IV s/p renal transplant (immunosepression), atrial fibrillation, anemia, CAD, BPH, right orchiectomy, urinary retention s/p cystolitholapaxy, generalized weakness, hyperlipidemia, renal cell carcinoma s/p bilateral nephrectomies, and hypertension.   Clinical Assessment and Goals of Care: I have reviewed medical records including lab results, imaging, Epic notes, and MAR, received report from the bedside RN, and assessed the patient. I spoke with patient's daughter/POA [name] [name] to discuss diagnosis prognosis, [place], EOL wishes, disposition and options. [name] [name] is somewhat lethargic today, minimally responsive to commands. I have assessed him for several days. More awake and alert yesterday.   I re-introduced Palliative Medicine as specialized medical care for people living with serious illness. It focuses on providing relief from the symptoms and stress of a serious illness. The goal is to improve quality of life for both the patient and the family. Patient was under the care of [facility] services prior to admission.   We discussed a brief life review of the patient, along with his functional and nutritional status. Daughter reports patient is originally from [facility] and moved here more than 50 years ago. He is a retired Nature conservation officer. He has a daughter and a son who is greatly involved in his care. Patient resides with his daughter [name] [name]. She reports he loves spending time with family and his friends.   Prior to admission [name] [name] endorses recognizing signs of decline. She reports weight loss,  decreased appetite, worsening dementia, sleeping more, and generalized weakness. He require assistance with ADLs. She reports noticing these changes over the past 6-8 weeks with more a decline over the past several weeks.   We discussed His current illness and what it means in the larger context of His on-going co-morbidities. With specific discussions regarding his worsening renal failure, severe protein calorie malnutrition, dysphagia, hypothermia, and lethargy at times. Natural disease trajectory and expectations at EOL were discussed. [name] [name] verbalized her understanding and again expressed these are consistent with changes she and her brother has noticed over the past several months.   She is tearful in conversation expressing that she knows he is getting tired and "his body is slowly dying". Support given. She expressed she cared for her mom in the home and her father's wishes were to also be in the home if possible around family during his last moment.   I attempted to elicit values and goals of care important to the patient.    The difference between aggressive medical intervention and comfort care was considered in light of the patient's goals of care. Family request to continue to treat while hospitalized with no escalation of care. Daughter expresses goal is to get him back home and allow him to be at peace amongst family for what time he has left.   She confirms patient is a DNR/DNI, no escalation of care.   I educated daughter in detail on transitioning to hospice services from Palliative care. She verbalized understanding of the differences in care and expressed wishes to transition to hospice with a goal of comfort and EOL support upon her father's return from home. I educated daughter on what comfort care would look like and  the support of hospice for symptom management. She verbalized understanding and appreciation. [name] [name] again expresses wishes for hospice support in the home. She  verbalized her father's wishes were to be at home and she was able to provide the care needed with support from hospice (with awareness their care services are not 24/7).   We discussed care in the home and possible need for equipment. Family is requesting a hospital bed and wheelchair in case patient is able to be transferred out of the bed. Daughter states she has a bedside commode. I discussed with her in detail her father's continuous episodes of urinary retention and hematuria. She expressed he has a history of this. Suggested he is discharged home with indwelling foley catheter for comfort. Daughter verbalized understanding and agreement.    Questions and concerns were addressed. The family was encouraged to call with questions or concerns.  PMT will continue to support holistically.   SOCIAL HISTORY:     reports that he has never smoked. He has never used smokeless tobacco. He reports that he does not drink alcohol or use drugs.  CODE STATUS: DNR  ADVANCE DIRECTIVES: Primary Decision Maker: [name] [name]  HCPOA: YES    SYMPTOM MANAGEMENT: per attending   Palliative Prophylaxis:   Aspiration, Bowel Regimen, Delirium Protocol, Frequent Pain Assessment and Oral Care  PSYCHO-SOCIAL/SPIRITUAL:  Support System: Family  Desire for further Chaplaincy support:No  Additional Recommendations (Limitations, Scope, Preferences):  Minimize Medications, No Hemodialysis and continue to treat with no escalation of care   PAST MEDICAL HISTORY: Past Medical History:  Diagnosis Date  . A-fib ([place]   . Alzheimer disease ([place]) 10/31/2018  . Anemia in CKD (chronic kidney disease)   . Arthritis   . Bilateral hydrocele   . CKD (chronic kidney disease), stage II    nephrologist-  deterding  . Diverticulosis   . Dysuria   . H/O kidney transplant    right 1980/  left 1994 with right transplanted kidney removal  . Hematuria   . High cholesterol   . History of adenomatous polyp of colon     VILLOUS ADENOMA POLYP  . History of asbestos exposure   . History of condyloma acuminatum    genital  . 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 History of renal cell carcinoma    S/P  BILATERAL NEPHRECTOMY AND RENAL TRANSPLANTS  . History of small bowel obstruction    12/ 2006  due to adhesions -- resolved without surgerical intervention  . History of TIA (transient ischemic attack)   . Hyperlipidemia   . Hypertension   . Proteinuria   . Renal failure   . Secondary hyperparathyroidism, renal ([place])   . Wears glasses     PAST SURGICAL HISTORY:  Past Surgical History:  Procedure Laterality Date  . COLONOSCOPY  10/17/2012   Procedure: COLONOSCOPY;  Surgeon: Beryle Beams, MD;  Location: [facility];  Service: Endoscopy;  Laterality: N/A;  . CYSTOSCOPY N/A 07/13/2015   Procedure: CYSTOSCOPY;  Surgeon: Irine Seal, MD;  Location: [facility];  Service: Urology;  Laterality: N/A;  . HEMICOLECTOMY Right 1995   adenoma polyp  . HYDROCELE EXCISION Bilateral 07/13/2015   Procedure: RIGHT HYDROCELECTOMY, ADULT DRAINAGE OF LEFT HYDROCELE;  Surgeon: Irine Seal, MD;  Location: [facility];  Service: Urology;  Laterality: Bilateral;  . KIDNEY TRANSPLANT  right 1980; left 1994---  [facility]   right kidney removed 1989/  left kidney  removed 1994    ALLERGIES:  is allergic to ciprofloxacin; clindamycin/lincomycin; levofloxacin; and septra [sulfamethoxazole-trimethoprim].   MEDICATIONS:  Current Facility-Administered Medications  Medication Dose Route Frequency Provider Last Rate Last Dose  . aspirin chewable tablet 81 mg  81 mg Oral Daily Velna Ochs, MD   Stopped at 01/23/19 [phone]  . ceFEPIme (MAXIPIME) 500 mg in dextrose 5 % 50 mL IVPB  500 mg Intravenous Q24H Velna Ochs, MD 100 mL/hr at 01/23/19 0505 500 mg at 01/23/19 0505  . cycloSPORINE (NEORAL) 100 MG/ML microemulsion solution 125 mg  125 mg Oral BID Finnigan, Nancy A, DO    Stopped at 01/23/19 0857  . dextrose 5 %-0.45 % sodium chloride infusion   Intravenous Continuous Justin Mend, MD 50 mL/hr at 01/23/19 1004    . docusate sodium (COLACE) capsule 200 mg  200 mg Oral QHS Velna Ochs, MD   200 mg at 01/22/19 2147  . enoxaparin (LOVENOX) injection 30 mg  30 mg Subcutaneous Q24H Velna Ochs, MD   30 mg at 01/22/19 2200  . famotidine (PEPCID) tablet 20 mg  20 mg Oral QHS Velna Ochs, MD   20 mg at 01/22/19 2147  . feeding supplement (ENSURE ENLIVE) (ENSURE ENLIVE) liquid 237 mL  237 mL Oral TID BM Velna Ochs, MD   Stopped at 01/23/19 0857  . feeding supplement (PRO-STAT SUGAR FREE 64) liquid 30 mL  30 mL Oral TID BM Oda Kilts, MD   Stopped at 01/23/19 0857  . hydrocortisone sodium succinate (SOLU-CORTEF) 100 MG injection 50 mg  50 mg Intravenous Q12H Katherine Roan, MD   50 mg at 01/23/19 0906  . memantine (NAMENDA) tablet 10 mg  10 mg Oral BID Velna Ochs, MD   Stopped at 01/23/19 [phone]  . multivitamin with minerals tablet 1 tablet  1 tablet Oral Daily Oda Kilts, MD   Stopped at 01/23/19 [phone]  . polyethylene glycol (MIRALAX / GLYCOLAX) packet 17 g  17 g Oral Daily PRN Dorrell, Andree Elk, MD   17 g at 01/22/19 4315  . sodium zirconium cyclosilicate (LOKELMA) packet 5 g  5 g Oral Once Velna Ochs, MD      . tamsulosin [facility]) capsule 0.4 mg  0.4 mg Oral QPC supper Velna Ochs, MD   0.4 mg at 01/22/19 1636    VITAL SIGNS: BP 91/73   Pulse 63   Temp (!) 97.5 F (36.4 C) (Rectal)   Resp 19   Ht 5\' 9"  (1.753 m)   Wt 49.9 kg   SpO2 97%   BMI 16.24 kg/m  Filed Weights   01/19/19 0507 01/22/19 0500 01/23/19 0423  Weight: 52.8 kg 50.4 kg 49.9 kg    Estimated body mass index is 16.24 kg/m as calculated from the following:   Height as of this encounter: 5\' 9"  (1.753 m).   Weight as of this encounter: 49.9 kg.  LABS: CBC:    Component Value Date/Time   WBC 8.5 01/22/2019 0314   HGB 8.2 (L)  01/22/2019 0314   HCT 25.7 (L) 01/22/2019 0314   PLT 136 (L) 01/22/2019 0314   Comprehensive Metabolic Panel:    Component Value Date/Time   NA 147 (H) 01/23/2019 0241   K 4.7 01/23/2019 0241   CO2 23 01/23/2019 0241   BUN 166 (H) 01/23/2019 0241   CREATININE 3.67 (H) 01/23/2019 0241   ALBUMIN 2.0 (L) 01/23/2019 0241     Review of Systems  Unable to perform ROS: Acuity of condition  Physical Exam Vitals signs and nursing note reviewed.  Constitutional:      Appearance: He is cachectic.  Cardiovascular:     Rate and Rhythm: Normal rate and regular rhythm.     Pulses: Normal pulses.     Heart sounds: Normal heart sounds.  Pulmonary:     Effort: Pulmonary effort is normal.     Breath sounds: Decreased breath sounds present.     Comments: Mouth breathing  Abdominal:     General: Bowel sounds are decreased.     Palpations: Abdomen is soft.  Skin:    General: Skin is warm and dry.  Neurological:     Mental Status: He is easily aroused. He is lethargic.     Motor: Weakness and atrophy present.  Psychiatric:        Cognition and Memory: Cognition is impaired. Memory is impaired.        Judgment: Judgment is inappropriate.    Prognosis: Days to Weeks in the setting of severe protein calorie malnutrition, cachetic, advanced dementia, CKD IV, s/p kidney transplant x 2, hematuria, decreased mobility, renal cell carcinoma s/p bilateral nephrectomies, septic shock due to UTI and pneumonia  Discharge Planning:  Home with Hospice at family's request. Daughter, [name] [name] expressed need for hospital bed and wheelchair.   Recommendations:  DNR/DNI-as confirmed by daughter/poa  Continue to treat while hospitalized with goal of returning home with hospice support and transition to a more comfort approach. No escalation of care. Family wishes are for patient to be home around family and friends for EOL.   Case Management referral for outpatient hospice. Family expressed the need for  hospital bed, non-emergent ems transport home, and wheelchair.   Will d/c home with foley for comfort. Daughter is aware and in agreement.   PMT will continue to follow and support as needed.    Palliative Performance Scale: PPS 20%               Family [name] [name]) expressed understanding and was in agreement with this plan.   Thank you for allowing the Palliative Medicine Team to assist in the care of this patient.  Time In: 1035 Time Out: 1150 Time Total: 75 min   Visit consisted of counseling and education dealing with the complex and emotionally intense issues of symptom management and palliative care in the setting of serious and potentially life-threatening illness.Greater than 50%  of this time was spent counseling and coordinating care related to the above assessment and plan.  Signed by:  Alda Lea, AGPCNP-BC Palliative Medicine Team  Phone: [phone] Fax: [phone] Pager: [phone] Amion: Bjorn Pippin

## 2019-01-23 NOTE — Progress Notes (Signed)
Pt found to have a moderate amount of blood tinged urine when bathing pt. Also, small blood clots found around pts penis from urine. On call MD, Alfonse Spruce, made aware. Will continue to monitor.

## 2019-01-24 LAB — RENAL FUNCTION PANEL
Albumin: 2 g/dL — ABNORMAL LOW (ref 3.5–5.0)
Anion gap: 15 (ref 5–15)
BUN: 172 mg/dL — ABNORMAL HIGH (ref 8–23)
CO2: 19 mmol/L — ABNORMAL LOW (ref 22–32)
Calcium: 9.8 mg/dL (ref 8.9–10.3)
Chloride: 113 mmol/L — ABNORMAL HIGH (ref 98–111)
Creatinine, Ser: 3.91 mg/dL — ABNORMAL HIGH (ref 0.61–1.24)
GFR calc Af Amer: 15 mL/min — ABNORMAL LOW (ref >60)
GFR calc non Af Amer: 13 mL/min — ABNORMAL LOW (ref >60)
Glucose, Bld: 131 mg/dL — ABNORMAL HIGH (ref 70–99)
Phosphorus: 5.2 mg/dL — ABNORMAL HIGH (ref 2.5–4.6)
Potassium: 5 mmol/L (ref 3.5–5.1)
Sodium: 147 mmol/L — ABNORMAL HIGH (ref 135–145)

## 2019-01-24 LAB — CBC
HCT: 24.4 % — ABNORMAL LOW (ref 39.0–52.0)
Hemoglobin: 7.6 g/dL — ABNORMAL LOW (ref 13.0–17.0)
MCH: 28.9 pg (ref 26.0–34.0)
MCHC: 31.1 g/dL (ref 30.0–36.0)
MCV: 92.8 fL (ref 80.0–100.0)
Platelets: 175 10*3/uL (ref 150–400)
RBC: 2.63 MIL/uL — ABNORMAL LOW (ref 4.22–5.81)
RDW: 20.4 % — ABNORMAL HIGH (ref 11.5–15.5)
WBC: 5.2 10*3/uL (ref 4.0–10.5)
nRBC: 0 % (ref 0.0–0.2)

## 2019-01-24 MED ORDER — DEXTROSE-NACL 5-0.45 % IV SOLN
INTRAVENOUS | Status: AC
  Administered 2019-01-24: 09:00:00 via INTRAVENOUS

## 2019-01-24 MED ORDER — BIOTENE DRY MOUTH MT LIQD
15.0000 mL | OROMUCOSAL | Status: DC | PRN
  Administered 2019-01-25: 15 mL via TOPICAL
  Filled 2019-01-24: qty 15

## 2019-01-24 MED ORDER — POLYVINYL ALCOHOL 1.4 % OP SOLN
1.0000 [drp] | Freq: Four times a day (QID) | OPHTHALMIC | Status: DC | PRN
  Filled 2019-01-24: qty 15

## 2019-01-24 MED ORDER — HYDROMORPHONE HCL 1 MG/ML IJ SOLN
0.5000 mg | INTRAMUSCULAR | Status: DC | PRN
  Administered 2019-01-24 – 2019-01-25 (×2): 0.5 mg via INTRAVENOUS
  Filled 2019-01-24 (×2): qty 0.5

## 2019-01-24 MED ORDER — ONDANSETRON HCL 4 MG/2ML IJ SOLN: 4.0000 mg | Freq: Four times a day (QID) | INTRAMUSCULAR | Status: DC | PRN

## 2019-01-24 MED ORDER — HYDROMORPHONE HCL 1 MG/ML IJ SOLN: 0.5000 mg | Freq: Once | INTRAMUSCULAR | Status: DC

## 2019-01-24 MED ORDER — GLYCOPYRROLATE 0.2 MG/ML IJ SOLN
0.2000 mg | INTRAMUSCULAR | Status: DC | PRN
  Administered 2019-01-24 – 2019-01-25 (×2): 0.2 mg via INTRAVENOUS
  Filled 2019-01-24 (×2): qty 1

## 2019-01-24 NOTE — Progress Notes (Signed)
Patient  with difficulty swallowing PO meds. Pt unable to clear the throat with coughing noted.. Please consider switching to IV meds if possible

## 2019-01-24 NOTE — Progress Notes (Addendum)
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 Chenier is in our community based palliative care program at home.  ACC will follow and update team as discharge plans continue.  Thank you, Venia Carbon RN, BSN, [facility] Liaison  [phone]  **Spoke with PMT, pt now ready to transition to hospice support at home.  Pt will need the following:  Bed, wheelchair, oxygen and hoyer lift.  ACC will order this equipment.  Spoke with [name], she feels that she will not have the house ready to receive the DME until tonight, and would prefer he be sent home in am after DME delivered.  Will update Case Manager.  **Update, pt approved for hospice services at home.  ACC will check in am to see if pt will be stable to transfer home.

## 2019-01-24 NOTE — Care Management Important Message (Signed)
Important Message  Patient Details  Name: [name] [name] MRN: [id number] Date of Birth: January 07, 1928   Medicare Important Message Given:  Yes    Memory Argue 01/24/2019, 2:12 PM

## 2019-01-24 NOTE — Progress Notes (Signed)
Unable to give an oral medications this morning due to patient unable to swallow. Pills crushed in applesauce and placed in mouth, however patient pocketed it and did not swallow. Applesauce had to be suctioned out of mouth. Patient currently on 6L [place] with saturations 87-88%. Dr. Koleen Distance at bedside and aware of situation. Will continue to moniotr.  Hiram Comber, RN 01/24/2019 10:28 AM

## 2019-01-24 NOTE — Progress Notes (Signed)
Pharmacy Antibiotic Note  [name] [name] is a 85 y.o. male admitted on 01/26/2019 with sepsis.  Pharmacy has been consulted for cefepime dosing.  Currently treating for PNA and UTI. Urine cultures positive for PsA and e.coli (S-cefepime). Scr stable at 3.91 (CrCl<10). Anticipate end date for abx at 5/17 per attending note     Plan: Continue cefepime 500mg  IV q24h Monitor renal fx, clinical pic,   Height: 5\' 9"  (175.3 cm) Weight: 112 lb (50.8 kg) IBW/kg (Calculated) : 70.7  Temp (24hrs), Avg:97.3 F (36.3 C), Min:96.5 F (35.8 C), Max:97.6 F (36.4 C)  Recent Labs  Lab 01/19/19 0428 01/20/19 0314 01/20/19 1059 01/21/19 0331 01/22/19 0314 01/23/19 0241 01/24/19 0234  WBC 5.4 5.6  --  8.3 8.5  --  5.2  CREATININE 4.41* 4.03* 4.05* 4.06* 3.97* 3.67* 3.91*    Estimated Creatinine Clearance: 9.4 mL/min (A) (by C-G formula based on SCr of 3.91 mg/dL (H)).    Allergies  Allergen Reactions  . Ciprofloxacin Rash  . Clindamycin/Lincomycin Rash  . Levofloxacin Rash  . Septra [Sulfamethoxazole-Trimethoprim] Rash    Antimicrobials this admission: Vanc 5/6 x1 Metronidazole 5/6 x1  Cefepime 5/6>>(5/17) Azith 5/6 >>5/10  Dose adjustments this admission: N/A  Microbiology results: Ucx 5/6: Pseudomonas + Ecoli (both pan sensitive) COVID 5/6: negative Bcx 5/6 x2: ngtd   [name] [name] A. Levada Dy, PharmD, [place] Please utilize Amion for appropriate phone number to reach the unit pharmacist ([place])   01/24/2019 9:53 AM

## 2019-01-24 NOTE — Progress Notes (Signed)
  Date: 01/24/2019  Patient name: [name] [name]  Medical record number: [id number]  Date of birth: Sep 08, 1928   I have seen and evaluated this patient and I have discussed the plan of care with the house staff. Please see their note for complete details. I concur with their findings with the following additions/corrections:  Significant deterioration in the past 48 hours.  He is no longer able to swallow.  This morning he appears fatigued and ill.  Breathing is labored with poor clearance of his airway.  Requiring 6 L O2 by [place].  Ongoing hypothermia, requiring Retail banker.  Unfortunately, I think he likely does not have long to live.  Plan was for discharge home with hospice, but he may not make it.  We have discussed with palliative care, and they recommend transitioning to comfort care and calling in the family.  We will make an exception to the visitor policy as he appears to be entering the dying process.  If he is able to survive until the morning, he may be able to go home with hospice tomorrow.  Lenice Pressman, M.D., Ph.D. 01/24/2019, 2:06 PM

## 2019-01-24 NOTE — Progress Notes (Signed)
Daily Progress Note   Patient Name: [name] [name]       Date: 01/24/2019 DOB: 1927-11-29  Age: 85 y.o. MRN#: [id number] Attending Physician: Oda Kilts, MD Primary Care Physician: Mauricia Area, MD Admit Date: 01/10/2019  Reason for Consultation/Follow-up: Establishing goals of care, Non pain symptom management, Pain control and Psychosocial/spiritual support  Subjective: Patient is lethargic today. He was more awake on yesterday. Will arouse at name and open eyes. He responds that he "aches all over" when asked about pain. Some use of accessory muscles. Requiring increased oxygen at 6L/[place]. Also requiring Bare Hugger for temperature regulation. He continues to show signs of further decline.   I spoke with daughter, [name] at length regarding continuous signs of decline. She is tearful and verbalizes understanding of his condition. She expresses neither she or her brother wants their father to suffer. She expressed their goal is for him to be comfortable and was hopeful he could return home and be amongst family in the space his wife also passed away in. Support given. Sarita aware patient returning home with hospice support will depend on if patient does not continue to show signs of decline and instability. The medical team would not want to transfer him home and he passed away in the EMS while in transport. She verbalized understanding and appreciation for concern. She expressed that she and her brother would not want this either and she would like to call and speak with him about their father's decline.   [name] called her brother [place] and updates were provided. They are both in agreement to proceed with comfort care while hospitalized. They remain hopeful he can make it back home but  also prepared for a hospital death also. They expressed "at this point we just want him comfortable and to be with him!" Support given. Family aware given his change in condition and transition to comfort they will be allowed to visit at the bedside for EOL support.   I educated family on what comfort measures would look like. They are aware all medical interventions not comfort focused will be discontinued. Medications will be available for symptom mangement such as for pain, shortness of breath, anxiety, and nausea. Family verbalized understanding and appreciation.   I briefly screened family members by asking if they were experiencing any symptoms of COVID-19  or have come into contact with any individuals who have known COVID. Family denied. They are aware of the process of entering the hospital and remaining at the bedside with patient until ready to leave the facility.   All questions answered. Family is preparing to come and be at the bedside with patient.   Length of Stay: 9  Current Medications: Scheduled Meds:  .  HYDROmorphone (DILAUDID) injection  0.5 mg Intravenous Once  . tamsulosin  0.4 mg Oral QPC supper    Continuous Infusions: . dextrose 5 % and 0.45% NaCl 50 mL/hr at 01/24/19 0853    PRN Meds: antiseptic oral rinse, glycopyrrolate, HYDROmorphone (DILAUDID) injection, ondansetron (ZOFRAN) IV, polyvinyl alcohol  Physical Exam       Thin, frail, cachetic, chronically ill appearing, Lethargic, about arousable. Confused (baseline dementia), Will respond but then closes eyes. Some use of accessory muscles, 6L/[place], tachypnea, RRR, generalized weakness   Vital Signs: BP 125/80   Pulse 94   Temp 97.6 F (36.4 C) (Axillary)   Resp (!) 25   Ht 5\' 9"  (1.753 m)   Wt 50.8 kg   SpO2 91%   BMI 16.54 kg/m  SpO2: SpO2: 91 % O2 Device: O2 Device: Nasal Cannula O2 Flow Rate: O2 Flow Rate (L/min): 6 L/min  Intake/output summary:   Intake/Output Summary (Last 24 hours) at  01/24/2019 1346 Last data filed at 01/24/2019 0845 Gross per 24 hour  Intake 456.48 ml  Output 650 ml  Net -193.52 ml   LBM: Last BM Date: 02/02/2019 Baseline Weight: Weight: 60 kg Most recent weight: Weight: 50.8 kg       Palliative Assessment/Data:PPS 20%   Flowsheet Rows     Most Recent Value  Intake Tab  Unit at Time of Referral  Med/Surg Unit  Date Notified  01/21/19  Palliative Care Type  Return patient Palliative Care  Reason for referral  Clarify Goals of Care  Date of Admission  01/14/2019  # of days IP prior to Palliative referral  6  Clinical Assessment  Psychosocial & Spiritual Assessment  Palliative Care Outcomes      Patient Active Problem List   Diagnosis Date Noted  . Weakness   . Adrenal insufficiency ([place])   . Acute cystitis without hematuria   . Renal failure 01/12/2019  . Sepsis ([place]) 02/06/2019  . Pyelonephritis of transplanted kidney 01/17/2019  . CAP (community acquired pneumonia) 01/24/2019  . Alzheimer disease ([place]) 10/31/2018  . Retention of urine   . Acute lower UTI   . Hypoalbuminemia   . Dementia with behavioral disturbance ([place])   . Palliative care by specialist   . Advanced care planning/counseling discussion   . Goals of care, counseling/discussion   . Pressure injury of skin 10/01/2018  . ARF (acute renal failure) ([place]) 09/30/2018  . Bone lesion 03/27/2018  . AAA (abdominal aortic aneurysm) ([place]) 03/27/2018  . BPH (benign prostatic hyperplasia) 03/27/2018  . Weight loss 03/27/2018  . Hypotension 03/27/2018  . Unspecified atrial fibrillation ([place]) 03/27/2018  . Protein-calorie malnutrition, severe 03/27/2018  . SIRS (systemic inflammatory response syndrome) ([place]) 03/26/2018    Palliative Care Assessment & Plan   Patient Profile: Palliative Care consult requested for this 85 y.o. male with multiple medical problems including advanced Alzheimer dementia, CKD IV s/p renal transplant (immunosepression), atrial fibrillation, anemia,  CAD, BPH, right orchiectomy, urinary retention s/p cystolitholapaxy, generalized weakness, hyperlipidemia, renal cell carcinoma s/p bilateral nephrectomies, and hypertension.   Assessment: Continues to show signs of decline, poor po intake,  dysphagia, lethargy, now with increased respiratory distress requiring 6L/[place], hypothermic on bare hugger.   Recommendations/Plan:  DNR/DNI-as confirmed by family  Transition care to comfort   Pending home equipment delivery and hospice set-up in home, however, patient continues to show signs of decline and may have a hospital death. Family aware depending on stability of symptoms may be able to discharge home with hospice over the weekend.   Family approved to visit at bedside in the setting of EOL for comfort and support.   Will d/c orders not comfort focused  Robinul PRN for excessive secretion  Dilaudid PRN for shortness of breath/pain  Zofran PRN for nausea  Wean oxygen down to 2L/[place] for comfort, no escalation  Continue foley for EOL support   PMT will continue to support and follow   Goals of Care and Additional Recommendations:  Limitations on Scope of Treatment: Full Comfort Care  Code Status:    Code Status Orders  (From admission, onward)         Start     Ordered   01/24/19 1341  Do not attempt resuscitation (DNR)  Continuous    Question Answer Comment  In the event of cardiac or respiratory ARREST Do not call a "code blue"   In the event of cardiac or respiratory ARREST Do not perform Intubation, CPR, defibrillation or ACLS   In the event of cardiac or respiratory ARREST Use medication by any route, position, wound care, and other measures to relive pain and suffering. May use oxygen, suction and manual treatment of airway obstruction as needed for comfort.      01/24/19 1342        Code Status History    Date Active Date Inactive Code Status Order ID Comments User Context   01/10/2019 1045 01/24/2019 1342 DNR  [id number]  Velna Ochs, MD Inpatient   10/03/2018 1138 10/05/2018 1727 DNR [id number]  Earlie Counts, NP Inpatient   03/26/2018 2320 03/29/2018 1954 Full Code [id number]  Norval Morton, MD ED     Prognosis:   Hours - Days  Discharge Planning:  Home with Hospice versus anticipated hospital death.    Care plan was discussed with [name] (daughter/poa), [name] Malta, RN [facility]), bedside RN, and Dr. Koleen Distance.  Thank you for allowing the Palliative Medicine Team to assist in the care of this patient.  Total Time: 55 min.   Greater than 50%  of this time was spent counseling and coordinating care related to the above assessment and plan.  Alda Lea, AGPCNP-BC Palliative Medicine Team  Phone: [phone] Pager: [phone] Amion: Bjorn Pippin    Please contact Palliative Medicine Team phone at [phone] for questions and concerns.

## 2019-01-24 NOTE — Progress Notes (Signed)
   Subjective: Nursing noted that [name] [name] was not swallowing medications this morning and required increased oxygen to maintain O2 sats.  When we saw him at bedside, [name] [name] was somewhat interactive this morning. He was asking for his daughter and complained of pain "all over." We assured him that we will make him as comfortable as possible and are working to get him home with his family.   Objective:  Vital signs in last 24 hours: Vitals:   01/23/19 1615 01/23/19 2014 01/24/19 0418 01/24/19 0500  BP: 135/85 (!) 157/99 91/64   Pulse: 81 71    Resp: 16 12 (!) 27   Temp: (!) 97.5 F (36.4 C) (!) 96.5 F (35.8 C) (!) 97.5 F (36.4 C)   TempSrc: Rectal Rectal Oral   SpO2: 100% 100% (!) 88%   Weight:    50.8 kg  Height:       General: cachectic, elderly gentleman, appears uncomfortable HEENT: dry mucous membranes CV: RRR Abd: thin, non-tender Ext: No edema   Assessment/Plan:  Principal Problem:   Sepsis (HCC) Active Problems:   BPH (benign prostatic hyperplasia)   Dementia with behavioral disturbance (HCC)   Renal failure   Pyelonephritis of transplanted kidney   CAP (community acquired pneumonia)   Adrenal insufficiency (HCC)   Acute cystitis without hematuria   Weakness  [name] [name] is an 85 yo man with a medical history ofalzheimer's dementia, CKD IV s/p renal transplant in 1994 on chronic immunosuppression, CAD, atrial fibrillation, chronic anemia, BPH, urinary rentention, s/pcystolitholapaxy, rightorchiectomy, prostatectomy,and chronic hematuriawho presented with sepsis in the setting of UTI and RUL pneumonia.Unfortunately, [name] [name] has continued to decline despite aggressive medical management. Palliative care team discussed with his daughter, [name] [name]. Given poor prognosis, she would like to transition him home with hospice services. Appreciate palliative care and case manager's assistance coordinating this.   Sepsis RULpneumonia E coli and pseudomonas UTI  Hypothermia - remains on bare hugger to maintain temperature - continuing IV antibiotics for now until transitions to comfort care  Urinary retention BPH - will continue foley placement for comfort  Pharyngeal and esophageal dysphagia - ability to swallow fluctuates with mental status - continue dysphagia 1 diet and assistance with meals   GOC: - greatly appreciate palliative care consult - family wishes to not escalate care, but continue current therapy while hospitalized - they prefer to take him home with hospice services - given his condition today, I am concerned he may not be able to be transitioned home. Will continue to monitor his status and ensure family is able to come visit if his condition continues to decline - IV dilaudid for pain control; will hold off on full comfort until further family discussion  Dispo: Anticipated discharge home with hospice tomorrow.   Modena Nunnery D, DO 01/24/2019, 7:00 AM Pager: [phone]

## 2019-01-25 DIAGNOSIS — I469 Cardiac arrest, cause unspecified: Secondary | ICD-10-CM

## 2019-02-10 NOTE — Progress Notes (Signed)
Spoke to patient's daughter [name] who stated she would like to come see the patient prior to transfer to morgue.    Hiram Comber, RN 2019-02-02 9:56 AM

## 2019-02-10 NOTE — Death Summary Note (Signed)
  Name: Jaeger Trueheart MRN: [id number] DOB: November 15, 1927 85 y.o.  Date of Admission: 01/31/2019  4:53 AM Date of Discharge: 02-07-19 Attending Physician: Oda Kilts, MD   Discharge Diagnosis: Principal Problem:   Sepsis [facility]) Active Problems:   BPH (benign prostatic hyperplasia)   Dementia with behavioral disturbance ([place])   Renal failure   Pyelonephritis of transplanted kidney   CAP (community acquired pneumonia)   Adrenal insufficiency ([place])   Acute cystitis without hematuria   Weakness  Cause of death: cardiopulmonary arrest   Time of death: 6:53 am  Disposition and follow-up:   Mr.[name] [name] was discharged from [facility] in expired condition.    Hospital Course: Mr. Leser is an 85 yo man with a medical history ofalzheimer's dementia, CKD IV s/p renal transplant in 1994 on chronic immunosuppression, CAD, atrial fibrillation, chronic anemia, BPH, urinary rentention, s/pcystolitholapaxy, rightorchiectomy, prostatectomy,and chronic hematuriawho presented with sepsis in the setting of UTI and RUL pneumonia.Unfortunately, Mr. Lortie has continued to decline despite aggressive medical management. Palliative care team discussed with his daughter, [name]. Given poor prognosis, family elected to transition him to comfort care. He expired comfortably within 24 hours.    SignedDelice Bison, DO 01/28/2019, 1:38 PM

## 2019-02-10 NOTE — Progress Notes (Signed)
Patient expired at [id number].  Time of Death was pronounced by this Probation officer and  Celene Skeen RN.  Pt's daughter Emon Lance was informed about the unfortunate incident and advised to call the unit for any inquiries.  Attending , Mendel Corning was notified.

## 2019-02-10 DEATH — deceased

## 2019-05-01 ENCOUNTER — Ambulatory Visit: Payer: Medicare Other | Admitting: Neurology

## 2020-05-16 IMAGING — DX DG CHEST 1V PORT
1 series · 2 of 2 positions shown · non-contrast
Comparison: 03/26/2018 and chest CT dated 07/22/2018.

CLINICAL DATA: Possible seizure today. Fever of 102 since this
afternoon.

EXAM:
PORTABLE CHEST 1 VIEW

[Series 1: chest · 0.14mm/px · 2 of 2 slices shown]
[im 1/2]
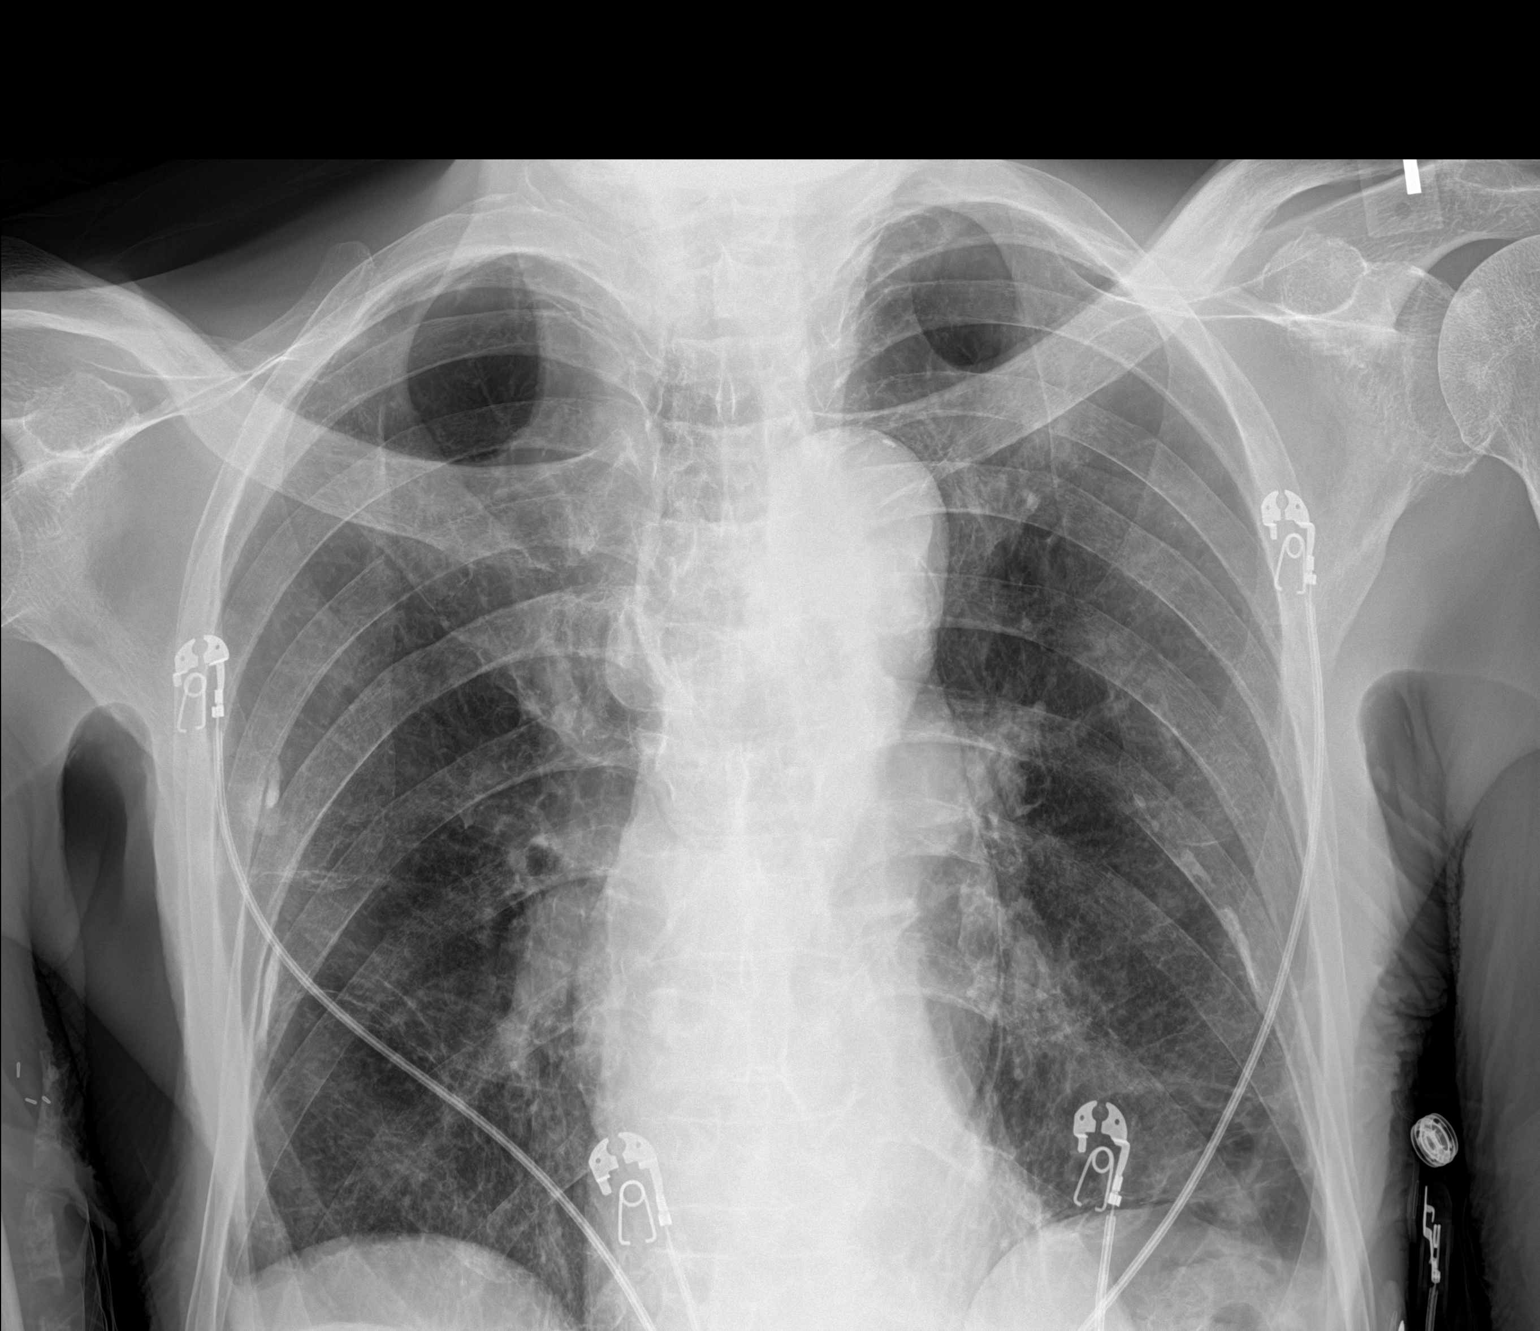
[im 2/2]
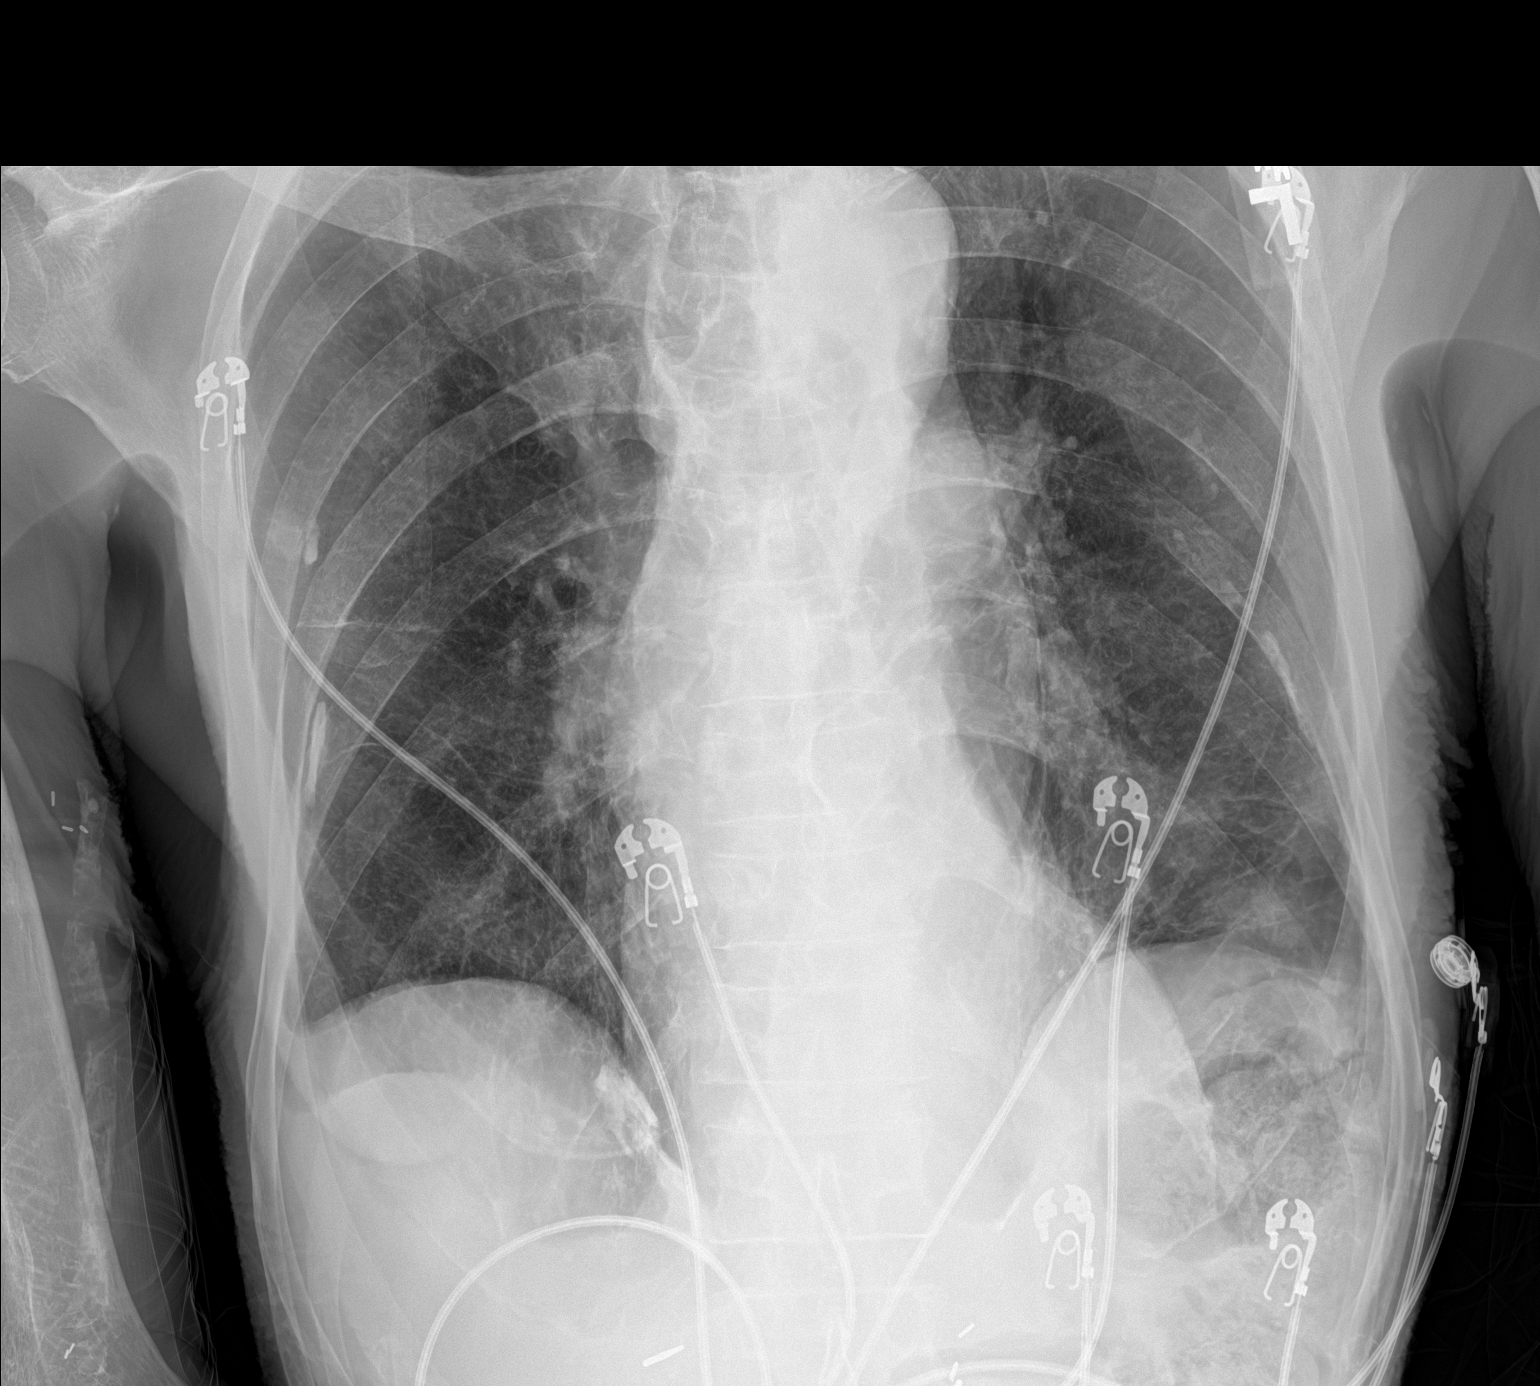

[2 of 2 positions shown; findings below may reference images not displayed]

FINDINGS: Normal sized heart. Tortuous and mildly calcified thoracic aorta.
The lungs remain hyperexpanded with bullous changes and previously
demonstrated bilateral calcified pleural plaques. Careful poorly
defined rounded nodular density at the left lung base. The
interstitial markings remain mildly prominent. Diffuse osteopenia.
IMPRESSION: 1. Interval poorly defined rounded nodular density at the left lung
base. This could represent a true nodule or rounded focus of
pneumonia or atelectasis. This could be further evaluated with a
chest CT with contrast.
2. Stable changes of COPD and bilateral calcified pleural plaques
compatible with previous asbestos exposure.

## 2020-05-17 IMAGING — CT CT CHEST W/O CM
2 of 3 series · 15 of 36 positions shown, 18 images · non-contrast
Comparison: CT chest 07/22/2018

CLINICAL DATA: Evaluate for lung nodule.

EXAM:
CT CHEST WITHOUT CONTRAST
TECHNIQUE: Multidetector CT imaging of the chest was performed following the
standard protocol without IV contrast.

[Series 4: thorax 2.0 · axial · 0.72mm/px · z∈[-292,+22]mm · 12 of 185 slices shown, 15 images]
[im 14/185  mediastinal]
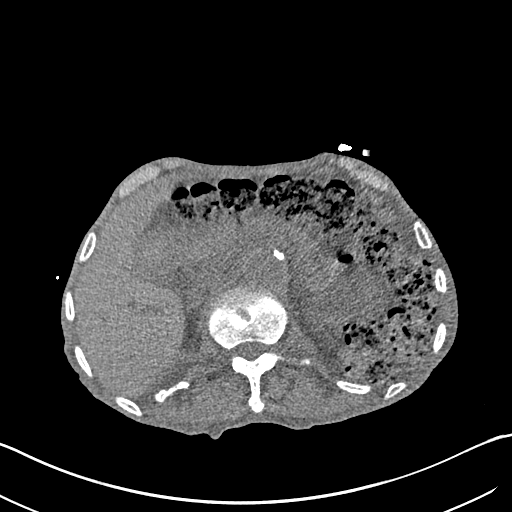
[im 14/185  lung]
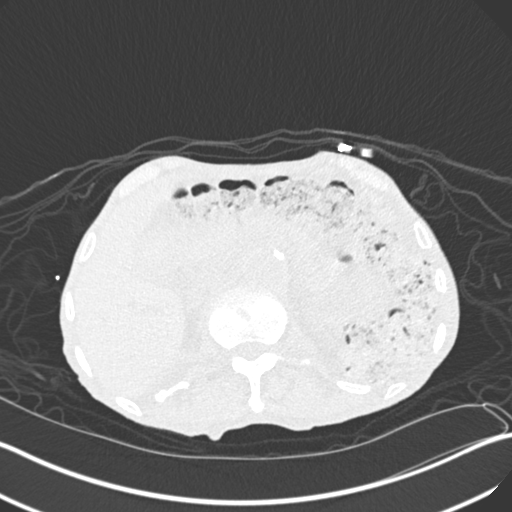
[im 28/185  lung]
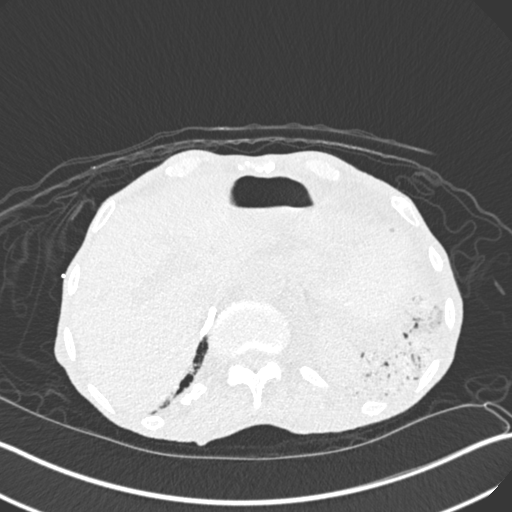
[im 41/185  lung]
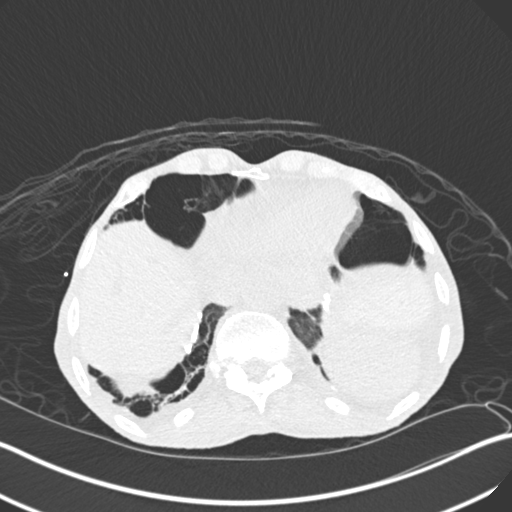
[im 55/185  lung]
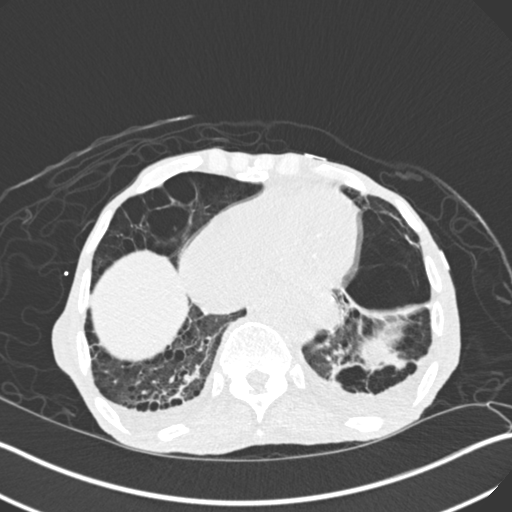
[im 69/185  mediastinal]
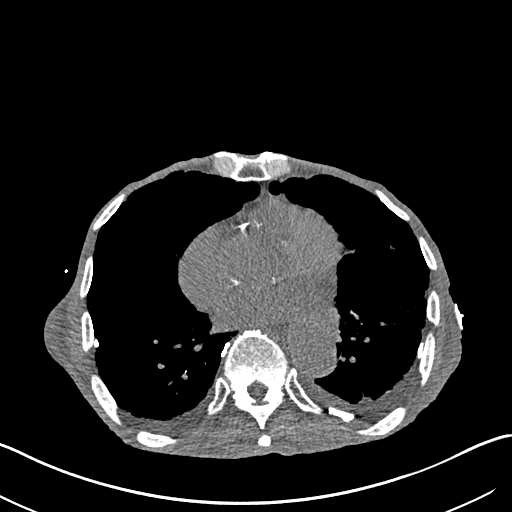
[im 69/185  lung]
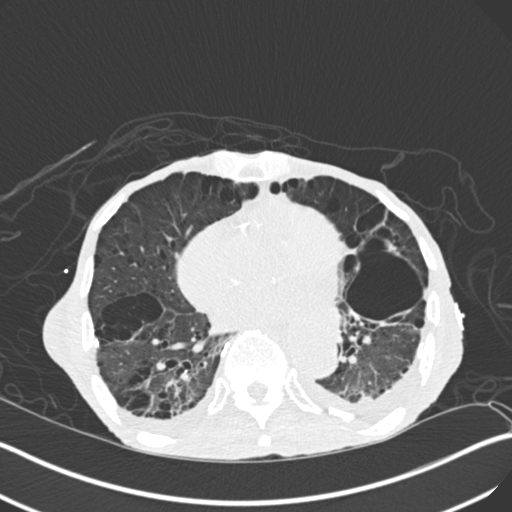
[im 82/185  lung]
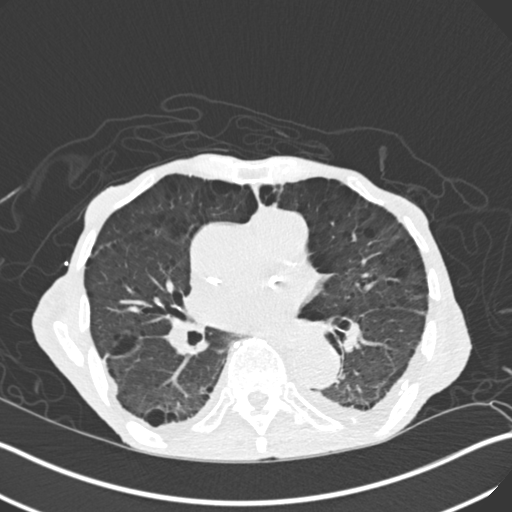
[im 103/185  lung]
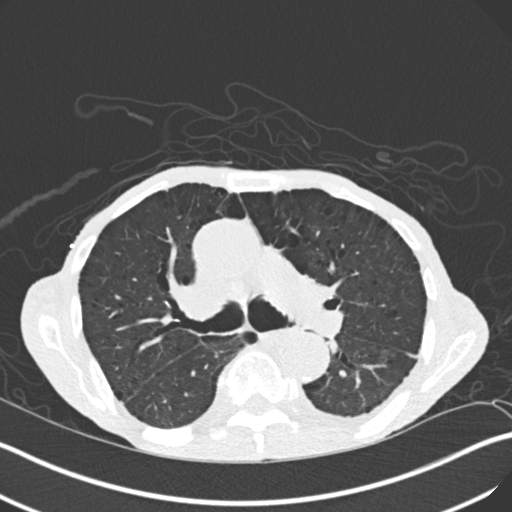
[im 116/185  lung]
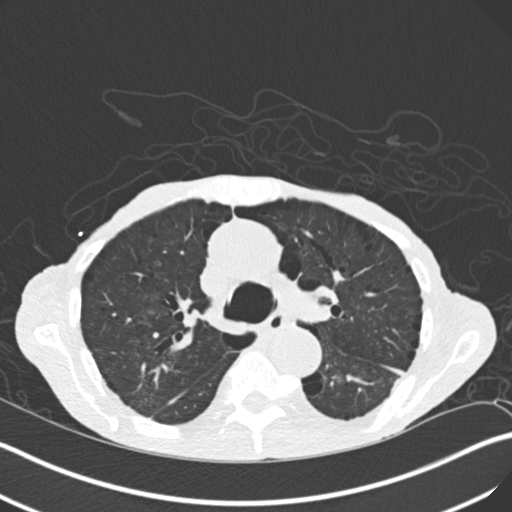
[im 130/185  mediastinal]
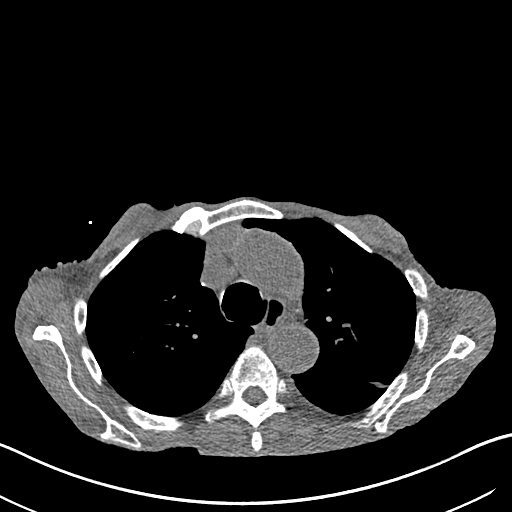
[im 130/185  lung]
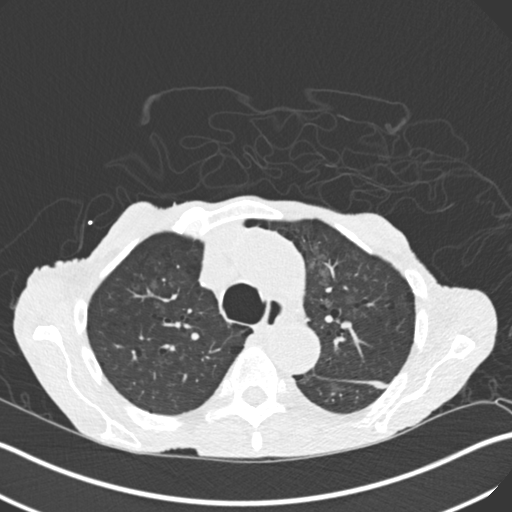
[im 144/185  lung]
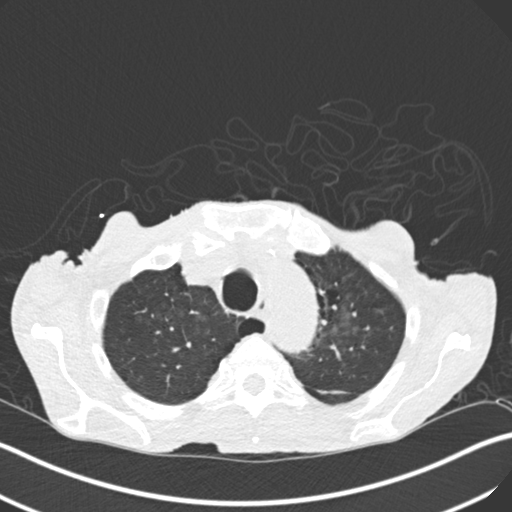
[im 157/185  lung]
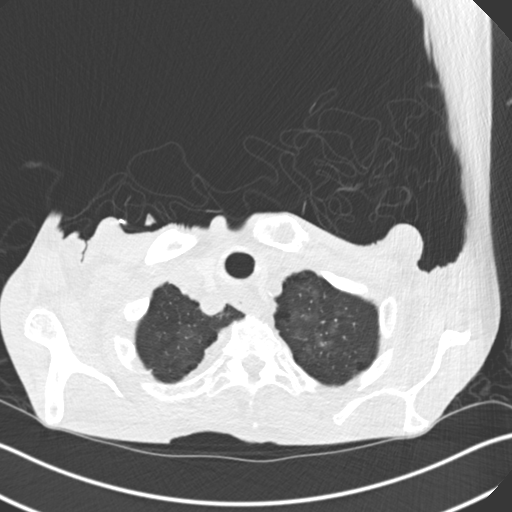
[im 171/185  lung]
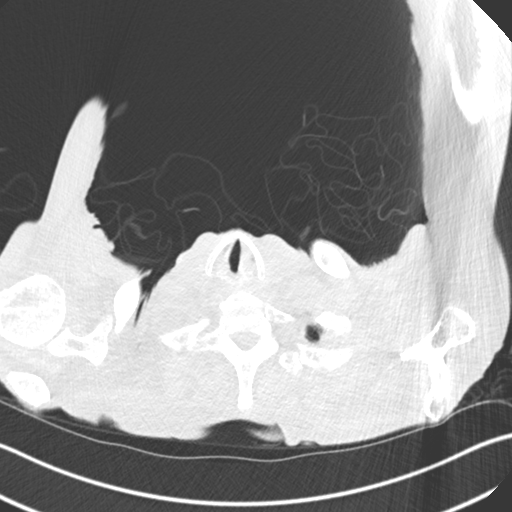

[Series 7: coronal · coronal · 0.62mm/px · 3 of 86 slices shown]
[im 18/86  lung]
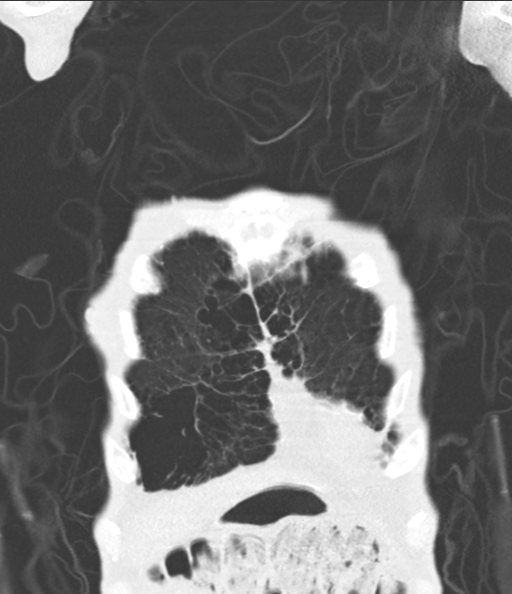
[im 35/86  lung]
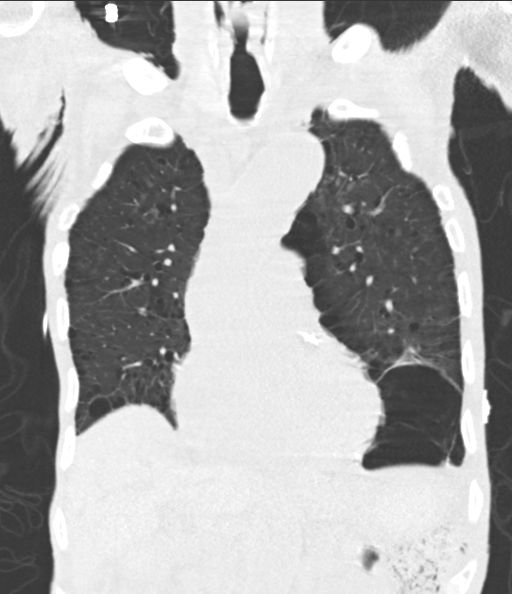
[im 52/86  lung]
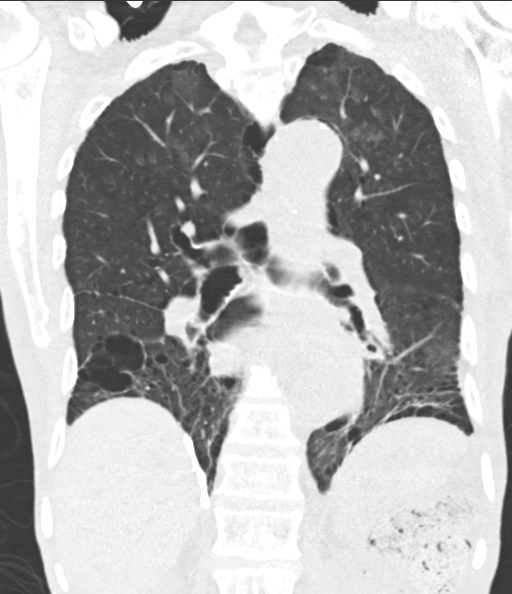

[15 of 36 positions shown; findings below may reference images not displayed]

FINDINGS: Cardiovascular: Normal heart size. No pericardial effusion. Aortic
atherosclerosis. Aneurysmal dilatation of the ascending thoracic
aorta measures up to 4.3 cm. Multi vessel coronary artery
atherosclerotic calcifications including left main disease noted.

Mediastinum/Nodes: Normal appearance of the thyroid gland. The
trachea appears patent and is midline. Small hiatal hernia
suspected. No mediastinal adenopathy. No enlarged axillary lymph
nodes.

Lungs/Pleura: There are small bilateral pleural effusions which
appear new from previous exam. Bilateral pleural calcifications are
identified compatible with prior asbestos exposure. Advanced changes
of emphysema noted. Patchy peribronchovascular predominant areas of
mild
ground-glass attenuation, most evident throughout the mid to upper
lungs bilaterally, similar compared to the prior study. Scattered
calcified granulomas are noted in the lungs bilaterally.
Emphysematous bulla within the posterior left lower lobe has a new
nodular filling defect measuring 1.4 cm, image 77/8 and corresponds
to the nodular density identified on recent chest radiograph.

Upper Abdomen: No acute abnormality.

Musculoskeletal: No chest wall mass or suspicious bone lesions
identified. The patient appears diffusely cachectic.
IMPRESSION: 1. Asbestos related pleural disease redemonstrated. New small
bilateral pleural effusions are identified.
2. Diffuse bronchial wall thickening with emphysema, as above;
imaging findings suggestive of underlying COPD. Nonspecific mild
peribronchovascular predominant ground-glass attenuation within the
mid and upper lung zones bilaterally, stable compared to previous
exam.
3. New nodular density filling a posterior left lower lobe
emphysematous bulla is likely postinflammatory or infectious in
etiology. Recommend followup imaging in 3 months with repeat CT of
the chest to ensure resolution.
4. Aortic atherosclerosis as well as left main and 3 vessel coronary
artery disease.
5. Aortic Atherosclerosis (S5P90-Y3S.S) and Emphysema (S5P90-Q4G.U).

## 2020-08-31 IMAGING — CT CT HEAD WITHOUT CONTRAST
4 series · 16 of 47 positions shown, 18 images · non-contrast
Comparison: 11/07/2018

CLINICAL DATA: Altered mental status with unclear cause

EXAM:
CT HEAD WITHOUT CONTRAST
TECHNIQUE: Contiguous axial images were obtained from the base of the skull
through the vertex without intravenous contrast.

[Series 3: head wo · axial · 0.43mm/px · z∈[-78,+42]mm · 7 of 34 slices shown, 9 images]
[im 5/34  brain]
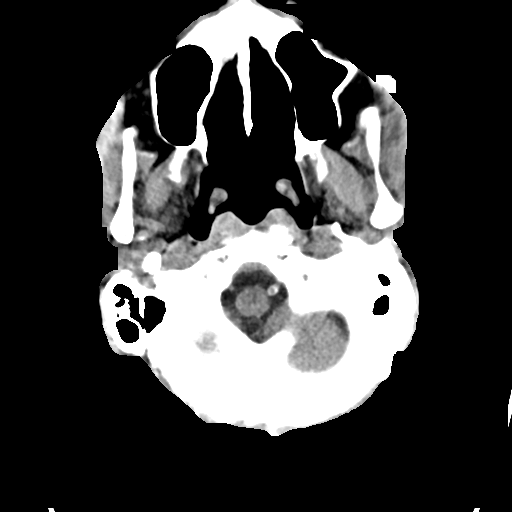
[im 5/34  bone]
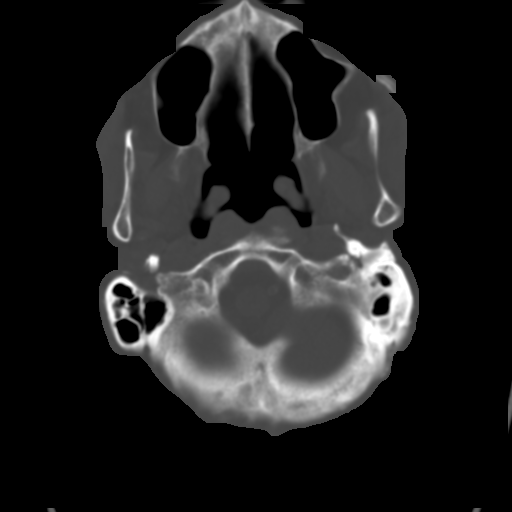
[im 9/34  brain]
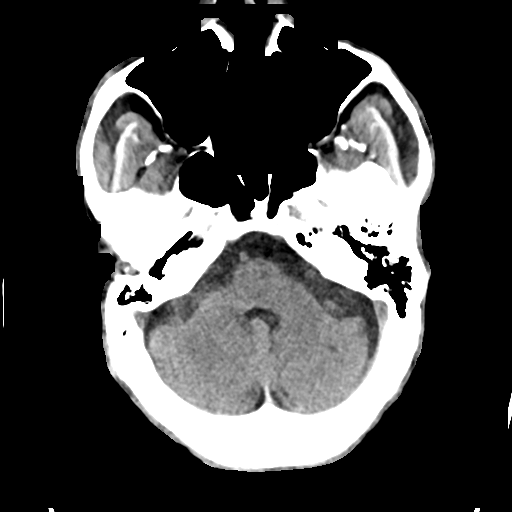
[im 13/34  brain]
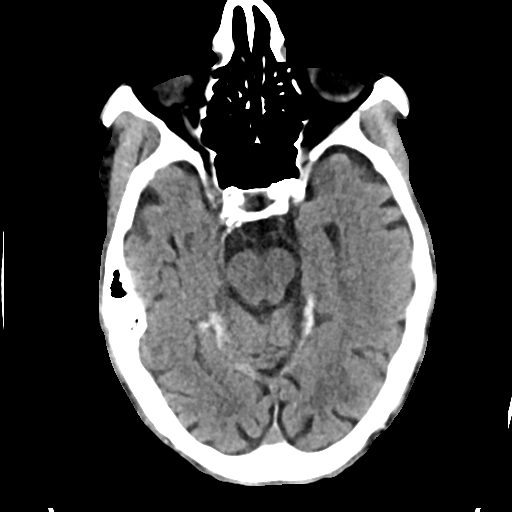
[im 17/34  brain]
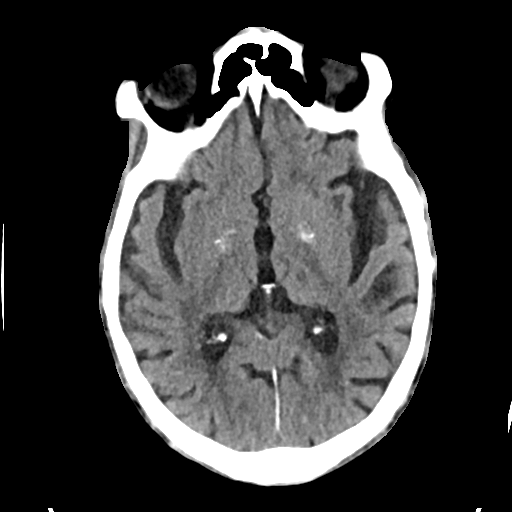
[im 21/34  brain]
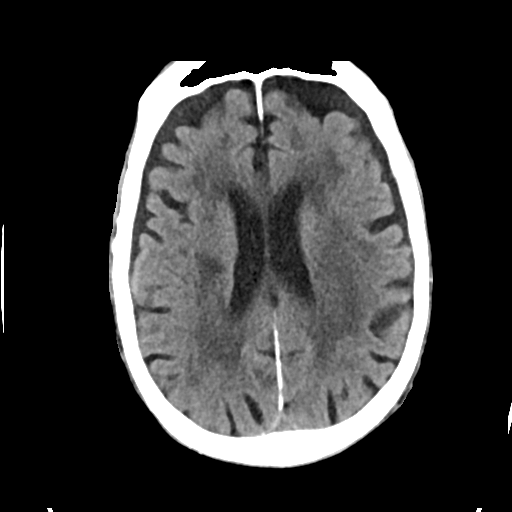
[im 21/34  bone]
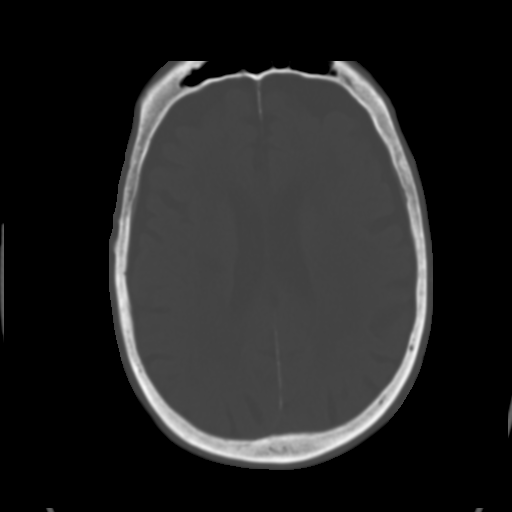
[im 25/34  brain]
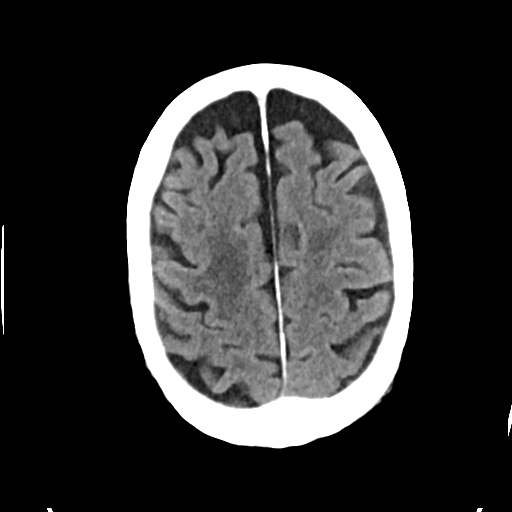
[im 29/34  brain]
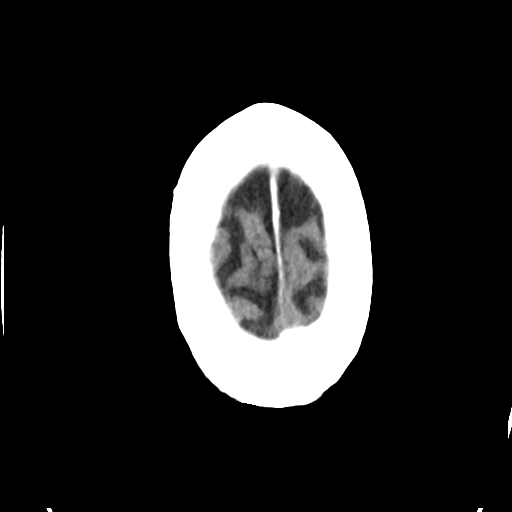

[Series 4: head bone · axial · 0.43mm/px · z∈[-82,-48]mm · 3 of 85 slices shown]
[im 9/85  bone]
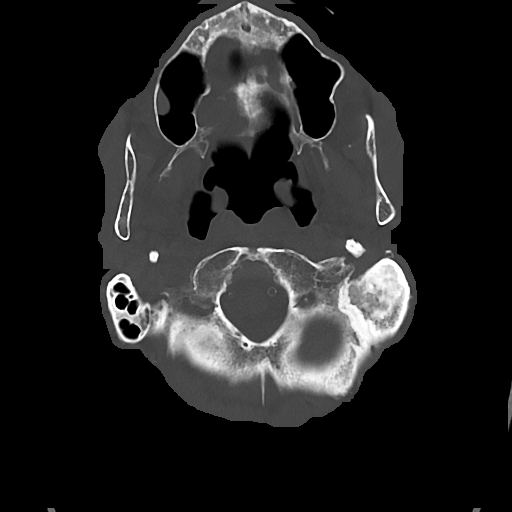
[im 17/85  bone]
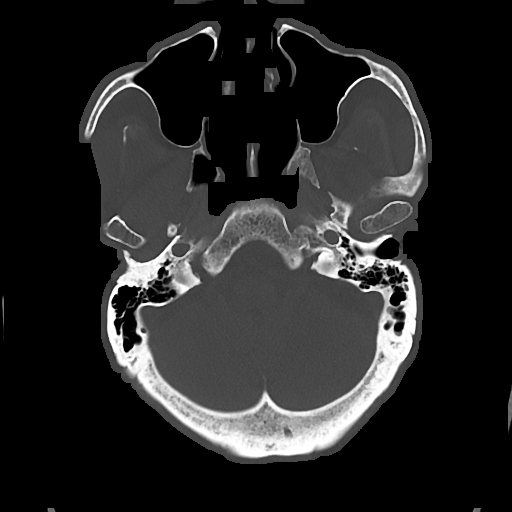
[im 26/85  bone]
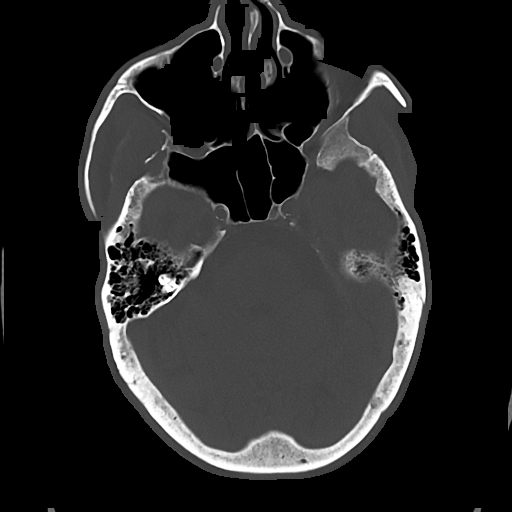

[Series 5: cor soft · coronal · 0.33mm/px · 3 of 70 slices shown]
[im 24/70  brain]
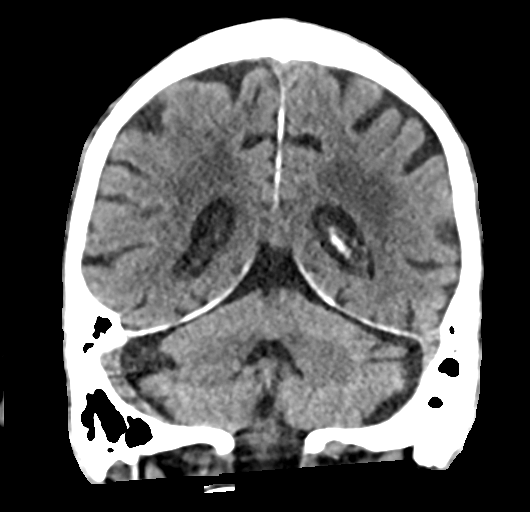
[im 31/70  brain]
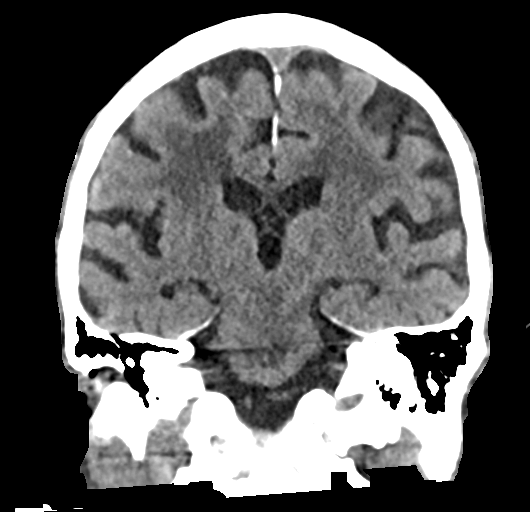
[im 39/70  brain]
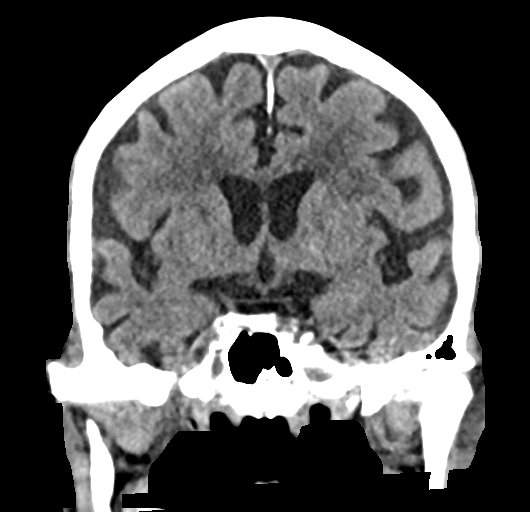

[Series 6: sag soft · sagittal · 0.33mm/px · 3 of 54 slices shown]
[im 18/54  brain]
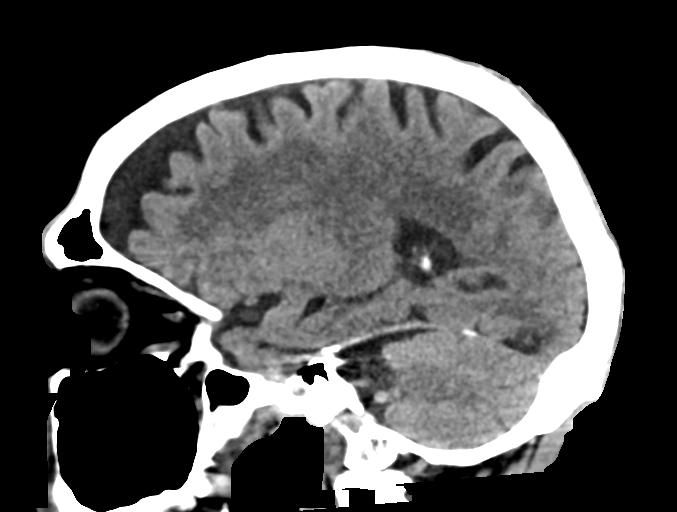
[im 27/54  brain]
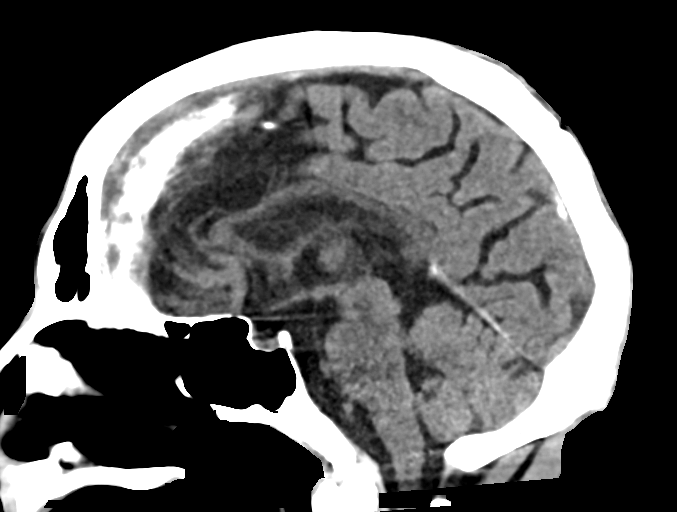
[im 36/54  brain]
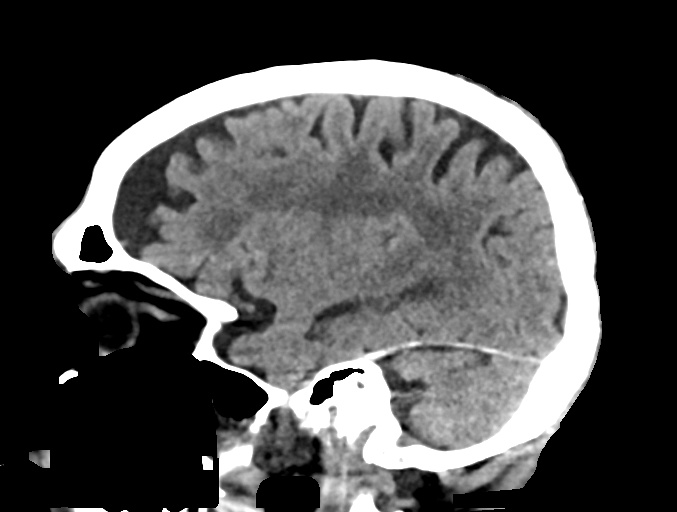

[16 of 47 positions shown; findings below may reference images not displayed]

FINDINGS: Brain: No evidence of acute infarction, hemorrhage, hydrocephalus,
extra-axial collection or mass lesion/mass effect. Generalized
atrophy. Extensive chronic small vessel ischemic change in the
cerebral white matter. Remote lenticulostriate infarct in the right
basal ganglia. Small remote bilateral cerebellar infarcts.

Vascular: Atherosclerotic calcification

Skull: Negative for fracture or bone lesion.

Sinuses/Orbits: Left cataract resection
IMPRESSION: 1. No acute finding.
2. Atrophy and extensive chronic small vessel ischemia.
